# Patient Record
Sex: Male | Born: 1960 | Race: White | Hispanic: No | Smoking: Never smoker
Health system: Southern US, Community
[De-identification: ages and names within clinical notes are randomized; demographics above are authoritative.]

## PROBLEM LIST (undated history)

## (undated) DIAGNOSIS — J189 Pneumonia, unspecified organism: Secondary | ICD-10-CM

## (undated) DIAGNOSIS — K7290 Hepatic failure, unspecified without coma: Secondary | ICD-10-CM

## (undated) DIAGNOSIS — L0390 Cellulitis, unspecified: Secondary | ICD-10-CM

## (undated) DIAGNOSIS — K7460 Unspecified cirrhosis of liver: Secondary | ICD-10-CM

## (undated) DIAGNOSIS — K3189 Other diseases of stomach and duodenum: Secondary | ICD-10-CM

## (undated) DIAGNOSIS — D696 Thrombocytopenia, unspecified: Secondary | ICD-10-CM

## (undated) DIAGNOSIS — R569 Unspecified convulsions: Secondary | ICD-10-CM

## (undated) DIAGNOSIS — I872 Venous insufficiency (chronic) (peripheral): Secondary | ICD-10-CM

## (undated) DIAGNOSIS — N179 Acute kidney failure, unspecified: Secondary | ICD-10-CM

## (undated) DIAGNOSIS — F418 Other specified anxiety disorders: Secondary | ICD-10-CM

## (undated) DIAGNOSIS — S065X9A Traumatic subdural hemorrhage with loss of consciousness of unspecified duration, initial encounter: Secondary | ICD-10-CM

## (undated) DIAGNOSIS — N200 Calculus of kidney: Secondary | ICD-10-CM

## (undated) DIAGNOSIS — D689 Coagulation defect, unspecified: Secondary | ICD-10-CM

## (undated) DIAGNOSIS — Z9289 Personal history of other medical treatment: Secondary | ICD-10-CM

## (undated) DIAGNOSIS — K766 Portal hypertension: Secondary | ICD-10-CM

## (undated) DIAGNOSIS — B1920 Unspecified viral hepatitis C without hepatic coma: Secondary | ICD-10-CM

## (undated) DIAGNOSIS — R188 Other ascites: Secondary | ICD-10-CM

## (undated) DIAGNOSIS — D649 Anemia, unspecified: Secondary | ICD-10-CM

## (undated) DIAGNOSIS — K922 Gastrointestinal hemorrhage, unspecified: Secondary | ICD-10-CM

## (undated) HISTORY — PX: ABDOMINAL HERNIA REPAIR: SHX539

## (undated) HISTORY — PX: TONSILLECTOMY: SUR1361

## (undated) HISTORY — PX: APPENDECTOMY: SHX54

---

## 2003-05-03 ENCOUNTER — Emergency Department (HOSPITAL_COMMUNITY): Admission: EM | Admit: 2003-05-03 | Discharge: 2003-05-04 | Payer: Self-pay | Admitting: Emergency Medicine

## 2003-11-29 ENCOUNTER — Emergency Department (HOSPITAL_COMMUNITY): Admission: EM | Admit: 2003-11-29 | Discharge: 2003-11-29 | Payer: Self-pay | Admitting: Family Medicine

## 2011-03-17 DIAGNOSIS — K3189 Other diseases of stomach and duodenum: Secondary | ICD-10-CM

## 2011-03-17 DIAGNOSIS — K922 Gastrointestinal hemorrhage, unspecified: Secondary | ICD-10-CM

## 2011-03-17 DIAGNOSIS — D689 Coagulation defect, unspecified: Secondary | ICD-10-CM

## 2011-03-17 DIAGNOSIS — D696 Thrombocytopenia, unspecified: Secondary | ICD-10-CM

## 2011-03-17 HISTORY — DX: Coagulation defect, unspecified: D68.9

## 2011-03-17 HISTORY — DX: Gastrointestinal hemorrhage, unspecified: K92.2

## 2011-03-17 HISTORY — DX: Thrombocytopenia, unspecified: D69.6

## 2011-03-17 HISTORY — DX: Other diseases of stomach and duodenum: K31.89

## 2011-04-08 LAB — TROPONIN I: Troponin-I: 0.02 ng/mL

## 2011-04-08 LAB — CBC
MCH: 33.8 pg (ref 26.0–34.0)
MCV: 99 fL (ref 80–100)
Platelet: 103 10*3/uL — ABNORMAL LOW (ref 150–440)
RBC: 3.4 10*6/uL — ABNORMAL LOW (ref 4.40–5.90)
RDW: 17.7 % — ABNORMAL HIGH (ref 11.5–14.5)

## 2011-04-08 LAB — COMPREHENSIVE METABOLIC PANEL
Alkaline Phosphatase: 93 U/L (ref 50–136)
BUN: 15 mg/dL (ref 7–18)
Bilirubin,Total: 2.3 mg/dL — ABNORMAL HIGH (ref 0.2–1.0)
Calcium, Total: 7.5 mg/dL — ABNORMAL LOW (ref 8.5–10.1)
Chloride: 108 mmol/L — ABNORMAL HIGH (ref 98–107)
Co2: 19 mmol/L — ABNORMAL LOW (ref 21–32)
Creatinine: 1.89 mg/dL — ABNORMAL HIGH (ref 0.60–1.30)
EGFR (African American): 48 — ABNORMAL LOW
EGFR (Non-African Amer.): 40 — ABNORMAL LOW
Glucose: 99 mg/dL (ref 65–99)
Osmolality: 286 (ref 275–301)
SGOT(AST): 310 U/L — ABNORMAL HIGH (ref 15–37)
SGPT (ALT): 138 U/L — ABNORMAL HIGH
Sodium: 143 mmol/L (ref 136–145)

## 2011-04-08 LAB — LIPASE, BLOOD: Lipase: 356 U/L (ref 73–393)

## 2011-04-08 LAB — PROTIME-INR
INR: 1.4
Prothrombin Time: 17.4 secs — ABNORMAL HIGH (ref 11.5–14.7)

## 2011-04-09 ENCOUNTER — Inpatient Hospital Stay: Payer: Self-pay | Admitting: Internal Medicine

## 2011-04-09 LAB — URINALYSIS, COMPLETE
Bacteria: NONE SEEN
Bilirubin,UR: NEGATIVE
Blood: NEGATIVE
Glucose,UR: NEGATIVE mg/dL (ref 0–75)
Leukocyte Esterase: NEGATIVE
Nitrite: NEGATIVE
Ph: 5 (ref 4.5–8.0)
Protein: NEGATIVE
RBC,UR: NONE SEEN /HPF (ref 0–5)
Squamous Epithelial: 2

## 2011-04-09 LAB — HEMOGLOBIN
HGB: 8.2 g/dL — ABNORMAL LOW (ref 13.0–18.0)
HGB: 7.4 g/dL — ABNORMAL LOW (ref 13.0–18.0)
HGB: 8.7 g/dL — ABNORMAL LOW (ref 13.0–18.0)

## 2011-04-09 LAB — BASIC METABOLIC PANEL
Anion Gap: 11 (ref 7–16)
Calcium, Total: 6.1 mg/dL — CL (ref 8.5–10.1)
Co2: 19 mmol/L — ABNORMAL LOW (ref 21–32)
Creatinine: 1.48 mg/dL — ABNORMAL HIGH (ref 0.60–1.30)
EGFR (African American): >60
EGFR (Non-African Amer.): 53 — ABNORMAL LOW
Glucose: 82 mg/dL (ref 65–99)

## 2011-04-09 LAB — ETHANOL: Ethanol: 38 mg/dL

## 2011-04-10 HISTORY — PX: ESOPHAGOGASTRODUODENOSCOPY: SHX1529

## 2011-04-10 LAB — COMPREHENSIVE METABOLIC PANEL
Albumin: 2.2 g/dL — ABNORMAL LOW (ref 3.4–5.0)
Alkaline Phosphatase: 70 U/L (ref 50–136)
BUN: 18 mg/dL (ref 7–18)
Bilirubin,Total: 4.6 mg/dL — ABNORMAL HIGH (ref 0.2–1.0)
Calcium, Total: 6.6 mg/dL — CL (ref 8.5–10.1)
Chloride: 113 mmol/L — ABNORMAL HIGH (ref 98–107)
Co2: 25 mmol/L (ref 21–32)
Creatinine: 0.99 mg/dL (ref 0.60–1.30)
EGFR (African American): >60
EGFR (Non-African Amer.): >60
Glucose: 116 mg/dL — ABNORMAL HIGH (ref 65–99)
Osmolality: 293 (ref 275–301)
Potassium: 4.1 mmol/L (ref 3.5–5.1)
SGPT (ALT): 103 U/L — ABNORMAL HIGH

## 2011-05-11 ENCOUNTER — Ambulatory Visit: Payer: Self-pay | Admitting: Gastroenterology

## 2011-05-31 ENCOUNTER — Ambulatory Visit: Payer: Self-pay | Admitting: Gastroenterology

## 2011-09-27 ENCOUNTER — Ambulatory Visit: Payer: Self-pay | Admitting: Gastroenterology

## 2012-01-17 DIAGNOSIS — R188 Other ascites: Secondary | ICD-10-CM

## 2012-01-17 HISTORY — DX: Other ascites: R18.8

## 2012-03-28 ENCOUNTER — Emergency Department: Payer: Self-pay | Admitting: Emergency Medicine

## 2012-03-28 LAB — RAPID INFLUENZA A&B ANTIGENS

## 2012-09-18 ENCOUNTER — Inpatient Hospital Stay: Payer: Self-pay | Admitting: Specialist

## 2012-09-18 LAB — COMPREHENSIVE METABOLIC PANEL
Albumin: 2.5 g/dL — ABNORMAL LOW (ref 3.4–5.0)
Alkaline Phosphatase: 179 U/L — ABNORMAL HIGH (ref 50–136)
BUN: 10 mg/dL (ref 7–18)
Bilirubin,Total: 3.1 mg/dL — ABNORMAL HIGH (ref 0.2–1.0)
Chloride: 109 mmol/L — ABNORMAL HIGH (ref 98–107)
EGFR (African American): >60
EGFR (Non-African Amer.): >60
Osmolality: 279 (ref 275–301)
Potassium: 4.3 mmol/L (ref 3.5–5.1)
Sodium: 140 mmol/L (ref 136–145)
Total Protein: 5.9 g/dL — ABNORMAL LOW (ref 6.4–8.2)

## 2012-09-18 LAB — CBC WITH DIFFERENTIAL/PLATELET
Basophil #: 0.1 10*3/uL (ref 0.0–0.1)
Basophil %: 1.3 %
Eosinophil %: 3.2 %
HGB: 13.3 g/dL (ref 13.0–18.0)
Lymphocyte #: 1.1 10*3/uL (ref 1.0–3.6)
MCH: 35.1 pg — ABNORMAL HIGH (ref 26.0–34.0)
MCV: 102 fL — ABNORMAL HIGH (ref 80–100)
Monocyte %: 18.4 %
Neutrophil #: 1.8 10*3/uL (ref 1.4–6.5)
Neutrophil %: 48.8 %
Platelet: 39 10*3/uL — ABNORMAL LOW (ref 150–440)
RBC: 3.8 10*6/uL — ABNORMAL LOW (ref 4.40–5.90)
RDW: 18.1 % — ABNORMAL HIGH (ref 11.5–14.5)
WBC: 3.8 10*3/uL (ref 3.8–10.6)

## 2012-09-18 LAB — URINALYSIS, COMPLETE
Bacteria: NONE SEEN
Hyaline Cast: 2
Ketone: NEGATIVE
Leukocyte Esterase: NEGATIVE
Ph: 6 (ref 4.5–8.0)
Protein: NEGATIVE
Specific Gravity: 1.02 (ref 1.003–1.030)
WBC UR: <1 /HPF (ref 0–5)

## 2012-09-19 LAB — RAPID HIV-1/2 QL/CONFIRM: HIV-1/2,Rapid Ql: NEGATIVE

## 2012-09-19 LAB — TSH: Thyroid Stimulating Horm: 2.35 u[IU]/mL

## 2012-09-20 LAB — COMPREHENSIVE METABOLIC PANEL
Albumin: 2.3 g/dL — ABNORMAL LOW (ref 3.4–5.0)
Alkaline Phosphatase: 142 U/L — ABNORMAL HIGH (ref 50–136)
Anion Gap: 5 — ABNORMAL LOW (ref 7–16)
BUN: 10 mg/dL (ref 7–18)
Co2: 25 mmol/L (ref 21–32)
Creatinine: 0.85 mg/dL (ref 0.60–1.30)
EGFR (African American): >60
EGFR (Non-African Amer.): >60
Glucose: 82 mg/dL (ref 65–99)
Osmolality: 274 (ref 275–301)
SGOT(AST): 56 U/L — ABNORMAL HIGH (ref 15–37)
Sodium: 138 mmol/L (ref 136–145)
Total Protein: 5.2 g/dL — ABNORMAL LOW (ref 6.4–8.2)

## 2012-09-23 LAB — CULTURE, BLOOD (SINGLE)

## 2012-09-24 ENCOUNTER — Emergency Department: Payer: Self-pay | Admitting: Emergency Medicine

## 2012-09-24 LAB — URINALYSIS, COMPLETE
Blood: NEGATIVE
Glucose,UR: NEGATIVE mg/dL (ref 0–75)
Hyaline Cast: 19
Ketone: NEGATIVE
Leukocyte Esterase: NEGATIVE
Ph: 5 (ref 4.5–8.0)
RBC,UR: 1 /HPF (ref 0–5)
Specific Gravity: 1.026 (ref 1.003–1.030)
Squamous Epithelial: NONE SEEN
WBC UR: <1 /HPF (ref 0–5)

## 2012-09-24 LAB — AMMONIA: Ammonia, Plasma: <25 mcmol/L (ref 11–32)

## 2012-09-24 LAB — DRUG SCREEN, URINE
Barbiturates, Ur Screen: NEGATIVE (ref ?–200)
Benzodiazepine, Ur Scrn: NEGATIVE (ref ?–200)
Cannabinoid 50 Ng, Ur ~~LOC~~: POSITIVE (ref ?–50)
MDMA (Ecstasy)Ur Screen: NEGATIVE (ref ?–500)
Methadone, Ur Screen: NEGATIVE (ref ?–300)
Opiate, Ur Screen: NEGATIVE (ref ?–300)
Phencyclidine (PCP) Ur S: NEGATIVE (ref ?–25)
Tricyclic, Ur Screen: NEGATIVE (ref ?–1000)

## 2012-09-24 LAB — COMPREHENSIVE METABOLIC PANEL
Albumin: 3.2 g/dL — ABNORMAL LOW (ref 3.4–5.0)
Alkaline Phosphatase: 177 U/L — ABNORMAL HIGH (ref 50–136)
Anion Gap: 7 (ref 7–16)
Creatinine: 0.8 mg/dL (ref 0.60–1.30)
EGFR (African American): >60
EGFR (Non-African Amer.): >60
Glucose: 70 mg/dL (ref 65–99)
SGOT(AST): 124 U/L — ABNORMAL HIGH (ref 15–37)
Sodium: 140 mmol/L (ref 136–145)
Total Protein: 6.5 g/dL (ref 6.4–8.2)

## 2012-09-24 LAB — CBC
HCT: 38.3 % — ABNORMAL LOW (ref 40.0–52.0)
HGB: 13 g/dL (ref 13.0–18.0)
MCH: 34.6 pg — ABNORMAL HIGH (ref 26.0–34.0)
MCHC: 33.8 g/dL (ref 32.0–36.0)
MCV: 102 fL — ABNORMAL HIGH (ref 80–100)
Platelet: 40 10*3/uL — ABNORMAL LOW (ref 150–440)
RDW: 18.1 % — ABNORMAL HIGH (ref 11.5–14.5)
WBC: 4.5 10*3/uL (ref 3.8–10.6)

## 2012-09-24 LAB — TSH: Thyroid Stimulating Horm: 4.37 u[IU]/mL

## 2012-09-24 LAB — ETHANOL: Ethanol: <3 mg/dL

## 2012-09-25 LAB — DRUG SCREEN, URINE
Barbiturates, Ur Screen: NEGATIVE (ref ?–200)
Cannabinoid 50 Ng, Ur ~~LOC~~: POSITIVE (ref ?–50)
MDMA (Ecstasy)Ur Screen: NEGATIVE (ref ?–500)
Methadone, Ur Screen: NEGATIVE (ref ?–300)
Phencyclidine (PCP) Ur S: NEGATIVE (ref ?–25)

## 2012-09-25 LAB — COMPREHENSIVE METABOLIC PANEL
Albumin: 3.1 g/dL — ABNORMAL LOW (ref 3.4–5.0)
Alkaline Phosphatase: 163 U/L — ABNORMAL HIGH (ref 50–136)
Anion Gap: 8 (ref 7–16)
BUN: 15 mg/dL (ref 7–18)
Chloride: 108 mmol/L — ABNORMAL HIGH (ref 98–107)
Co2: 24 mmol/L (ref 21–32)
Creatinine: 1.24 mg/dL (ref 0.60–1.30)
EGFR (African American): >60
EGFR (Non-African Amer.): >60
Glucose: 57 mg/dL — ABNORMAL LOW (ref 65–99)
Potassium: 4.1 mmol/L (ref 3.5–5.1)
SGPT (ALT): 52 U/L (ref 12–78)
Sodium: 140 mmol/L (ref 136–145)

## 2012-09-25 LAB — CBC
HGB: 12.2 g/dL — ABNORMAL LOW (ref 13.0–18.0)
MCH: 35.1 pg — ABNORMAL HIGH (ref 26.0–34.0)
MCHC: 34.7 g/dL (ref 32.0–36.0)
MCV: 101 fL — ABNORMAL HIGH (ref 80–100)
Platelet: 40 10*3/uL — ABNORMAL LOW (ref 150–440)
RDW: 17.8 % — ABNORMAL HIGH (ref 11.5–14.5)
WBC: 4.5 10*3/uL (ref 3.8–10.6)

## 2012-09-25 LAB — URINALYSIS, COMPLETE
Bacteria: NONE SEEN
Hyaline Cast: 51
Leukocyte Esterase: NEGATIVE
Ph: 5 (ref 4.5–8.0)
RBC,UR: 2 /HPF (ref 0–5)
Specific Gravity: 1.025 (ref 1.003–1.030)
Squamous Epithelial: NONE SEEN

## 2012-09-25 LAB — BILIRUBIN, DIRECT: Bilirubin, Direct: 2.3 mg/dL — ABNORMAL HIGH (ref 0.00–0.20)

## 2012-09-25 LAB — TSH: Thyroid Stimulating Horm: 2.93 u[IU]/mL

## 2012-09-25 LAB — AMMONIA: Ammonia, Plasma: <25 mcmol/L (ref 11–32)

## 2012-09-26 ENCOUNTER — Inpatient Hospital Stay: Payer: Self-pay | Admitting: Internal Medicine

## 2012-09-28 LAB — BASIC METABOLIC PANEL
Anion Gap: 5 — ABNORMAL LOW (ref 7–16)
BUN: 10 mg/dL (ref 7–18)
Calcium, Total: 8.2 mg/dL — ABNORMAL LOW (ref 8.5–10.1)
Co2: 26 mmol/L (ref 21–32)
EGFR (African American): >60
EGFR (Non-African Amer.): >60
Glucose: 74 mg/dL (ref 65–99)
Osmolality: 283 (ref 275–301)
Sodium: 143 mmol/L (ref 136–145)

## 2012-09-28 LAB — CBC WITH DIFFERENTIAL/PLATELET
Basophil #: 0 10*3/uL (ref 0.0–0.1)
Eosinophil %: 4.5 %
HCT: 33.7 % — ABNORMAL LOW (ref 40.0–52.0)
Lymphocyte %: 37.8 %
MCHC: 34 g/dL (ref 32.0–36.0)
Monocyte #: 0.6 x10 3/mm (ref 0.2–1.0)
Monocyte %: 18.8 %
RBC: 3.27 10*6/uL — ABNORMAL LOW (ref 4.40–5.90)

## 2012-09-29 LAB — CBC WITH DIFFERENTIAL/PLATELET
Basophil %: 1.1 %
Eosinophil #: 0.1 10*3/uL (ref 0.0–0.7)
Eosinophil %: 4.5 %
HCT: 36.5 % — ABNORMAL LOW (ref 40.0–52.0)
HGB: 12.3 g/dL — ABNORMAL LOW (ref 13.0–18.0)
Lymphocyte #: 1.3 10*3/uL (ref 1.0–3.6)
MCH: 34.6 pg — ABNORMAL HIGH (ref 26.0–34.0)
MCHC: 33.7 g/dL (ref 32.0–36.0)
MCV: 103 fL — ABNORMAL HIGH (ref 80–100)
Monocyte #: 0.5 x10 3/mm (ref 0.2–1.0)
Monocyte %: 17.1 %
Neutrophil #: 1.1 10*3/uL — ABNORMAL LOW (ref 1.4–6.5)
Neutrophil %: 35.6 %
Platelet: 32 10*3/uL — ABNORMAL LOW (ref 150–440)
RBC: 3.55 10*6/uL — ABNORMAL LOW (ref 4.40–5.90)
RDW: 17.3 % — ABNORMAL HIGH (ref 11.5–14.5)
WBC: 3.2 10*3/uL — ABNORMAL LOW (ref 3.8–10.6)

## 2012-09-29 LAB — BASIC METABOLIC PANEL
Anion Gap: 7 (ref 7–16)
BUN: 11 mg/dL (ref 7–18)
Calcium, Total: 8.4 mg/dL — ABNORMAL LOW (ref 8.5–10.1)
Chloride: 110 mmol/L — ABNORMAL HIGH (ref 98–107)
Creatinine: 0.8 mg/dL (ref 0.60–1.30)
EGFR (African American): >60
EGFR (Non-African Amer.): >60
Osmolality: 282 (ref 275–301)
Potassium: 4.1 mmol/L (ref 3.5–5.1)
Sodium: 143 mmol/L (ref 136–145)

## 2012-10-01 LAB — AMMONIA: Ammonia, Plasma: 28 mcmol/L (ref 11–32)

## 2012-10-08 ENCOUNTER — Emergency Department: Payer: Self-pay | Admitting: Emergency Medicine

## 2012-10-08 LAB — CBC
HCT: 34.6 % — ABNORMAL LOW (ref 40.0–52.0)
MCH: 34.8 pg — ABNORMAL HIGH (ref 26.0–34.0)
MCHC: 34.7 g/dL (ref 32.0–36.0)
Platelet: 45 10*3/uL — ABNORMAL LOW (ref 150–440)
RBC: 3.45 10*6/uL — ABNORMAL LOW (ref 4.40–5.90)
RDW: 18 % — ABNORMAL HIGH (ref 11.5–14.5)
WBC: 3.9 10*3/uL (ref 3.8–10.6)

## 2012-10-08 LAB — DRUG SCREEN, URINE
Amphetamines, Ur Screen: NEGATIVE (ref ?–1000)
Benzodiazepine, Ur Scrn: NEGATIVE (ref ?–200)
Cannabinoid 50 Ng, Ur ~~LOC~~: POSITIVE (ref ?–50)
Cocaine Metabolite,Ur ~~LOC~~: NEGATIVE (ref ?–300)
Methadone, Ur Screen: NEGATIVE (ref ?–300)
Phencyclidine (PCP) Ur S: NEGATIVE (ref ?–25)
Tricyclic, Ur Screen: NEGATIVE (ref ?–1000)

## 2012-10-08 LAB — URINALYSIS, COMPLETE
Blood: NEGATIVE
Glucose,UR: NEGATIVE mg/dL (ref 0–75)
Hyaline Cast: 6
Leukocyte Esterase: NEGATIVE
Nitrite: NEGATIVE
Ph: 5 (ref 4.5–8.0)
Protein: 30
Specific Gravity: 1.028 (ref 1.003–1.030)
Squamous Epithelial: <1
WBC UR: 2 /HPF (ref 0–5)

## 2012-10-08 LAB — COMPREHENSIVE METABOLIC PANEL
Albumin: 2.6 g/dL — ABNORMAL LOW (ref 3.4–5.0)
Alkaline Phosphatase: 164 U/L — ABNORMAL HIGH (ref 50–136)
Anion Gap: 7 (ref 7–16)
BUN: 13 mg/dL (ref 7–18)
Bilirubin,Total: 3.6 mg/dL — ABNORMAL HIGH (ref 0.2–1.0)
Calcium, Total: 8.5 mg/dL (ref 8.5–10.1)
Chloride: 109 mmol/L — ABNORMAL HIGH (ref 98–107)
EGFR (African American): >60
EGFR (Non-African Amer.): >60
Glucose: 127 mg/dL — ABNORMAL HIGH (ref 65–99)
Osmolality: 281 (ref 275–301)
Potassium: 3.7 mmol/L (ref 3.5–5.1)
SGOT(AST): 88 U/L — ABNORMAL HIGH (ref 15–37)

## 2012-10-08 LAB — ETHANOL
Ethanol %: <0.003 % (ref 0.000–0.080)
Ethanol: <3 mg/dL

## 2012-10-08 LAB — TSH: Thyroid Stimulating Horm: 2.32 u[IU]/mL

## 2012-10-08 LAB — ACETAMINOPHEN LEVEL: Acetaminophen: 3 ug/mL — ABNORMAL LOW

## 2012-10-08 LAB — SALICYLATE LEVEL: Salicylates, Serum: <1.7 mg/dL

## 2013-01-16 DIAGNOSIS — R569 Unspecified convulsions: Secondary | ICD-10-CM

## 2013-01-16 HISTORY — DX: Unspecified convulsions: R56.9

## 2013-02-21 DIAGNOSIS — B1920 Unspecified viral hepatitis C without hepatic coma: Secondary | ICD-10-CM | POA: Insufficient documentation

## 2013-02-21 DIAGNOSIS — K7460 Unspecified cirrhosis of liver: Secondary | ICD-10-CM | POA: Insufficient documentation

## 2013-07-20 ENCOUNTER — Emergency Department (HOSPITAL_COMMUNITY)
Admission: EM | Admit: 2013-07-20 | Discharge: 2013-07-21 | Disposition: A | Discharge status: Home / Self Care | Payer: Medicaid Other | Attending: Emergency Medicine | Admitting: Emergency Medicine

## 2013-07-20 ENCOUNTER — Encounter (HOSPITAL_COMMUNITY): Payer: Self-pay | Admitting: Emergency Medicine

## 2013-07-20 DIAGNOSIS — E870 Hyperosmolality and hypernatremia: Secondary | ICD-10-CM | POA: Diagnosis not present

## 2013-07-20 DIAGNOSIS — F1010 Alcohol abuse, uncomplicated: Secondary | ICD-10-CM | POA: Insufficient documentation

## 2013-07-20 DIAGNOSIS — F10920 Alcohol use, unspecified with intoxication, uncomplicated: Secondary | ICD-10-CM

## 2013-07-20 HISTORY — DX: Unspecified cirrhosis of liver: K74.60

## 2013-07-20 LAB — I-STAT CHEM 8, ED
BUN: 4 mg/dL — AB (ref 6–23)
BUN: 4 mg/dL — ABNORMAL LOW (ref 6–23)
CALCIUM ION: 1.04 mmol/L — AB (ref 1.12–1.23)
CHLORIDE: 104 meq/L (ref 96–112)
CREATININE: 1.1 mg/dL (ref 0.50–1.35)
Calcium, Ion: 1.06 mmol/L — ABNORMAL LOW (ref 1.12–1.23)
Chloride: 106 mEq/L (ref 96–112)
Creatinine, Ser: 1.2 mg/dL (ref 0.50–1.35)
GLUCOSE: 154 mg/dL — AB (ref 70–99)
Glucose, Bld: 88 mg/dL (ref 70–99)
HCT: 41 % (ref 39.0–52.0)
HCT: 40 % (ref 39.0–52.0)
HEMOGLOBIN: 13.9 g/dL (ref 13.0–17.0)
Hemoglobin: 13.6 g/dL (ref 13.0–17.0)
POTASSIUM: 3.8 meq/L (ref 3.7–5.3)
Potassium: 4.5 mEq/L (ref 3.7–5.3)
SODIUM: 150 meq/L — AB (ref 137–147)
Sodium: 150 mEq/L — ABNORMAL HIGH (ref 137–147)
TCO2: 25 mmol/L (ref 0–100)
TCO2: 29 mmol/L (ref 0–100)

## 2013-07-20 LAB — ETHANOL
ALCOHOL ETHYL (B): 371 mg/dL — AB (ref 0–11)
ALCOHOL ETHYL (B): 326 mg/dL — AB (ref 0–11)

## 2013-07-20 MED ORDER — SODIUM CHLORIDE 0.9 % IV BOLUS (SEPSIS)
1000.0000 mL | Freq: Once | INTRAVENOUS | Status: AC
  Administered 2013-07-20: 1000 mL via INTRAVENOUS

## 2013-07-20 MED ORDER — DEXTROSE-NACL 5-0.45 % IV SOLN
INTRAVENOUS | Status: DC
  Administered 2013-07-20: 23:00:00 via INTRAVENOUS
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
## 2013-07-20 NOTE — ED Notes (Signed)
Pt states he drank several beers at noon today

## 2013-07-20 NOTE — ED Notes (Signed)
Pt resting with eye closed, RR even and unlabored.

## 2013-07-20 NOTE — ED Notes (Signed)
Pt ambulatory with 1 person assist to BR, pt sat up and ate sandwich and cheese, consumed soft drink with no difficulties
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
## 2013-07-21 NOTE — ED Notes (Signed)
Pt was unable to find anyone to come and get him until morning. Pt has eaten twice and had several beverages in last few hours. Pt can stay in [facility] until he is able to find ride home. Charge nurse aware.

## 2013-07-21 NOTE — Discharge Instructions (Signed)
Return to the ED with any concerns including vomiting and not able to keep down liquids, difficulty breathing, decreased level of alertness/lethargy, or any other alarming symptoms

## 2013-10-16 DIAGNOSIS — S065X9A Traumatic subdural hemorrhage with loss of consciousness of unspecified duration, initial encounter: Secondary | ICD-10-CM

## 2013-10-16 DIAGNOSIS — S065XAA Traumatic subdural hemorrhage with loss of consciousness status unknown, initial encounter: Secondary | ICD-10-CM

## 2013-10-16 DIAGNOSIS — K7290 Hepatic failure, unspecified without coma: Secondary | ICD-10-CM

## 2013-10-16 DIAGNOSIS — K7682 Hepatic encephalopathy: Secondary | ICD-10-CM

## 2013-10-16 HISTORY — DX: Hepatic encephalopathy: K76.82

## 2013-10-16 HISTORY — DX: Traumatic subdural hemorrhage with loss of consciousness of unspecified duration, initial encounter: S06.5X9A

## 2013-10-16 HISTORY — DX: Hepatic failure, unspecified without coma: K72.90

## 2013-10-16 HISTORY — DX: Traumatic subdural hemorrhage with loss of consciousness status unknown, initial encounter: S06.5XAA

## 2013-10-21 ENCOUNTER — Inpatient Hospital Stay (HOSPITAL_COMMUNITY)
Admission: EM | Admit: 2013-10-21 | Discharge: 2013-10-25 | DRG: 025 | Disposition: A | Discharge status: Home / Self Care | Payer: Medicaid Other | Source: Other Acute Inpatient Hospital | Attending: Internal Medicine | Admitting: Internal Medicine

## 2013-10-21 ENCOUNTER — Encounter (HOSPITAL_COMMUNITY)
Admission: EM | Disposition: A | Discharge status: Home / Self Care | Payer: Self-pay | Source: Other Acute Inpatient Hospital | Attending: Internal Medicine

## 2013-10-21 ENCOUNTER — Encounter (HOSPITAL_COMMUNITY): Payer: Self-pay | Admitting: General Practice

## 2013-10-21 ENCOUNTER — Inpatient Hospital Stay (HOSPITAL_COMMUNITY): Payer: Medicaid Other

## 2013-10-21 ENCOUNTER — Inpatient Hospital Stay (HOSPITAL_COMMUNITY): Payer: Medicaid Other | Admitting: Anesthesiology

## 2013-10-21 ENCOUNTER — Encounter (HOSPITAL_COMMUNITY): Payer: Medicaid Other | Admitting: Anesthesiology

## 2013-10-21 ENCOUNTER — Emergency Department: Payer: Self-pay | Admitting: Emergency Medicine

## 2013-10-21 DIAGNOSIS — E870 Hyperosmolality and hypernatremia: Secondary | ICD-10-CM | POA: Diagnosis present

## 2013-10-21 DIAGNOSIS — R001 Bradycardia, unspecified: Secondary | ICD-10-CM | POA: Diagnosis present

## 2013-10-21 DIAGNOSIS — K7460 Unspecified cirrhosis of liver: Secondary | ICD-10-CM

## 2013-10-21 DIAGNOSIS — F1020 Alcohol dependence, uncomplicated: Secondary | ICD-10-CM | POA: Diagnosis present

## 2013-10-21 DIAGNOSIS — E876 Hypokalemia: Secondary | ICD-10-CM | POA: Diagnosis not present

## 2013-10-21 DIAGNOSIS — K7030 Alcoholic cirrhosis of liver without ascites: Secondary | ICD-10-CM | POA: Diagnosis present

## 2013-10-21 DIAGNOSIS — S065XAA Traumatic subdural hemorrhage with loss of consciousness status unknown, initial encounter: Secondary | ICD-10-CM | POA: Diagnosis present

## 2013-10-21 DIAGNOSIS — R791 Abnormal coagulation profile: Secondary | ICD-10-CM | POA: Diagnosis present

## 2013-10-21 DIAGNOSIS — Y909 Presence of alcohol in blood, level not specified: Secondary | ICD-10-CM | POA: Diagnosis present

## 2013-10-21 DIAGNOSIS — I6200 Nontraumatic subdural hemorrhage, unspecified: Secondary | ICD-10-CM

## 2013-10-21 DIAGNOSIS — B1920 Unspecified viral hepatitis C without hepatic coma: Secondary | ICD-10-CM | POA: Diagnosis present

## 2013-10-21 DIAGNOSIS — D696 Thrombocytopenia, unspecified: Secondary | ICD-10-CM | POA: Diagnosis present

## 2013-10-21 DIAGNOSIS — S065X9A Traumatic subdural hemorrhage with loss of consciousness of unspecified duration, initial encounter: Secondary | ICD-10-CM | POA: Diagnosis present

## 2013-10-21 DIAGNOSIS — G92 Toxic encephalopathy: Secondary | ICD-10-CM | POA: Diagnosis present

## 2013-10-21 DIAGNOSIS — G8194 Hemiplegia, unspecified affecting left nondominant side: Secondary | ICD-10-CM | POA: Diagnosis present

## 2013-10-21 DIAGNOSIS — F1410 Cocaine abuse, uncomplicated: Secondary | ICD-10-CM | POA: Diagnosis present

## 2013-10-21 DIAGNOSIS — R739 Hyperglycemia, unspecified: Secondary | ICD-10-CM | POA: Diagnosis present

## 2013-10-21 HISTORY — PX: CRANIOTOMY: SHX93

## 2013-10-21 LAB — COMPREHENSIVE METABOLIC PANEL
ALT: 28 U/L (ref 0–53)
ANION GAP: 11 (ref 5–15)
AST: 62 U/L — ABNORMAL HIGH (ref 0–37)
Albumin: 2.7 g/dL — ABNORMAL LOW (ref 3.4–5.0)
Albumin: 2.6 g/dL — ABNORMAL LOW (ref 3.5–5.2)
Alkaline Phosphatase: 126 U/L — ABNORMAL HIGH
Alkaline Phosphatase: 110 U/L (ref 39–117)
Anion Gap: 4 — ABNORMAL LOW (ref 7–16)
BUN: 16 mg/dL (ref 7–18)
BUN: 14 mg/dL (ref 6–23)
Bilirubin,Total: 5.2 mg/dL — ABNORMAL HIGH (ref 0.2–1.0)
CALCIUM: 8.4 mg/dL — AB (ref 8.5–10.1)
CALCIUM: 8.4 mg/dL (ref 8.4–10.5)
CO2: 26 mEq/L (ref 19–32)
CREATININE: 0.73 mg/dL (ref 0.50–1.35)
Chloride: 106 mmol/L (ref 98–107)
Chloride: 104 mEq/L (ref 96–112)
Co2: 27 mmol/L (ref 21–32)
Creatinine: 0.91 mg/dL (ref 0.60–1.30)
EGFR (Non-African Amer.): >60
GFR calc non Af Amer: >90 mL/min (ref >90)
GLUCOSE: 75 mg/dL (ref 70–99)
Glucose: 74 mg/dL (ref 65–99)
OSMOLALITY: 274 (ref 275–301)
POTASSIUM: 3.6 mmol/L (ref 3.5–5.1)
Potassium: 4.3 mEq/L (ref 3.7–5.3)
SGOT(AST): 83 U/L — ABNORMAL HIGH (ref 15–37)
SGPT (ALT): 41 U/L
Sodium: 137 mmol/L (ref 136–145)
Sodium: 141 mEq/L (ref 137–147)
TOTAL PROTEIN: 6 g/dL (ref 6.0–8.3)
Total Bilirubin: 4.5 mg/dL — ABNORMAL HIGH (ref 0.3–1.2)
Total Protein: 6.1 g/dL — ABNORMAL LOW (ref 6.4–8.2)

## 2013-10-21 LAB — CBC WITH DIFFERENTIAL/PLATELET
Basophils Absolute: 0 10*3/uL (ref 0.0–0.1)
Basophils Relative: 0 % (ref 0–1)
EOS PCT: 6 % — AB (ref 0–5)
Eosinophils Absolute: 0.3 10*3/uL (ref 0.0–0.7)
HEMATOCRIT: 37.2 % — AB (ref 39.0–52.0)
Hemoglobin: 12.8 g/dL — ABNORMAL LOW (ref 13.0–17.0)
LYMPHS PCT: 36 % (ref 12–46)
Lymphs Abs: 2.1 10*3/uL (ref 0.7–4.0)
MCH: 34.3 pg — ABNORMAL HIGH (ref 26.0–34.0)
MCHC: 34.4 g/dL (ref 30.0–36.0)
MCV: 99.7 fL (ref 78.0–100.0)
MONO ABS: 1.3 10*3/uL — AB (ref 0.1–1.0)
Monocytes Relative: 23 % — ABNORMAL HIGH (ref 3–12)
Neutro Abs: 2.1 10*3/uL (ref 1.7–7.7)
Neutrophils Relative %: 35 % — ABNORMAL LOW (ref 43–77)
Platelets: 57 10*3/uL — ABNORMAL LOW (ref 150–400)
RBC: 3.73 MIL/uL — ABNORMAL LOW (ref 4.22–5.81)
RDW: 14.7 % (ref 11.5–15.5)
WBC: 5.9 10*3/uL (ref 4.0–10.5)

## 2013-10-21 LAB — CBC
HCT: 42.2 % (ref 40.0–52.0)
HEMATOCRIT: 30.7 % — AB (ref 39.0–52.0)
HEMOGLOBIN: 10.6 g/dL — AB (ref 13.0–17.0)
HGB: 13.6 g/dL (ref 13.0–18.0)
MCH: 34.1 pg — ABNORMAL HIGH (ref 26.0–34.0)
MCH: 34.2 pg — ABNORMAL HIGH (ref 26.0–34.0)
MCHC: 32.3 g/dL (ref 32.0–36.0)
MCHC: 34.5 g/dL (ref 30.0–36.0)
MCV: 106 fL — ABNORMAL HIGH (ref 80–100)
MCV: 99 fL (ref 78.0–100.0)
Platelet: 68 10*3/uL — ABNORMAL LOW (ref 150–440)
Platelets: 75 10*3/uL — ABNORMAL LOW (ref 150–400)
RBC: 4 10*6/uL — AB (ref 4.40–5.90)
RBC: 3.1 MIL/uL — ABNORMAL LOW (ref 4.22–5.81)
RDW: 15 % — AB (ref 11.5–14.5)
RDW: 14.7 % (ref 11.5–15.5)
WBC: 6.8 10*3/uL (ref 3.8–10.6)
WBC: 5.3 10*3/uL (ref 4.0–10.5)

## 2013-10-21 LAB — PROTIME-INR
INR: 1.6
INR: 1.63 — ABNORMAL HIGH (ref 0.00–1.49)
Prothrombin Time: 18.3 secs — ABNORMAL HIGH (ref 11.5–14.7)
Prothrombin Time: 19.3 seconds — ABNORMAL HIGH (ref 11.6–15.2)

## 2013-10-21 LAB — URINALYSIS, ROUTINE W REFLEX MICROSCOPIC
Glucose, UA: NEGATIVE mg/dL
Hgb urine dipstick: NEGATIVE
Ketones, ur: 15 mg/dL — AB
Leukocytes, UA: NEGATIVE
NITRITE: NEGATIVE
PH: 6 (ref 5.0–8.0)
PROTEIN: NEGATIVE mg/dL
Specific Gravity, Urine: 1.025 (ref 1.005–1.030)
UROBILINOGEN UA: 4 mg/dL — AB (ref 0.0–1.0)

## 2013-10-21 LAB — TROPONIN I: Troponin-I: <0.02 ng/mL

## 2013-10-21 LAB — LACTIC ACID, PLASMA: Lactic Acid, Venous: 2 mmol/L (ref 0.5–2.2)

## 2013-10-21 LAB — MRSA PCR SCREENING: MRSA by PCR: NEGATIVE

## 2013-10-21 LAB — CK TOTAL AND CKMB (NOT AT ARMC)
CK, TOTAL: 223 U/L
CK-MB: 3.1 ng/mL (ref 0.5–3.6)

## 2013-10-21 LAB — APTT: aPTT: 36 seconds (ref 24–37)

## 2013-10-21 LAB — ETHANOL: Ethanol: <3 mg/dL

## 2013-10-21 LAB — ABO/RH: ABO/RH(D): A NEG

## 2013-10-21 LAB — AMYLASE: AMYLASE: 31 U/L (ref 0–105)

## 2013-10-21 LAB — LIPASE, BLOOD: LIPASE: 53 U/L (ref 11–59)

## 2013-10-21 SURGERY — CRANIOTOMY HEMATOMA EVACUATION SUBDURAL
Anesthesia: General | Site: Head | Laterality: Right

## 2013-10-21 MED ORDER — LABETALOL HCL 5 MG/ML IV SOLN: 10.0000 mg | INTRAVENOUS | Status: DC | PRN

## 2013-10-21 MED ORDER — FENTANYL CITRATE 0.05 MG/ML IJ SOLN
INTRAMUSCULAR | Status: DC | PRN
  Administered 2013-10-21: 150 ug via INTRAVENOUS

## 2013-10-21 MED ORDER — PHENYLEPHRINE HCL 10 MG/ML IJ SOLN
INTRAMUSCULAR | Status: DC | PRN
  Administered 2013-10-21 (×4): 80 ug via INTRAVENOUS

## 2013-10-21 MED ORDER — ONDANSETRON HCL 4 MG PO TABS: 4.0000 mg | ORAL_TABLET | ORAL | Status: DC | PRN

## 2013-10-21 MED ORDER — LIDOCAINE-EPINEPHRINE 1 %-1:100000 IJ SOLN
INTRAMUSCULAR | Status: DC | PRN
  Administered 2013-10-21: 3.5 mL via INTRADERMAL

## 2013-10-21 MED ORDER — GLYCOPYRROLATE 0.2 MG/ML IJ SOLN
INTRAMUSCULAR | Status: DC | PRN
  Administered 2013-10-21: 0.4 mg via INTRAVENOUS

## 2013-10-21 MED ORDER — HEMOSTATIC AGENTS (NO CHARGE) OPTIME
TOPICAL | Status: DC | PRN
  Administered 2013-10-21: 1 via TOPICAL

## 2013-10-21 MED ORDER — PROPOFOL 10 MG/ML IV BOLUS
INTRAVENOUS | Status: DC | PRN
  Administered 2013-10-21: 50 mg via INTRAVENOUS
  Administered 2013-10-21: 100 mg via INTRAVENOUS

## 2013-10-21 MED ORDER — SODIUM CHLORIDE 0.9 % IV SOLN: Freq: Once | INTRAVENOUS | Status: DC

## 2013-10-21 MED ORDER — POLYETHYLENE GLYCOL 3350 17 G PO PACK
17.0000 g | PACK | Freq: Every day | ORAL | Status: DC | PRN
  Filled 2013-10-21: qty 1

## 2013-10-21 MED ORDER — RIFAXIMIN 550 MG PO TABS
550.0000 mg | ORAL_TABLET | Freq: Two times a day (BID) | ORAL | Status: DC
  Administered 2013-10-22 – 2013-10-25 (×7): 550 mg via ORAL
  Filled 2013-10-21 (×9): qty 1

## 2013-10-21 MED ORDER — LIDOCAINE HCL (CARDIAC) 20 MG/ML IV SOLN
INTRAVENOUS | Status: DC | PRN
  Administered 2013-10-21: 10 mg via INTRAVENOUS

## 2013-10-21 MED ORDER — ONDANSETRON HCL 4 MG/2ML IJ SOLN: 4.0000 mg | INTRAMUSCULAR | Status: DC | PRN

## 2013-10-21 MED ORDER — THIAMINE HCL 100 MG/ML IJ SOLN
Freq: Once | INTRAVENOUS | Status: AC
  Administered 2013-10-21: 18:00:00 via INTRAVENOUS
  Filled 2013-10-21: qty 1000

## 2013-10-21 MED ORDER — BACITRACIN ZINC 500 UNIT/GM EX OINT
TOPICAL_OINTMENT | CUTANEOUS | Status: DC | PRN
  Administered 2013-10-21: 1 via TOPICAL

## 2013-10-21 MED ORDER — FENTANYL CITRATE 0.05 MG/ML IJ SOLN: 25.0000 ug | INTRAMUSCULAR | Status: DC | PRN

## 2013-10-21 MED ORDER — MORPHINE SULFATE 2 MG/ML IJ SOLN: 1.0000 mg | INTRAMUSCULAR | Status: DC | PRN

## 2013-10-21 MED ORDER — PANTOPRAZOLE SODIUM 40 MG IV SOLR
40.0000 mg | Freq: Every day | INTRAVENOUS | Status: DC
  Administered 2013-10-22: 40 mg via INTRAVENOUS
  Filled 2013-10-21 (×2): qty 40

## 2013-10-21 MED ORDER — EPHEDRINE SULFATE 50 MG/ML IJ SOLN
INTRAMUSCULAR | Status: DC | PRN
  Administered 2013-10-21: 10 mg via INTRAVENOUS

## 2013-10-21 MED ORDER — BISACODYL 10 MG RE SUPP: 10.0000 mg | Freq: Every day | RECTAL | Status: DC | PRN

## 2013-10-21 MED ORDER — ALBUMIN HUMAN 5 % IV SOLN
INTRAVENOUS | Status: DC | PRN
  Administered 2013-10-21: 22:00:00 via INTRAVENOUS

## 2013-10-21 MED ORDER — DOCUSATE SODIUM 100 MG PO CAPS
100.0000 mg | ORAL_CAPSULE | Freq: Two times a day (BID) | ORAL | Status: DC
  Administered 2013-10-22 – 2013-10-25 (×6): 100 mg via ORAL
  Filled 2013-10-21 (×9): qty 1

## 2013-10-21 MED ORDER — MIDAZOLAM HCL 2 MG/2ML IJ SOLN: 0.5000 mg | Freq: Once | INTRAMUSCULAR | Status: DC | PRN

## 2013-10-21 MED ORDER — PROMETHAZINE HCL 25 MG PO TABS
12.5000 mg | ORAL_TABLET | ORAL | Status: DC | PRN
Start: 2013-10-21 — End: 2013-10-25

## 2013-10-21 MED ORDER — ONDANSETRON HCL 4 MG/2ML IJ SOLN
INTRAMUSCULAR | Status: DC | PRN
  Administered 2013-10-21: 4 mg via INTRAVENOUS

## 2013-10-21 MED ORDER — ROCURONIUM BROMIDE 100 MG/10ML IV SOLN
INTRAVENOUS | Status: DC | PRN
  Administered 2013-10-21: 35 mg via INTRAVENOUS

## 2013-10-21 MED ORDER — SODIUM CHLORIDE 0.9 % IV SOLN: INTRAVENOUS | Status: DC

## 2013-10-21 MED ORDER — SERTRALINE HCL 20 MG/ML PO CONC
25.0000 mg | Freq: Every day | ORAL | Status: DC
  Filled 2013-10-21 (×2): qty 1.25

## 2013-10-21 MED ORDER — PROMETHAZINE HCL 25 MG/ML IJ SOLN: 6.2500 mg | INTRAMUSCULAR | Status: DC | PRN

## 2013-10-21 MED ORDER — SENNA 8.6 MG PO TABS
1.0000 | ORAL_TABLET | Freq: Two times a day (BID) | ORAL | Status: DC
  Administered 2013-10-22 – 2013-10-25 (×6): 8.6 mg via ORAL
  Filled 2013-10-21 (×9): qty 1

## 2013-10-21 MED ORDER — CEFAZOLIN SODIUM-DEXTROSE 2-3 GM-% IV SOLR
INTRAVENOUS | Status: DC | PRN
  Administered 2013-10-21: 2 g via INTRAVENOUS

## 2013-10-21 MED ORDER — INFLUENZA VAC SPLIT QUAD 0.5 ML IM SUSY
0.5000 mL | PREFILLED_SYRINGE | INTRAMUSCULAR | Status: AC
  Administered 2013-10-22: 0.5 mL via INTRAMUSCULAR
  Filled 2013-10-21: qty 0.5

## 2013-10-21 MED ORDER — POTASSIUM CHLORIDE IN NACL 20-0.9 MEQ/L-% IV SOLN
INTRAVENOUS | Status: DC
  Administered 2013-10-22: via INTRAVENOUS
  Filled 2013-10-21 (×2): qty 1000

## 2013-10-21 MED ORDER — VITAMIN K1 10 MG/ML IJ SOLN
5.0000 mg | Freq: Once | INTRAVENOUS | Status: AC
  Administered 2013-10-21: 5 mg via INTRAVENOUS
  Filled 2013-10-21: qty 0.5

## 2013-10-21 MED ORDER — CEFAZOLIN SODIUM-DEXTROSE 2-3 GM-% IV SOLR
INTRAVENOUS | Status: AC
  Filled 2013-10-21: qty 50

## 2013-10-21 MED ORDER — PHENYLEPHRINE HCL 10 MG/ML IJ SOLN
10.0000 mg | INTRAVENOUS | Status: DC | PRN
  Administered 2013-10-21: 50 ug/min via INTRAVENOUS

## 2013-10-21 MED ORDER — THROMBIN 5000 UNITS EX SOLR
CUTANEOUS | Status: DC | PRN
  Administered 2013-10-21 (×2): 5000 [IU] via TOPICAL

## 2013-10-21 MED ORDER — LEVETIRACETAM IN NACL 500 MG/100ML IV SOLN
500.0000 mg | Freq: Two times a day (BID) | INTRAVENOUS | Status: DC
  Administered 2013-10-21 – 2013-10-25 (×8): 500 mg via INTRAVENOUS
  Filled 2013-10-21 (×10): qty 100

## 2013-10-21 MED ORDER — 0.9 % SODIUM CHLORIDE (POUR BTL) OPTIME
TOPICAL | Status: DC | PRN
  Administered 2013-10-21 (×3): 1000 mL

## 2013-10-21 MED ORDER — LIDOCAINE HCL (CARDIAC) 20 MG/ML IV SOLN
INTRAVENOUS | Status: AC
  Filled 2013-10-21: qty 5

## 2013-10-21 MED ORDER — FLEET ENEMA 7-19 GM/118ML RE ENEM
1.0000 | ENEMA | Freq: Once | RECTAL | Status: AC | PRN
  Filled 2013-10-21: qty 1

## 2013-10-21 MED ORDER — SODIUM CHLORIDE 0.9 % IV SOLN
250.0000 mL | INTRAVENOUS | Status: DC | PRN
  Administered 2013-10-21 (×2): via INTRAVENOUS

## 2013-10-21 MED ORDER — LEVETIRACETAM IN NACL 500 MG/100ML IV SOLN: 500.0000 mg | Freq: Two times a day (BID) | INTRAVENOUS | Status: DC

## 2013-10-21 MED ORDER — HYDROCODONE-ACETAMINOPHEN 5-325 MG PO TABS
1.0000 | ORAL_TABLET | ORAL | Status: DC | PRN
  Administered 2013-10-21 – 2013-10-24 (×9): 1 via ORAL
  Filled 2013-10-21 (×10): qty 1

## 2013-10-21 MED ORDER — MEPERIDINE HCL 25 MG/ML IJ SOLN: 6.2500 mg | INTRAMUSCULAR | Status: DC | PRN

## 2013-10-21 MED ORDER — FENTANYL CITRATE 0.05 MG/ML IJ SOLN
INTRAMUSCULAR | Status: AC
  Filled 2013-10-21: qty 5

## 2013-10-21 MED ORDER — NEOSTIGMINE METHYLSULFATE 10 MG/10ML IV SOLN
INTRAVENOUS | Status: DC | PRN
  Administered 2013-10-21: 3 mg via INTRAVENOUS

## 2013-10-21 MED ORDER — PANTOPRAZOLE SODIUM 40 MG IV SOLR: 40.0000 mg | Freq: Every day | INTRAVENOUS | Status: DC

## 2013-10-21 MED ORDER — CEFAZOLIN SODIUM 1-5 GM-% IV SOLN
1.0000 g | Freq: Three times a day (TID) | INTRAVENOUS | Status: AC
  Administered 2013-10-22 (×2): 1 g via INTRAVENOUS
  Filled 2013-10-21 (×2): qty 50

## 2013-10-21 MED ORDER — MICROFIBRILLAR COLL HEMOSTAT EX PADS
MEDICATED_PAD | CUTANEOUS | Status: DC | PRN
  Administered 2013-10-21: 1 via TOPICAL

## 2013-10-21 MED ORDER — THROMBIN 5000 UNITS EX SOLR
OROMUCOSAL | Status: DC | PRN
  Administered 2013-10-21: 22:00:00 via TOPICAL

## 2013-10-21 MED ORDER — BUPIVACAINE HCL (PF) 0.5 % IJ SOLN
INTRAMUSCULAR | Status: DC | PRN
  Administered 2013-10-21: 3.5 mL

## 2013-10-21 MED ORDER — LACTATED RINGERS IV SOLN: INTRAVENOUS | Status: DC | PRN

## 2013-10-21 SURGICAL SUPPLY — 85 items
APL SKNCLS STERI-STRIP NONHPOA (GAUZE/BANDAGES/DRESSINGS)
BANDAGE GAUZE 4  KLING STR (GAUZE/BANDAGES/DRESSINGS) IMPLANT
BENZOIN TINCTURE PRP APPL 2/3 (GAUZE/BANDAGES/DRESSINGS) IMPLANT
BIT DRILL WIRE PASS 1.3MM (BIT) IMPLANT
BNDG GAUZE ELAST 4 BULKY (GAUZE/BANDAGES/DRESSINGS) IMPLANT
BRUSH SCRUB EZ 1% IODOPHOR (MISCELLANEOUS) ×3 IMPLANT
BRUSH SCRUB EZ PLAIN DRY (MISCELLANEOUS) ×3 IMPLANT
BUR ACORN 6.0 PRECISION (BURR) ×2 IMPLANT
BUR ACORN 6.0MM PRECISION (BURR) ×1
BUR ROUTER D-58 CRANI (BURR) IMPLANT
CANISTER SUCT 3000ML (MISCELLANEOUS) ×3 IMPLANT
CATH FOLEY 2WAY SLVR  5CC 16FR (CATHETERS) ×2
CATH FOLEY 2WAY SLVR 5CC 16FR (CATHETERS) IMPLANT
CATH ROBINSON RED A/P 12FR (CATHETERS) IMPLANT
CLIP TI MEDIUM 6 (CLIP) IMPLANT
CONT SPEC 4OZ CLIKSEAL STRL BL (MISCELLANEOUS) ×3 IMPLANT
CORDS BIPOLAR (ELECTRODE) ×3 IMPLANT
DECANTER SPIKE VIAL GLASS SM (MISCELLANEOUS) ×3 IMPLANT
DRAIN PENROSE 1/2X12 LTX STRL (WOUND CARE) IMPLANT
DRAIN SNY WOU 7FLT (WOUND CARE) IMPLANT
DRAPE NEUROLOGICAL W/INCISE (DRAPES) ×3 IMPLANT
DRAPE SURG IRRIG POUCH 19X23 (DRAPES) IMPLANT
DRAPE WARM FLUID 44X44 (DRAPE) ×3 IMPLANT
DRILL WIRE PASS 1.3MM (BIT)
DRSG OPSITE 4X5.5 SM (GAUZE/BANDAGES/DRESSINGS) ×4 IMPLANT
DRSG PAD ABDOMINAL 8X10 ST (GAUZE/BANDAGES/DRESSINGS) IMPLANT
DRSG TELFA 3X8 NADH (GAUZE/BANDAGES/DRESSINGS) ×3 IMPLANT
DURAPREP 6ML APPLICATOR 50/CS (WOUND CARE) ×3 IMPLANT
ELECT CAUTERY BLADE 6.4 (BLADE) ×3 IMPLANT
ELECT REM PT RETURN 9FT ADLT (ELECTROSURGICAL) ×3
ELECTRODE REM PT RTRN 9FT ADLT (ELECTROSURGICAL) ×1 IMPLANT
EVACUATOR SILICONE 100CC (DRAIN) ×2 IMPLANT
GAUZE SPONGE 4X4 12PLY STRL (GAUZE/BANDAGES/DRESSINGS) IMPLANT
GAUZE SPONGE 4X4 16PLY XRAY LF (GAUZE/BANDAGES/DRESSINGS) IMPLANT
GLOVE BIO SURGEON STRL SZ 6.5 (GLOVE) ×4 IMPLANT
GLOVE BIO SURGEON STRL SZ8 (GLOVE) ×3 IMPLANT
GLOVE BIO SURGEONS STRL SZ 6.5 (GLOVE) ×4
GLOVE BIOGEL PI IND STRL 7.0 (GLOVE) IMPLANT
GLOVE BIOGEL PI IND STRL 8 (GLOVE) ×1 IMPLANT
GLOVE BIOGEL PI IND STRL 8.5 (GLOVE) ×1 IMPLANT
GLOVE BIOGEL PI INDICATOR 7.0 (GLOVE) ×2
GLOVE BIOGEL PI INDICATOR 8 (GLOVE) ×2
GLOVE BIOGEL PI INDICATOR 8.5 (GLOVE) ×2
GLOVE ECLIPSE 7.5 STRL STRAW (GLOVE) ×3 IMPLANT
GLOVE EXAM NITRILE LRG STRL (GLOVE) IMPLANT
GLOVE EXAM NITRILE MD LF STRL (GLOVE) IMPLANT
GLOVE EXAM NITRILE XL STR (GLOVE) IMPLANT
GLOVE EXAM NITRILE XS STR PU (GLOVE) IMPLANT
GOWN STRL REUS W/ TWL LRG LVL3 (GOWN DISPOSABLE) IMPLANT
GOWN STRL REUS W/ TWL XL LVL3 (GOWN DISPOSABLE) IMPLANT
GOWN STRL REUS W/TWL 2XL LVL3 (GOWN DISPOSABLE) IMPLANT
GOWN STRL REUS W/TWL LRG LVL3 (GOWN DISPOSABLE) ×9
GOWN STRL REUS W/TWL XL LVL3 (GOWN DISPOSABLE)
HEMOSTAT SURGICEL 2X14 (HEMOSTASIS) ×3 IMPLANT
KIT BASIN OR (CUSTOM PROCEDURE TRAY) ×3 IMPLANT
KIT ROOM TURNOVER OR (KITS) ×3 IMPLANT
NDL HYPO 25X1 1.5 SAFETY (NEEDLE) ×1 IMPLANT
NEEDLE HYPO 25X1 1.5 SAFETY (NEEDLE) ×3 IMPLANT
NS IRRIG 1000ML POUR BTL (IV SOLUTION) ×3 IMPLANT
PACK CRANIOTOMY (CUSTOM PROCEDURE TRAY) ×3 IMPLANT
PAD ARMBOARD 7.5X6 YLW CONV (MISCELLANEOUS) ×5 IMPLANT
PAD DRESSING TELFA 3X8 NADH (GAUZE/BANDAGES/DRESSINGS) ×1 IMPLANT
PATTIES SURGICAL .5 X.5 (GAUZE/BANDAGES/DRESSINGS) IMPLANT
PATTIES SURGICAL .5 X3 (DISPOSABLE) IMPLANT
PATTIES SURGICAL 1X1 (DISPOSABLE) IMPLANT
PIN MAYFIELD SKULL DISP (PIN) IMPLANT
PLATE 1.5  2HOLE LNG NEURO (Plate) ×6 IMPLANT
PLATE 1.5 2HOLE LNG NEURO (Plate) IMPLANT
SCREW SELF DRILL HT 1.5/4MM (Screw) ×12 IMPLANT
SPECIMEN JAR SMALL (MISCELLANEOUS) IMPLANT
SPONGE NEURO XRAY DETECT 1X3 (DISPOSABLE) IMPLANT
SPONGE SURGIFOAM ABS GEL 12-7 (HEMOSTASIS) IMPLANT
STAPLER SKIN PROX WIDE 3.9 (STAPLE) ×3 IMPLANT
SUT ETHILON 3 0 PS 1 (SUTURE) IMPLANT
SUT NURALON 4 0 TR CR/8 (SUTURE) ×4 IMPLANT
SUT VIC AB 2-0 CP2 18 (SUTURE) ×6 IMPLANT
SUT VIC AB 3-0 SH 8-18 (SUTURE) ×2 IMPLANT
SYR 20ML ECCENTRIC (SYRINGE) ×3 IMPLANT
SYR CONTROL 10ML LL (SYRINGE) ×3 IMPLANT
TOWEL OR 17X24 6PK STRL BLUE (TOWEL DISPOSABLE) ×3 IMPLANT
TOWEL OR 17X26 10 PK STRL BLUE (TOWEL DISPOSABLE) ×3 IMPLANT
TRAP SPECIMEN MUCOUS 40CC (MISCELLANEOUS) IMPLANT
TRAY FOLEY CATH 14FRSI W/METER (CATHETERS) IMPLANT
UNDERPAD 30X30 INCONTINENT (UNDERPADS AND DIAPERS) IMPLANT
WATER STERILE IRR 1000ML POUR (IV SOLUTION) ×3 IMPLANT
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
## 2013-10-21 NOTE — Plan of Care (Signed)
Problem: Consults Goal: Diagnosis - Craniotomy Subdural hematoma on the right side

## 2013-10-21 NOTE — Transfer of Care (Signed)
Immediate Anesthesia Transfer of Care Note  Patient: Gary CrossRandall S Sheehan  Procedure(s) Performed: Procedure(s): CRANIOTOMY HEMATOMA EVACUATION SUBDURAL (Right)  Patient Location: ICU  Anesthesia Type:General  Level of Consciousness: awake, alert  and patient cooperative  Airway & Oxygen Therapy: Patient Spontanous Breathing and Patient connected to nasal cannula oxygen  Post-op Assessment: Report given to PACU RN and Post -op Vital signs reviewed and stable  Post vital signs: Reviewed and stable  Complications: No apparent anesthesia complications
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
## 2013-10-21 NOTE — Progress Notes (Signed)
eLink Physician-Brief Progress Note Patient Name: Gary [name] DOB: 09/27/1960 MRN: [id number]   Date of Service  10/21/2013  HPI/Events of Note  PCCM rounding note, SDH, liver failure, actively drinking.  eICU Interventions  Spoke with Dr. Venetia MaxonStern, would like to take to the OR, will transfuse 2 units FFP, 1 unit platelet, vitamin K given and communicated with Dr. Marchelle Gearingamaswamy to see.        YACOUB,WESAM 10/21/2013, 5:53 PM

## 2013-10-21 NOTE — Anesthesia Preprocedure Evaluation (Addendum)
Anesthesia Evaluation  Patient identified by MRN, date of birth, ID band Patient awake    Reviewed: Allergy & Precautions, H&P , NPO status , Patient's Chart, lab work & pertinent test results  History of Anesthesia Complications Negative for: history of anesthetic complications  Airway Mallampati: I TM Distance: >3 FB Neck ROM: Full    Dental  (+) Edentulous Upper, Edentulous Lower   Pulmonary former smoker,  breath sounds clear to auscultation        Cardiovascular negative cardio ROS  Rhythm:Regular Rate:Normal     Neuro/Psych SDH: for evacuation    GI/Hepatic GERD-  Controlled,(+) Cirrhosis -    substance abuse (reported >200oz/day)  alcohol use and cocaine use, Hepatitis -H/o GI bleed   Endo/Other  negative endocrine ROS  Renal/GU negative Renal ROS     Musculoskeletal   Abdominal   Peds  Hematology  (+) Blood dyscrasia (plt 57K, INR 1.63: pt receiving plts and 2u FFP), ,   Anesthesia Other Findings   Reproductive/Obstetrics                        Anesthesia Physical Anesthesia Plan  ASA: IV  Anesthesia Plan: General   Post-op Pain Management:    Induction: Intravenous  Airway Management Planned: Oral ETT  Additional Equipment: Arterial line  Intra-op Plan:   Post-operative Plan: Possible Post-op intubation/ventilation  Informed Consent: I have reviewed the patients History and Physical, chart, labs and discussed the procedure including the risks, benefits and alternatives for the proposed anesthesia with the patient or authorized representative who has indicated his/her understanding and acceptance.     Plan Discussed with: CRNA and Surgeon  Anesthesia Plan Comments: (Plan routine monitors, A line, GETA with transfusion and possible post op ventilation)        Anesthesia Quick Evaluation

## 2013-10-21 NOTE — Progress Notes (Signed)
OR team arrived to room to get pt. Vitals up to date on blood product transfusion. No reactions noted. No change in pt's condition. CRNA will continue blood transfusion.
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
## 2013-10-22 ENCOUNTER — Inpatient Hospital Stay (HOSPITAL_COMMUNITY): Payer: Medicaid Other

## 2013-10-22 DIAGNOSIS — K7460 Unspecified cirrhosis of liver: Secondary | ICD-10-CM

## 2013-10-22 LAB — PROTIME-INR
INR: 1.59 — AB (ref 0.00–1.49)
Prothrombin Time: 19 seconds — ABNORMAL HIGH (ref 11.6–15.2)

## 2013-10-22 LAB — PREPARE PLATELET PHERESIS: Unit division: 0

## 2013-10-22 LAB — BLOOD GAS, ARTERIAL
Acid-base deficit: 0.7 mmol/L (ref 0.0–2.0)
Bicarbonate: 23.9 mEq/L (ref 20.0–24.0)
DRAWN BY: 331761
FIO2: 0.21 %
O2 Saturation: 92.5 %
Patient temperature: 98.6
TCO2: 25.2 mmol/L (ref 0–100)
pCO2 arterial: 42.2 mmHg (ref 35.0–45.0)
pH, Arterial: 7.371 (ref 7.350–7.450)
pO2, Arterial: 67.3 mmHg — ABNORMAL LOW (ref 80.0–100.0)

## 2013-10-22 LAB — PREPARE FRESH FROZEN PLASMA
Unit division: 0
Unit division: 0
Unit division: 0

## 2013-10-22 LAB — CBC
HEMATOCRIT: 29.7 % — AB (ref 39.0–52.0)
HEMATOCRIT: 31.2 % — AB (ref 39.0–52.0)
Hemoglobin: 10.4 g/dL — ABNORMAL LOW (ref 13.0–17.0)
Hemoglobin: 10.6 g/dL — ABNORMAL LOW (ref 13.0–17.0)
MCH: 34.2 pg — ABNORMAL HIGH (ref 26.0–34.0)
MCH: 33.5 pg (ref 26.0–34.0)
MCHC: 35 g/dL (ref 30.0–36.0)
MCHC: 34 g/dL (ref 30.0–36.0)
MCV: 97.7 fL (ref 78.0–100.0)
MCV: 98.7 fL (ref 78.0–100.0)
Platelets: 61 10*3/uL — ABNORMAL LOW (ref 150–400)
Platelets: 70 10*3/uL — ABNORMAL LOW (ref 150–400)
RBC: 3.04 MIL/uL — ABNORMAL LOW (ref 4.22–5.81)
RBC: 3.16 MIL/uL — ABNORMAL LOW (ref 4.22–5.81)
RDW: 14.7 % (ref 11.5–15.5)
RDW: 14.7 % (ref 11.5–15.5)
WBC: 3.3 10*3/uL — AB (ref 4.0–10.5)
WBC: 2.7 10*3/uL — ABNORMAL LOW (ref 4.0–10.5)

## 2013-10-22 LAB — BASIC METABOLIC PANEL
Anion gap: 11 (ref 5–15)
BUN: 12 mg/dL (ref 6–23)
CO2: 23 meq/L (ref 19–32)
Calcium: 7.7 mg/dL — ABNORMAL LOW (ref 8.4–10.5)
Chloride: 106 mEq/L (ref 96–112)
Creatinine, Ser: 0.64 mg/dL (ref 0.50–1.35)
GFR calc non Af Amer: >90 mL/min (ref >90)
Glucose, Bld: 82 mg/dL (ref 70–99)
POTASSIUM: 3.6 meq/L — AB (ref 3.7–5.3)
Sodium: 140 mEq/L (ref 137–147)

## 2013-10-22 LAB — PHOSPHORUS: PHOSPHORUS: 2.7 mg/dL (ref 2.3–4.6)

## 2013-10-22 LAB — BILIRUBIN, TOTAL: Total Bilirubin: 2.7 mg/dL — ABNORMAL HIGH (ref 0.3–1.2)

## 2013-10-22 LAB — APTT: aPTT: 38 seconds — ABNORMAL HIGH (ref 24–37)

## 2013-10-22 LAB — MAGNESIUM: Magnesium: 1.9 mg/dL (ref 1.5–2.5)

## 2013-10-22 LAB — TROPONIN I
Troponin I: <0.3 ng/mL (ref <0.30)
Troponin I: <0.3 ng/mL (ref <0.30)

## 2013-10-22 MED ORDER — SODIUM CHLORIDE 0.9 % IV SOLN
Freq: Once | INTRAVENOUS | Status: AC
  Administered 2013-10-22: 10:00:00 via INTRAVENOUS

## 2013-10-22 NOTE — Progress Notes (Signed)
PULMONARY / CRITICAL CARE MEDICINE   Name: Gary CrossRandall S Bayle MRN: [id number] DOB: 08/18/1960    ADMISSION DATE:  10/21/2013 CONSULTATION DATE:  10/21/2013  REFERRING MD :  OSH  CHIEF COMPLAINT:  SDH  INITIAL PRESENTATION: 53 yo with cirrhosis presented to [place] 10/6 after being found down at home. He was found to have large SDH and [facility]. PCCM to admit for ICU/ vent management.   STUDIES:  10/7 CT (post op) > considerable SDH remains, drain in good position.   SIGNIFICANT EVENTS: 10/6 > found down > large SDH > [facility] > out to ICU on vent.    SUBJECTIVE:   VITAL SIGNS: Temp:  [97.6 F (36.4 C)-99 F (37.2 C)] 97.9 F (36.6 C) (10/07 0930) Pulse Rate:  [47-86] 54 (10/07 1000) Resp:  [9-21] 13 (10/07 1000) BP: (70-128)/(37-81) 95/55 mmHg (10/07 1000) SpO2:  [89 %-100 %] 100 % (10/07 1000) Arterial Line BP: (93-138)/(46-82) 95/63 mmHg (10/07 1000) Weight:  [87.1 kg (192 lb 0.3 oz)-92.9 kg (204 lb 12.9 oz)] 92.9 kg (204 lb 12.9 oz) (10/07 0559) HEMODYNAMICS:   VENTILATOR SETTINGS:   INTAKE / OUTPUT:  Intake/Output Summary (Last 24 hours) at 10/22/13 1009 Last data filed at 10/22/13 1000  Gross per 24 hour  Intake 3689.75 ml  Output    415 ml  Net 3274.75 ml    Physical Examination:  General: awake alert cooperative  Neuro: L Hemiparesis much improved. Alert, oriented, no focal deficit HEENT: Half head shaved, JP in place R cranium. Jaundiced sclera.  Neck: no bruits, no JVD Cardiovascular: RRR, no m/r/g/ Lungs: CTAB, normal WOB  Abdomen: Non-tender non-distended, BS normoactive.   Musculoskeletal: No acute deformity or ROM limitation Skin: mult tattoos on forearms noted, mildly jaundiced  LABS:  CBC  Recent Labs Lab 10/21/13 1818 10/21/13 2136 10/22/13 0510  WBC 5.9 5.3 3.3*  HGB 12.8* 10.6* 10.4*  HCT 37.2* 30.7* 29.7*  PLT 57* 75* 61*   Coag's  Recent Labs Lab  10/21/13 1818 10/22/13 0510  APTT 36 38*  INR 1.63* 1.59*   BMET  Recent Labs Lab 10/21/13 1818 10/22/13 0510  NA 141 140  K 4.3 3.6*  CL 104 106  CO2 26 23  BUN 14 12  CREATININE 0.73 0.64  GLUCOSE 75 82   Electrolytes  Recent Labs Lab 10/21/13 1818 10/22/13 0510  CALCIUM 8.4 7.7*  MG  --  1.9  PHOS  --  2.7   Sepsis Markers  Recent Labs Lab 10/21/13 1818  LATICACIDVEN 2.0   ABG  Recent Labs Lab 10/22/13 0500  PHART 7.371  PCO2ART 42.2  PO2ART 67.3*   Liver Enzymes  Recent Labs Lab 10/21/13 1818  AST 62*  ALT 28  ALKPHOS 110  BILITOT 4.5*  ALBUMIN 2.6*   Cardiac Enzymes  Recent Labs Lab 10/21/13 1818 10/22/13 0010 10/22/13 0510  TROPONINI <0.30 <0.30 <0.30   Glucose No results found for this basename: GLUCAP,  in the last 168 hours  Imaging Dg Chest Port 1 View  10/21/2013   CLINICAL DATA:  Short of breath.  Congestion.  EXAM: PORTABLE CHEST - 1 VIEW  COMPARISON:  None.  FINDINGS: Normal heart size. Clear lungs. No pneumothorax. Healing left lateral rib fracture.  IMPRESSION: No active disease.   Electronically Signed   By: Maryclare BeanArt  Hoss M.D.   On: 10/21/2013 16:57     ASSESSMENT / PLAN:  PULMONARY  OETT 10/6 > 10/6  A:  No acute issues (extubated in PACU)  P:  Monitor SpO2 goal >92%  CARDIOVASCULAR  A:  Bradycardia   P:  Monitor for arrhythmia on tele  trops  He denies use coumadin, it is unclear if was taking  MAP goal 70-75   RENAL  A:  Hypokalemia Hypernatremia (resolved)  P:  Trend BMETs  Add K to IVF  GASTROINTESTINAL  A: Hepatitis  Cirrhosis  Nutrition  Hx of GI bleeding (possible varices?)   P:  Check bilirubin Will calculate MELD  Continue Rifaximin May need lactulose HH diet.  HEMATOLOGIC  A:  Leukopenia Thrombocytopenia   P:  Platelets, FFP transfusing per NSG Recheck CBC SCDs for VTE ppx Follow coags  INFECTIOUS  A:  No acute infectious process   P:  Trend WBC and fever  curve  ENDOCRINE  A:  Hyperglycemia  P:  Monitor glucose on chems SSI if consistently greater than 180  NEUROLOGIC  A:  Subdural hematoma  Midline shift  Toxic encephalopathy  ETOH use, high risk WD   P:  Management per neurosurgery Serial Neuro exams  Keppra Thimaine, folic  Family updated: 10/7 long discussion with Uncle and patient (who found pt yesterday) about current situation and plan moving forward.    Interdisciplinary Family Meeting v Palliative Care Meeting:   Joneen Roach, ACNP [facility] Pulmonology/Critical Care Pager [phone] or [phone]    PCCM ATTENDING: I have interviewed and examined the patient and reviewed the database. I have formulated the assessment and plan as reflected in the note above with amendments made by me.   Billy Fischer, MD;  PCCM service; Mobile [phone]

## 2013-10-22 NOTE — Progress Notes (Signed)
UR completed.  Tylene Quashie, RN BSN [facility] CCM Trauma/Neuro ICU Case Manager [phone]  

## 2013-10-22 NOTE — Progress Notes (Signed)
Subjective: Patient reports "I feel great."  Objective: Vital signs in last 24 hours: Temp:  [97.6 F (36.4 C)-99 F (37.2 C)] 97.6 F (36.4 C) (10/07 0344) Pulse Rate:  [47-86] 50 (10/07 0730) Resp:  [9-21] 12 (10/07 0730) BP: (70-128)/(37-81) 91/54 mmHg (10/07 0730) SpO2:  [89 %-100 %] 99 % (10/07 0730) Arterial Line BP: (93-138)/(46-82) 93/82 mmHg (10/07 0730) Weight:  [87.1 kg (192 lb 0.3 oz)-92.9 kg (204 lb 12.9 oz)] 92.9 kg (204 lb 12.9 oz) (10/07 0559)  Intake/Output from previous day: 10/06 0701 - 10/07 0700 In: 3314.8 [I.V.:2233.8; Blood:531; IV Piggyback:550] Out: 415 [Urine:350; Drains:65] Intake/Output this shift:    Physical Exam: Marked improvement in left hemiparesis and patient also states head feels better, as does vision.  Dressing CDI.  Drain bloody drainage.  Lab Results:  Recent Labs  10/21/13 2136 10/22/13 0510  WBC 5.3 3.3*  HGB 10.6* 10.4*  HCT 30.7* 29.7*  PLT 75* 61*   BMET  Recent Labs  10/21/13 1818 10/22/13 0510  NA 141 140  K 4.3 3.6*  CL 104 106  CO2 26 23  GLUCOSE 75 82  BUN 14 12  CREATININE 0.73 0.64  CALCIUM 8.4 7.7*    Studies/Results: Ct Head Wo Contrast  10/22/2013   CLINICAL DATA:  Postop subdural evacuation.  EXAM: CT HEAD WITHOUT CONTRAST  TECHNIQUE: Contiguous axial images were obtained from the base of the skull through the vertex without contrast.  COMPARISON:  [facility]  FINDINGS: The patient has undergone a small RIGHT frontal craniotomy for subdural evacuation. Subdural drain good position in the frontal compartment. There is considerable residual hyperdense and low attenuation fluid in the subdural space over the frontal and parietal lobes. Midline shift RIGHT to LEFT is improved, currently measuring 5 mm. Moderate pneumocephalus. No parenchymal hematoma or hydrocephalus.  IMPRESSION: Considerable residual subdural hematoma remains. Subdural drain good position.   Electronically Signed    By: Davonna BellingJohn  Curnes M.D.   On: 10/22/2013 07:40   Dg Chest Port 1 View  10/21/2013   CLINICAL DATA:  Short of breath.  Congestion.  EXAM: PORTABLE CHEST - 1 VIEW  COMPARISON:  None.  FINDINGS: Normal heart size. Clear lungs. No pneumothorax. Healing left lateral rib fracture.  IMPRESSION: No active disease.   Electronically Signed   By: Maryclare BeanArt  Hoss M.D.   On: 10/21/2013 16:57    Assessment/Plan: Head CT improved.  Plts still low and INR still high.  Transfuse platelets and FFP today.  Continue drain today and observe in ICU.  Per family and patient wishes, he is Full Code.    LOS: 1 day    Gary HeckleSTERN,Jaquilla Woodroof D, MD 10/22/2013, 7:59 AM

## 2013-10-23 ENCOUNTER — Encounter (HOSPITAL_COMMUNITY): Payer: Self-pay | Admitting: Neurosurgery

## 2013-10-23 LAB — PREPARE FRESH FROZEN PLASMA
UNIT DIVISION: 0
Unit division: 0

## 2013-10-23 LAB — BASIC METABOLIC PANEL
Anion gap: 6 (ref 5–15)
BUN: 9 mg/dL (ref 6–23)
CALCIUM: 8 mg/dL — AB (ref 8.4–10.5)
CO2: 25 mEq/L (ref 19–32)
Chloride: 107 mEq/L (ref 96–112)
Creatinine, Ser: 0.65 mg/dL (ref 0.50–1.35)
GFR calc Af Amer: >90 mL/min (ref >90)
Glucose, Bld: 99 mg/dL (ref 70–99)
Potassium: 4.1 mEq/L (ref 3.7–5.3)
Sodium: 138 mEq/L (ref 137–147)

## 2013-10-23 LAB — CBC
HCT: 31.1 % — ABNORMAL LOW (ref 39.0–52.0)
Hemoglobin: 10.6 g/dL — ABNORMAL LOW (ref 13.0–17.0)
MCH: 33.5 pg (ref 26.0–34.0)
MCHC: 34.1 g/dL (ref 30.0–36.0)
MCV: 98.4 fL (ref 78.0–100.0)
PLATELETS: 64 10*3/uL — AB (ref 150–400)
RBC: 3.16 MIL/uL — ABNORMAL LOW (ref 4.22–5.81)
RDW: 14.5 % (ref 11.5–15.5)
WBC: 3 10*3/uL — ABNORMAL LOW (ref 4.0–10.5)

## 2013-10-23 LAB — PREPARE PLATELET PHERESIS: UNIT DIVISION: 0

## 2013-10-23 LAB — BLOOD PRODUCT ORDER (VERBAL) VERIFICATION

## 2013-10-23 MED ORDER — SODIUM CHLORIDE 0.9 % IV SOLN
Freq: Once | INTRAVENOUS | Status: AC
  Administered 2013-10-23: 10:00:00 via INTRAVENOUS

## 2013-10-23 MED ORDER — SERTRALINE HCL 25 MG PO TABS
25.0000 mg | ORAL_TABLET | Freq: Every day | ORAL | Status: DC
  Filled 2013-10-23 (×2): qty 1

## 2013-10-23 NOTE — Progress Notes (Signed)
Patient progressing nicely.  Will transfuse platelets and remove drain today.

## 2013-10-23 NOTE — Anesthesia Postprocedure Evaluation (Signed)
  Anesthesia Post-op Note  Patient: Gary CrossRandall S Caudle  Procedure(s) Performed: Procedure(s): CRANIOTOMY HEMATOMA EVACUATION SUBDURAL (Right)  Patient Location: ICU  Anesthesia Type:General  Level of Consciousness: awake, alert , oriented, patient cooperative and responds to stimulation  Airway and Oxygen Therapy: Patient Spontanous Breathing  Post-op Pain: none  Post-op Assessment: Post-op Vital signs reviewed, Patient's Cardiovascular Status Stable, Respiratory Function Stable, Patent Airway, No signs of Nausea or vomiting and Pain level controlled  Post-op Vital Signs: Reviewed and stable  Last Vitals:  Filed Vitals:   10/23/13 1609  BP:   Pulse:   Temp: 37.2 C  Resp:     Complications: No apparent anesthesia complications

## 2013-10-23 NOTE — Progress Notes (Signed)
Subjective: Patient reports "I feel great! I think I might be able to write again"  Objective: Vital signs in last 24 hours: Temp:  [97.7 F (36.5 C)-99.2 F (37.3 C)] 98.3 F (36.8 C) (10/08 0754) Pulse Rate:  [51-75] 63 (10/08 0600) Resp:  [9-24] 15 (10/08 0600) BP: (86-116)/(51-74) 113/74 mmHg (10/08 0600) SpO2:  [93 %-100 %] 96 % (10/08 0600) Arterial Line BP: (71-145)/(56-81) 145/81 mmHg (10/07 1500) Weight:  [97.9 kg (215 lb 13.3 oz)] 97.9 kg (215 lb 13.3 oz) (10/08 0500)  Intake/Output from previous day: 10/07 0701 - 10/08 0700 In: 4006 [P.O.:1680; I.V.:1500; Blood:576; IV Piggyback:250] Out: 585 [Urine:550; Drains:35] Intake/Output this shift: Total I/O In: -  Out: 100 [Urine:100]  Alert, conversant, without c/o pain. Reports improved vision & improved strength left side, requesting pencil to practice his signature (left handed). JP without drainage overnight. 35ml yesterday. Hgb & Hct without change, platelets down to 64.  Lab Results:  Recent Labs  10/22/13 1400 10/23/13 0245  WBC 2.7* 3.0*  HGB 10.6* 10.6*  HCT 31.2* 31.1*  PLT 70* 64*   BMET  Recent Labs  10/22/13 0510 10/23/13 0245  NA 140 138  K 3.6* 4.1  CL 106 107  CO2 23 25  GLUCOSE 82 99  BUN 12 9  CREATININE 0.64 0.65  CALCIUM 7.7* 8.0*    Studies/Results: Ct Head Wo Contrast  10/22/2013   CLINICAL DATA:  Postop subdural evacuation.  EXAM: CT HEAD WITHOUT CONTRAST  TECHNIQUE: Contiguous axial images were obtained from the base of the skull through the vertex without contrast.  COMPARISON:  [facility]  FINDINGS: The patient has undergone a small RIGHT frontal craniotomy for subdural evacuation. Subdural drain good position in the frontal compartment. There is considerable residual hyperdense and low attenuation fluid in the subdural space over the frontal and parietal lobes. Midline shift RIGHT to LEFT is improved, currently measuring 5 mm. Moderate pneumocephalus.  No parenchymal hematoma or hydrocephalus.  IMPRESSION: Considerable residual subdural hematoma remains. Subdural drain good position.   Electronically Signed   By: Davonna BellingJohn  Curnes M.D.   On: 10/22/2013 07:40   Dg Chest Port 1 View  10/22/2013   CLINICAL DATA:  Extubation.  EXAM: PORTABLE CHEST - 1 VIEW  COMPARISON:  10/21/2013.  FINDINGS: Mediastinum and hilar structures normal. Left lower lobe atelectasis and/or infiltrate noted. No pleural effusion or pneumothorax. Cardiomegaly with normal pulmonary vascularity. No acute bony abnormality.  IMPRESSION: 1. Left lower lobe subsegmental atelectasis and or infiltrate. 2. Cardiomegaly.  Normal pulmonary vascularity.   Electronically Signed   By: Maisie Fushomas  Register   On: 10/22/2013 08:06   Dg Chest Port 1 View  10/21/2013   CLINICAL DATA:  Short of breath.  Congestion.  EXAM: PORTABLE CHEST - 1 VIEW  COMPARISON:  None.  FINDINGS: Normal heart size. Clear lungs. No pneumothorax. Healing left lateral rib fracture.  IMPRESSION: No active disease.   Electronically Signed   By: Maryclare BeanArt  Hoss M.D.   On: 10/21/2013 16:57    Assessment/Plan: Improving   LOS: 2 days  Per DrStern, transfuse 1 unit platelets. Order entered. DrStern will visit between surgeries this am, to possibly pull drain.   Georgiann Cockeroteat, Zayden Hahne 10/23/2013, 8:31 AM

## 2013-10-23 NOTE — Progress Notes (Signed)
PULMONARY / CRITICAL CARE MEDICINE   Name: Gary [name] MRN: [id number] DOB: 06/21/1960    ADMISSION DATE:  10/21/2013 CONSULTATION DATE:  10/21/2013  REFERRING MD :  OSH  CHIEF COMPLAINT:  SDH  INITIAL PRESENTATION: 53 yo with cirrhosis presented to [place] 10/6 after being found down at home. He was found to have large SDH and [facility] PCCM to admit for ICU/ vent management.   STUDIES:  10/7 CT (post op) > considerable SDH remains, drain in good position. 10/08 Plts transfused for count of  64k. Drain removed  SIGNIFICANT EVENTS: 10/6 > found down > large SDH [facility] > out to ICU on vent.    SUBJECTIVE:  No new complaints. No distress  VITAL SIGNS: Temp:  [97.9 F (36.6 C)-100.1 F (37.8 C)] 100.1 F (37.8 C) (10/08 2000) Pulse Rate:  [56-77] 74 (10/08 2000) Resp:  [9-22] 21 (10/08 2000) BP: (94-141)/(56-89) 132/74 mmHg (10/08 2000) SpO2:  [94 %-100 %] 97 % (10/08 2000) Weight:  [97.9 kg (215 lb 13.3 oz)] 97.9 kg (215 lb 13.3 oz) (10/08 0500) HEMODYNAMICS:   VENTILATOR SETTINGS:   INTAKE / OUTPUT:  Intake/Output Summary (Last 24 hours) at 10/23/13 2131 Last data filed at 10/23/13 2000  Gross per 24 hour  Intake   2080 ml  Output   1675 ml  Net    405 ml    Physical Examination:  General: awake alert cooperative  Neuro: Alert, oriented, no focal deficit HEENT: post op changes and drain Cardiovascular: RRR, no M Lungs: CTAB, normal WOB  Abdomen: Non-tender non-distended, BS normoactive.   Musculoskeletal: No acute deformity or ROM limitation Skin: mult tattoos on forearms noted, mildly jaundiced  LABS: I have reviewed all of today's lab results. Relevant abnormalities are discussed in the A/P section  ASSESSMENT / PLAN: SDH, s/p evacuation Alcoholic Cirrhosis  Thrombocytopenia  Alcoholic coagulopathy ETOH use, no evidence of WD   Cont current Rx. Watch in ICU until  drain removed by NS Check LFTs and coags in AM 10/09   Billy Fischeravid Zanyia Silbaugh, MD;  PCCM service; Mobile [phone]

## 2013-10-24 LAB — HEPATIC FUNCTION PANEL
ALBUMIN: 2.3 g/dL — AB (ref 3.5–5.2)
ALT: 20 U/L (ref 0–53)
AST: 44 U/L — AB (ref 0–37)
Alkaline Phosphatase: 119 U/L — ABNORMAL HIGH (ref 39–117)
Bilirubin, Direct: 0.9 mg/dL — ABNORMAL HIGH (ref 0.0–0.3)
Indirect Bilirubin: 1 mg/dL — ABNORMAL HIGH (ref 0.3–0.9)
Total Bilirubin: 1.9 mg/dL — ABNORMAL HIGH (ref 0.3–1.2)
Total Protein: 5.2 g/dL — ABNORMAL LOW (ref 6.0–8.3)

## 2013-10-24 LAB — TYPE AND SCREEN
ABO/RH(D): A NEG
ANTIBODY SCREEN: NEGATIVE
UNIT DIVISION: 0
Unit division: 0

## 2013-10-24 LAB — BASIC METABOLIC PANEL
ANION GAP: 7 (ref 5–15)
BUN: 6 mg/dL (ref 6–23)
CALCIUM: 8 mg/dL — AB (ref 8.4–10.5)
CO2: 25 meq/L (ref 19–32)
Chloride: 101 mEq/L (ref 96–112)
Creatinine, Ser: 0.6 mg/dL (ref 0.50–1.35)
GFR calc Af Amer: >90 mL/min (ref >90)
Glucose, Bld: 121 mg/dL — ABNORMAL HIGH (ref 70–99)
Potassium: 4.3 mEq/L (ref 3.7–5.3)
SODIUM: 133 meq/L — AB (ref 137–147)

## 2013-10-24 LAB — CBC
HCT: 30.7 % — ABNORMAL LOW (ref 39.0–52.0)
Hemoglobin: 10.6 g/dL — ABNORMAL LOW (ref 13.0–17.0)
MCH: 33.5 pg (ref 26.0–34.0)
MCHC: 34.5 g/dL (ref 30.0–36.0)
MCV: 97.2 fL (ref 78.0–100.0)
RBC: 3.16 MIL/uL — AB (ref 4.22–5.81)
RDW: 14.1 % (ref 11.5–15.5)
WBC: 4.4 10*3/uL (ref 4.0–10.5)

## 2013-10-24 LAB — PREPARE PLATELET PHERESIS: Unit division: 0

## 2013-10-24 LAB — APTT: aPTT: 36 seconds (ref 24–37)

## 2013-10-24 LAB — PROTIME-INR
INR: 1.6 — AB (ref 0.00–1.49)
PROTHROMBIN TIME: 19.1 s — AB (ref 11.6–15.2)

## 2013-10-24 MED ORDER — SERTRALINE HCL 100 MG PO TABS
100.0000 mg | ORAL_TABLET | Freq: Every day | ORAL | Status: DC
  Administered 2013-10-24: 100 mg via ORAL
  Filled 2013-10-24 (×3): qty 1

## 2013-10-24 NOTE — Progress Notes (Signed)
Subjective: Patient reports "I feel good...not having any problems"  Objective: Vital signs in last 24 hours: Temp:  [97.8 F (36.6 C)-100.1 F (37.8 C)] 97.8 F (36.6 C) (10/09 0800) Pulse Rate:  [57-89] 73 (10/09 0600) Resp:  [14-22] 20 (10/09 0500) BP: (109-141)/(64-89) 113/70 mmHg (10/09 0600) SpO2:  [94 %-100 %] 96 % (10/09 0600) Weight:  [98.4 kg (216 lb 14.9 oz)] 98.4 kg (216 lb 14.9 oz) (10/09 0500)  Intake/Output from previous day: 10/08 0701 - 10/09 0700 In: 1960 [P.O.:1460; Blood:300; IV Piggyback:200] Out: 3825 [Urine:3825] Intake/Output this shift: Total I/O In: -  Out: 575 [Urine:575]  Awakens to  voice, reporting no headache, dizziness or other problems. Drain drsg dry. Scalp incision with staples, no erythema, swelling, or drainage. PEARL. MAEW.  Hgb 10.6, Hct 30.7, PLT "consistent with previous"; all essentially unchanged.  Lab Results:  Recent Labs  10/23/13 0245 10/24/13 0212  WBC 3.0* 4.4  HGB 10.6* 10.6*  HCT 31.1* 30.7*  PLT 64* CONSISTENT WITH PREVIOUS RESULT   BMET  Recent Labs  10/23/13 0245 10/24/13 0212  NA 138 133*  K 4.1 4.3  CL 107 101  CO2 25 25  GLUCOSE 99 121*  BUN 9 6  CREATININE 0.65 0.60  CALCIUM 8.0* 8.0*    Studies/Results: No results found.  Assessment/Plan:    LOS: 3 days  Mobilize with PT/OT.   Georgiann Cockeroteat, Schneur Crowson 10/24/2013, 9:08 AM

## 2013-10-24 NOTE — Plan of Care (Signed)
Problem: Consults Goal: Diagnosis - Craniotomy Outcome: Completed/Met Date Met:  10/24/13 Subdural hematoma

## 2013-10-24 NOTE — Progress Notes (Signed)
Occupational Therapy Evaluation Patient Details Name: Gary CrossRandall S Brickle MRN: [id number] DOB: 06/07/1960 Today's Date: 10/24/2013    History of Present Illness 53 y.o M who has a pmh of substance abuse and recent ED visits for acute intoxication who presents with R subdural bleed after fall.Underwent craniotomy and SDH evacuation 10/06.   Clinical Impression   PTA, pt lived in "shack" on son's property (used bathroom in son's house) and was independent with ADL/mobility until @ 2 weeks ago due to L sided weakness. Pt making excellent progress. Pt with apparent mild L inattention. LUE strength is improving - functional use of LUE. Feel pt can D/C home if 24/7 S is available. Pt states this can be arranged. Pt will benefit from skilled OT services to facilitate D/C home due to below deficits. Will further assess cognition and vision.     Follow Up Recommendations  Home health OT;Supervision/Assistance - 24 hour    Equipment Recommendations  None recommended by OT    Recommendations for Other Services  ETOH counseling     Precautions / Restrictions Precautions Precautions: Fall Precaution Comments: mild L inattention      Mobility Bed Mobility Overal bed mobility: Modified Independent                Transfers Overall transfer level: Needs assistance Equipment used: 1 person hand held assist Transfers: Sit to/from Stand;Stand Pivot Transfers Sit to Stand: Supervision Stand pivot transfers: Min guard       General transfer comment: no balance deficits noted    Balance Overall balance assessment: No apparent balance deficits (not formally assessed)                                          ADL Overall ADL's : Needs assistance/impaired     Grooming: Set up;Supervision/safety;Standing   Upper Body Bathing: Set up;Supervision/ safety;Standing   Lower Body Bathing: Min guard;Sit to/from stand   Upper Body Dressing : Set up;Sitting   Lower Body  Dressing: Min guard;Sit to/from stand   Toilet Transfer: Min guard   Toileting- ArchitectClothing Manipulation and Hygiene: Supervision/safety;Sit to/from stand       Functional mobility during ADLs: Min guard General ADL Comments: Noted mild L inattention during ADL task     Vision                 Additional Comments: Pt states vision has improved   Perception Perception Comments: will further assess. L mild inattention   Praxis Praxis Praxis tested?: Within functional limits    Pertinent Vitals/Pain Pain Assessment: No/denies pain     Hand Dominance Left   Extremity/Trunk Assessment Upper Extremity Assessment Upper Extremity Assessment: LUE deficits/detail LUE Deficits / Details: generalized weakness. mild L inattention LUE Sensation: decreased light touch (states it is better but feels "tingly") LUE Coordination:  (functional)   Lower Extremity Assessment Lower Extremity Assessment: Defer to PT evaluation   Cervical / Trunk Assessment Cervical / Trunk Assessment: Normal   Communication Communication Communication: No difficulties   Cognition Arousal/Alertness: Awake/alert Behavior During Therapy: WFL for tasks assessed/performed Overall Cognitive Status: No family/caregiver present to determine baseline cognitive functioning (will further assess)                     General Comments       Exercises       Shoulder Instructions  Home Living Family/patient expects to be discharged to:: Private residence Living Arrangements: Alone Available Help at Discharge: Family;Available 24 hours/day Type of Home: House Home Access: Stairs to enter Entergy CorporationEntrance Stairs-Number of Steps: 1   Home Layout: One level     Bathroom Shower/Tub: Tub/shower unit Shower/tub characteristics: Engineer, building servicesCurtain Bathroom Toilet: Standard Bathroom Accessibility: Yes How Accessible: Accessible via walker Home Equipment: Walker - 2 wheels;Walker - 4 wheels;Cane - single  point;Shower seat - built in;Hand held shower head          Prior Functioning/Environment Level of Independence: Independent        Comments: hOWEVER, 2 WEEKS PROPR TO ADMISSION WAS USING A WALKER DUE TO INCREASED DIFFICULTY WITH WALKING    OT Diagnosis: Generalized weakness;Cognitive deficits   OT Problem List: Decreased strength;Impaired vision/perception;Decreased safety awareness;Impaired sensation;Decreased cognition   OT Treatment/Interventions: Self-care/ADL training;Therapeutic exercise;Neuromuscular education;DME and/or AE instruction;Therapeutic activities;Visual/perceptual remediation/compensation;Cognitive remediation/compensation;Patient/family education    OT Goals(Current goals can be found in the care plan section) Acute Rehab OT Goals Patient Stated Goal: to go home OT Goal Formulation: With patient Time For Goal Achievement: 11/07/13 Potential to Achieve Goals: Good  OT Frequency: Min 3X/week   Barriers to D/C: Other (comment) (unsure of available support)          Co-evaluation              End of Session Equipment Utilized During Treatment: Gait belt Nurse Communication: Mobility status  Activity Tolerance: Patient tolerated treatment well Patient left: in chair;with call bell/phone within reach;with chair alarm set   Time: 1005-1036 OT Time Calculation (min): 31 min Charges:  OT General Charges $OT Visit: 1 Procedure OT Evaluation $Initial OT Evaluation Tier I: 1 Procedure OT Treatments $Self Care/Home Management : 23-37 mins G-Codes:    Prerna Harold,HILLARY 10/24/2013, 11:09 AM   Luisa DagoHilary Miliano Cotten, OTR/L  [phone] 10/24/2013

## 2013-10-24 NOTE — Progress Notes (Signed)
PULMONARY / CRITICAL CARE MEDICINE   Name: Gary [name] MRN: [id number] DOB: 01/19/1960    ADMISSION DATE:  10/21/2013 CONSULTATION DATE:  10/21/2013  REFERRING MD :  OSH  CHIEF COMPLAINT:  SDH  PRESENTATION/COURSE: 53 yo with cirrhosis presented to [place] 10/6 after being found down at home. He was found to have large SDH and [facility]. PCCM to admit for ICU/ vent management. Extubated in ICU same day as surgery. No post op or ICU complications. Platelets transfused 10/08 and drain removed. Transfer to med-surg ordered 10/09  STUDIES:  10/7 CT (post op) > considerable SDH remains, drain in good position.  SUBJECTIVE:  No new complaints. No distress  VITAL SIGNS: Temp:  [97.8 F (36.6 C)-100.1 F (37.8 C)] 98.7 F (37.1 C) (10/09 1132) Pulse Rate:  [57-89] 73 (10/09 1000) Resp:  [14-27] 27 (10/09 1000) BP: (109-141)/(63-94) 118/94 mmHg (10/09 1000) SpO2:  [94 %-100 %] 98 % (10/09 1000) Weight:  [98.4 kg (216 lb 14.9 oz)] 98.4 kg (216 lb 14.9 oz) (10/09 0500) HEMODYNAMICS:   VENTILATOR SETTINGS:   INTAKE / OUTPUT:  Intake/Output Summary (Last 24 hours) at 10/24/13 1147 Last data filed at 10/24/13 1100  Gross per 24 hour  Intake   1740 ml  Output   4975 ml  Net  -3235 ml    Physical Examination:  General: awake alert cooperative  Neuro: Alert, oriented, no focal deficit HEENT: post op changes and drain Cardiovascular: RRR, no M Lungs: CTAB, normal WOB  Abdomen: Non-tender non-distended, BS normoactive.   Musculoskeletal: No acute deformity or ROM limitation Skin: mult tattoos on forearms noted, mildly jaundiced  LABS: I have reviewed all of today's lab results. Relevant abnormalities are discussed in the A/P section  ASSESSMENT / PLAN: SDH, s/p evacuation Alcoholic Cirrhosis - LFT abnormalities have resolved Thrombocytopenia  Alcoholic coagulopathy - prolonged prothrombin time ETOH use, no evidence of WD   Cont  current Rx.  Transfer to med-surg TR[facility] to assume care in AM 10/10 and PCCM to sign off - discussed with Dr Joseph ArtWoods NS to continue to follow  Gary Fischeravid Gilles Trimpe, MD;  PCCM service; Mobile [phone]

## 2013-10-24 NOTE — Evaluation (Addendum)
Physical Therapy Evaluation Patient Details Name: Gary [name] MRN: [id number] DOB: 12/25/1960 Today's Date: 10/24/2013   History of Present Illness  53 y.o M who has a pmh of substance abuse and recent ED visits for acute intoxication who presents with R subdural bleed after fall.Underwent craniotomy and SDH evacuation 10/06.  Clinical Impression  Patient demonstrates deficits in functional mobility as indicated below. Will benefit from continued skilled PT to address deficits and maximize function. Will see as indicated and progress as tolerated.  Spoke with patient regarding mobility and initial supervision for return home. Anticipate patient will continue to progress well.     Follow Up Recommendations Home health PT;Supervision/Assistance - 24 hour (initially)    Equipment Recommendations  None recommended by PT    Recommendations for Other Services       Precautions / Restrictions Precautions Precautions: Fall Precaution Comments: mild L inattention      Mobility  Bed Mobility Overal bed mobility: Modified Independent                Transfers Overall transfer level: Needs assistance Equipment used: 1 person hand held assist Transfers: Sit to/from Stand Sit to Stand: Supervision Stand pivot transfers: Min guard       General transfer comment: no balance deficits noted  Ambulation/Gait Ambulation/Gait assistance: Min guard Ambulation Distance (Feet): 180 Feet Assistive device: None Gait Pattern/deviations: Step-through pattern;Decreased stride length;Staggering right;Narrow base of support Gait velocity: decreased Gait velocity interpretation: Below normal speed for age/gender General Gait Details: some instability with right lateral drift, able to self correct   Stairs            Wheelchair Mobility    Modified Rankin (Stroke Patients Only) Modified Rankin (Stroke Patients Only) Pre-Morbid Rankin Score: No symptoms Modified Rankin: Moderate  disability     Balance Overall balance assessment: No apparent balance deficits (not formally assessed)                               Standardized Balance Assessment Standardized Balance Assessment : Berg Balance Test Berg Balance Test Sit to Stand: Needs minimal aid to stand or to stabilize Standing Unsupported: Able to stand 30 seconds unsupported Transfers: Able to transfer safely, definite need of hands Standing Unsupported, One Foot in Front: Needs help to step but can hold 15 seconds Standing on One Leg: Able to lift leg independently and hold 5-10 seconds         Pertinent Vitals/Pain      Home Living Family/patient expects to be discharged to:: Private residence Living Arrangements: Alone Available Help at Discharge: Family;Available 24 hours/day Type of Home: House Home Access: Stairs to enter   Entergy CorporationEntrance Stairs-Number of Steps: 1 Home Layout: One level Home Equipment: Walker - 2 wheels;Walker - 4 wheels;Cane - single point;Shower seat - built in;Hand held shower head      Prior Function Level of Independence: Independent         Comments: hOWEVER, 2 WEEKS PROPR TO ADMISSION WAS USING A WALKER DUE TO INCREASED DIFFICULTY WITH WALKING     Hand Dominance   Dominant Hand: Left    Extremity/Trunk Assessment   Upper Extremity Assessment: Defer to OT evaluation           Lower Extremity Assessment: Overall WFL for tasks assessed      Cervical / Trunk Assessment: Normal  Communication   Communication: No difficulties  Cognition Arousal/Alertness: Awake/alert Behavior During [facility]  for tasks assessed/performed Overall Cognitive Status: No family/caregiver present to determine baseline cognitive functioning (will further assess)                      General Comments      Exercises        Assessment/Plan    PT Assessment Patient needs continued PT services  PT Diagnosis Difficulty walking;Abnormality of  gait;Generalized weakness   PT Problem List Decreased strength;Decreased activity tolerance;Decreased balance;Decreased mobility;Decreased coordination  PT Treatment Interventions DME instruction;Gait training;Stair training;Functional mobility training;Therapeutic activities;Therapeutic exercise;Balance training;Patient/family education   PT Goals (Current goals can be found in the Care Plan section) Acute Rehab PT Goals Patient Stated Goal: to go home PT Goal Formulation: With patient Time For Goal Achievement: 11/07/13 Potential to Achieve Goals: Good    Frequency Min 4X/week   Barriers to discharge        Co-evaluation               End of Session Equipment Utilized During Treatment: Gait belt Activity Tolerance: Patient tolerated treatment well Patient left: in bed;with call bell/phone within reach Nurse Communication: Mobility status         Time: [id number] PT Time Calculation (min): 26 min   Charges:   PT Evaluation $Initial PT Evaluation Tier I: 1 Procedure PT Treatments $Gait Training: 8-22 mins $Self Care/Home Management: 8-22   PT G CodesFabio Asa:          Garland Smouse J 10/24/2013, 5:40 PM Charlotte Crumbevon Jehieli Brassell, PT DPT  [phone]

## 2013-10-25 DIAGNOSIS — K7030 Alcoholic cirrhosis of liver without ascites: Secondary | ICD-10-CM

## 2013-10-25 MED ORDER — FUROSEMIDE 40 MG PO TABS
40.0000 mg | ORAL_TABLET | Freq: Every day | ORAL | Status: DC
  Administered 2013-10-25: 40 mg via ORAL
  Filled 2013-10-25: qty 1

## 2013-10-25 MED ORDER — LEVETIRACETAM 500 MG PO TABS
500.0000 mg | ORAL_TABLET | Freq: Two times a day (BID) | ORAL | Status: DC
  Filled 2013-10-25 (×2): qty 1

## 2013-10-25 MED ORDER — PROPRANOLOL HCL 20 MG PO TABS
20.0000 mg | ORAL_TABLET | Freq: Two times a day (BID) | ORAL | Status: DC
  Administered 2013-10-25: 20 mg via ORAL
  Filled 2013-10-25 (×2): qty 1

## 2013-10-25 MED ORDER — SPIRONOLACTONE 25 MG PO TABS
25.0000 mg | ORAL_TABLET | Freq: Two times a day (BID) | ORAL | Status: DC
  Administered 2013-10-25: 25 mg via ORAL
  Filled 2013-10-25 (×3): qty 1

## 2013-10-25 MED ORDER — PANTOPRAZOLE SODIUM 40 MG PO TBEC
40.0000 mg | DELAYED_RELEASE_TABLET | Freq: Every day | ORAL | Status: DC
  Administered 2013-10-25: 40 mg via ORAL
  Filled 2013-10-25: qty 1

## 2013-10-25 NOTE — Progress Notes (Signed)
Patient ID: Gary [name], male   DOB: 02/26/1960, [age] y.o.   MRN: [id number] Subjective:  The patient is alert and pleasant. He is in no apparent distress. He has no complaints.  Objective: Vital signs in last 24 hours: Temp:  [97.8 F (36.6 C)-99.2 F (37.3 C)] 98.6 F (37 C) (10/10 0036) Pulse Rate:  [62-73] 62 (10/09 1215) Resp:  [15-27] 15 (10/09 1215) BP: (109-137)/(63-105) 135/105 mmHg (10/09 2000) SpO2:  [95 %-100 %] 100 % (10/09 2000)  Intake/Output from previous day: 10/09 0701 - 10/10 0700 In: 340 [P.O.:240; IV Piggyback:100] Out: 5075 [Urine:5075] Intake/Output this shift: Total I/O In: 240 [P.O.:240] Out: 2325 [Urine:2325]  Physical exam the patient is alert and oriented x3. His wound is healing well. He is moving all 4 extremities well  Lab Results:  Recent Labs  10/23/13 0245 10/24/13 0212  WBC 3.0* 4.4  HGB 10.6* 10.6*  HCT 31.1* 30.7*  PLT 64* CONSISTENT WITH PREVIOUS RESULT   BMET  Recent Labs  10/23/13 0245 10/24/13 0212  NA 138 133*  K 4.1 4.3  CL 107 101  CO2 25 25  GLUCOSE 99 121*  BUN 9 6  CREATININE 0.65 0.60  CALCIUM 8.0* 8.0*    Studies/Results: No results found.  Assessment/Plan: Postop day #4: The patient is doing well. I will transfer him to the floor. He may go home tomorrow.  LOS: 4 days     Mccayla Shimada D 10/25/2013, 6:11 AM
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
## 2013-10-26 NOTE — Care Management Note (Addendum)
  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            Status of service:   Medicare Important Message given?   (If response is "NO", the following Medicare IM given date fields will be blank) Date Medicare IM given:   Medicare IM given by:   Date Additional Medicare IM given:   Additional Medicare IM given by:    Discharge Disposition: home   Per UR Regulation:    If discussed at Long Length of Stay Meetings, dates discussed:    Comments:  10/26/13 CM called [facility] rep, Stephanie to [facility]  Judeth CornfieldStephanie states she received referral yesterday from ED CM. OP PT/OT referral faxed [phone]. Patient given information to contact regard appointment.   No other CM needs were communicated.  Gary [name], BSN, CM [phone].

## 2013-10-27 ENCOUNTER — Emergency Department (HOSPITAL_COMMUNITY): Payer: Medicaid Other

## 2013-10-27 ENCOUNTER — Observation Stay (HOSPITAL_COMMUNITY)
Admission: EM | Admit: 2013-10-27 | Discharge: 2013-10-28 | Disposition: A | Discharge status: Home / Self Care | Payer: Medicaid Other | Attending: Internal Medicine | Admitting: Internal Medicine

## 2013-10-27 ENCOUNTER — Encounter (HOSPITAL_COMMUNITY): Payer: Self-pay | Admitting: Emergency Medicine

## 2013-10-27 DIAGNOSIS — K7460 Unspecified cirrhosis of liver: Secondary | ICD-10-CM | POA: Diagnosis present

## 2013-10-27 DIAGNOSIS — D649 Anemia, unspecified: Secondary | ICD-10-CM | POA: Diagnosis present

## 2013-10-27 DIAGNOSIS — S065XAA Traumatic subdural hemorrhage with loss of consciousness status unknown, initial encounter: Secondary | ICD-10-CM | POA: Diagnosis present

## 2013-10-27 DIAGNOSIS — R569 Unspecified convulsions: Secondary | ICD-10-CM | POA: Diagnosis present

## 2013-10-27 DIAGNOSIS — Z79899 Other long term (current) drug therapy: Secondary | ICD-10-CM | POA: Diagnosis not present

## 2013-10-27 DIAGNOSIS — S065X9A Traumatic subdural hemorrhage with loss of consciousness of unspecified duration, initial encounter: Secondary | ICD-10-CM | POA: Diagnosis present

## 2013-10-27 DIAGNOSIS — R509 Fever, unspecified: Secondary | ICD-10-CM

## 2013-10-27 DIAGNOSIS — I6200 Nontraumatic subdural hemorrhage, unspecified: Secondary | ICD-10-CM | POA: Diagnosis not present

## 2013-10-27 DIAGNOSIS — K759 Inflammatory liver disease, unspecified: Secondary | ICD-10-CM | POA: Diagnosis not present

## 2013-10-27 LAB — CBG MONITORING, ED: GLUCOSE-CAPILLARY: 84 mg/dL (ref 70–99)

## 2013-10-27 LAB — COMPREHENSIVE METABOLIC PANEL
ALBUMIN: 3.1 g/dL — AB (ref 3.5–5.2)
ALT: 20 U/L (ref 0–53)
ANION GAP: 12 (ref 5–15)
AST: 45 U/L — ABNORMAL HIGH (ref 0–37)
Alkaline Phosphatase: 116 U/L (ref 39–117)
BUN: 14 mg/dL (ref 6–23)
CO2: 26 mEq/L (ref 19–32)
Calcium: 9.1 mg/dL (ref 8.4–10.5)
Chloride: 100 mEq/L (ref 96–112)
Creatinine, Ser: 0.73 mg/dL (ref 0.50–1.35)
GFR calc non Af Amer: >90 mL/min (ref >90)
GLUCOSE: 86 mg/dL (ref 70–99)
POTASSIUM: 4.4 meq/L (ref 3.7–5.3)
SODIUM: 138 meq/L (ref 137–147)
TOTAL PROTEIN: 6.7 g/dL (ref 6.0–8.3)
Total Bilirubin: 2.3 mg/dL — ABNORMAL HIGH (ref 0.3–1.2)

## 2013-10-27 LAB — PROTIME-INR
INR: 1.39 (ref 0.00–1.49)
Prothrombin Time: 17.1 seconds — ABNORMAL HIGH (ref 11.6–15.2)

## 2013-10-27 LAB — CBC
HEMATOCRIT: 35.3 % — AB (ref 39.0–52.0)
HEMOGLOBIN: 12.3 g/dL — AB (ref 13.0–17.0)
MCH: 33.8 pg (ref 26.0–34.0)
MCHC: 34.8 g/dL (ref 30.0–36.0)
MCV: 97 fL (ref 78.0–100.0)
Platelets: 69 10*3/uL — ABNORMAL LOW (ref 150–400)
RBC: 3.64 MIL/uL — AB (ref 4.22–5.81)
RDW: 14.5 % (ref 11.5–15.5)
WBC: 4.8 10*3/uL (ref 4.0–10.5)

## 2013-10-27 MED ORDER — LEVETIRACETAM IN NACL 500 MG/100ML IV SOLN
500.0000 mg | Freq: Two times a day (BID) | INTRAVENOUS | Status: DC
  Administered 2013-10-28: 500 mg via INTRAVENOUS
  Filled 2013-10-27: qty 100

## 2013-10-27 MED ORDER — RIFAXIMIN 550 MG PO TABS
550.0000 mg | ORAL_TABLET | Freq: Two times a day (BID) | ORAL | Status: DC
  Administered 2013-10-28 (×2): 550 mg via ORAL
  Filled 2013-10-27 (×3): qty 1

## 2013-10-27 MED ORDER — ONDANSETRON HCL 4 MG/2ML IJ SOLN: 4.0000 mg | Freq: Four times a day (QID) | INTRAMUSCULAR | Status: DC | PRN

## 2013-10-27 MED ORDER — PROPRANOLOL HCL 20 MG PO TABS
20.0000 mg | ORAL_TABLET | Freq: Two times a day (BID) | ORAL | Status: DC
  Administered 2013-10-28 (×2): 20 mg via ORAL
  Filled 2013-10-27 (×3): qty 1

## 2013-10-27 MED ORDER — PANTOPRAZOLE SODIUM 40 MG PO TBEC
40.0000 mg | DELAYED_RELEASE_TABLET | Freq: Every day | ORAL | Status: DC
  Administered 2013-10-28: 40 mg via ORAL
  Filled 2013-10-27: qty 1

## 2013-10-27 MED ORDER — ACETAMINOPHEN 650 MG RE SUPP: 650.0000 mg | Freq: Four times a day (QID) | RECTAL | Status: DC | PRN

## 2013-10-27 MED ORDER — LORAZEPAM 2 MG/ML IJ SOLN
INTRAMUSCULAR | Status: AC
  Filled 2013-10-27: qty 1

## 2013-10-27 MED ORDER — SPIRONOLACTONE 25 MG PO TABS
25.0000 mg | ORAL_TABLET | Freq: Two times a day (BID) | ORAL | Status: DC
  Administered 2013-10-28: 25 mg via ORAL
  Filled 2013-10-27 (×3): qty 1

## 2013-10-27 MED ORDER — LACTULOSE 10 GM/15ML PO SOLN
20.0000 g | Freq: Every day | ORAL | Status: DC
  Administered 2013-10-28: 20 g via ORAL
  Filled 2013-10-27: qty 30

## 2013-10-27 MED ORDER — ACETAMINOPHEN 325 MG PO TABS: 650.0000 mg | ORAL_TABLET | Freq: Four times a day (QID) | ORAL | Status: DC | PRN

## 2013-10-27 MED ORDER — SODIUM CHLORIDE 0.9 % IJ SOLN
3.0000 mL | Freq: Two times a day (BID) | INTRAMUSCULAR | Status: DC
  Administered 2013-10-28 (×2): 3 mL via INTRAVENOUS

## 2013-10-27 MED ORDER — FUROSEMIDE 80 MG PO TABS
80.0000 mg | ORAL_TABLET | Freq: Every day | ORAL | Status: DC
  Administered 2013-10-28: 80 mg via ORAL
  Filled 2013-10-27: qty 1

## 2013-10-27 MED ORDER — LORAZEPAM 2 MG/ML IJ SOLN
2.0000 mg | Freq: Once | INTRAMUSCULAR | Status: AC
  Administered 2013-10-27: 2 mg via INTRAVENOUS

## 2013-10-27 MED ORDER — LEVETIRACETAM IN NACL 1000 MG/100ML IV SOLN
1000.0000 mg | Freq: Once | INTRAVENOUS | Status: AC
  Administered 2013-10-27: 1000 mg via INTRAVENOUS
  Filled 2013-10-27: qty 100

## 2013-10-27 MED ORDER — SERTRALINE HCL 100 MG PO TABS
100.0000 mg | ORAL_TABLET | Freq: Every day | ORAL | Status: DC
  Administered 2013-10-28: 100 mg via ORAL
  Filled 2013-10-27 (×2): qty 1

## 2013-10-27 MED ORDER — ONDANSETRON HCL 4 MG PO TABS: 4.0000 mg | ORAL_TABLET | Freq: Four times a day (QID) | ORAL | Status: DC | PRN

## 2013-10-27 MED ORDER — DICYCLOMINE HCL 10 MG PO CAPS
10.0000 mg | ORAL_CAPSULE | Freq: Three times a day (TID) | ORAL | Status: DC | PRN
  Filled 2013-10-27: qty 1

## 2013-10-27 NOTE — ED Notes (Signed)
Admitting MD at bedside.

## 2013-10-27 NOTE — H&P (Addendum)
[facility] History and Physical  Gary CrossRandall S Biddinger AOZ:[id number] DOB: 06/04/1960 DOA: 10/27/2013  Referring physician: ER physician. PCP: Rodman KeyHarrison, Dawn S, FNP   Chief Complaint: Seizures.  HPI: Gary [name] is a 53 y.o. male with history of cirrhosis of liver, hepatitis C, chronic anemia who was recently admitted for subdural hematoma and had craniotomy last week was brought to the ER after patient's family witnessed seizure. As per the ER physician patient had a seizure and fell on the ground. In the ER CT head did not show anything acute but did show patient's known subdural hematoma which did not show any worsening. ER physician did discuss with on-call neurosurgeon and plans was to discharge home on Keppra when patient had another episode of tonic-clonic seizures which lasted for around 2 minutes and patient was postictal for 20 minutes. Patient was loaded with Keppra and admitted for further observation. On my exam patient has become more alert and oriented but still appears confused. Patient is able to move all extremities without any difficulty. Patient denies any chest pain or shortness of breath nausea vomiting or diarrhea. Patient states he has not had any alcohol for more than 2 months.   Review of Systems: As presented in the history of presenting illness, rest negative.  Past Medical History  Diagnosis Date  . Hepatitis   . Cirrhosis    Past Surgical History  Procedure Laterality Date  . Craniotomy Right 10/21/2013    Procedure: CRANIOTOMY HEMATOMA EVACUATION SUBDURAL;  Surgeon: Maeola HarmanJoseph Stern, MD;  Location: [facility];  Service: Neurosurgery;  Laterality: Right;   Social History:  reports that he has never smoked. He does not have any smokeless tobacco history on file. He reports that he drinks alcohol. He reports that he uses illicit drugs (Cocaine). Where does patient live home. Can patient participate in ADLs? Yes.  No Known Allergies  Family History:   Family History  Problem Relation Age of Onset  . Cirrhosis Neg Hx   . Seizures Neg Hx       Prior to Admission medications   Medication Sig Start Date End Date Taking? Authorizing Provider  acetaminophen (TYLENOL) 500 MG tablet Take 1,000 mg by mouth every 6 (six) hours as needed for moderate pain.   Yes Historical Provider, MD  dexlansoprazole (DEXILANT) 60 MG capsule Take 60 mg by mouth daily.   Yes Historical Provider, MD  dicyclomine (BENTYL) 10 MG capsule Take 10 mg by mouth 3 (three) times daily as needed for spasms.    Yes Historical Provider, MD  furosemide (LASIX) 40 MG tablet Take 80 mg by mouth daily. 02/20/13  Yes Historical Provider, MD  lactulose (CHRONULAC) 10 GM/15ML solution Take 20 g by mouth daily.    Yes Historical Provider, MD  propranolol (INDERAL) 20 MG tablet Take 20 mg by mouth 2 (two) times daily.   Yes Historical Provider, MD  rifaximin (XIFAXAN) 550 MG TABS tablet Take 550 mg by mouth 2 (two) times daily.   Yes Historical Provider, MD  sertraline (ZOLOFT) 100 MG tablet Take 100 mg by mouth at bedtime.    Yes Historical Provider, MD  spironolactone (ALDACTONE) 25 MG tablet Take 25 mg by mouth 2 (two) times daily.   Yes Historical Provider, MD    Physical Exam: Filed Vitals:   10/27/13 2215 10/27/13 2230 10/27/13 2308 10/27/13 2333  BP: 106/59 124/88 132/73 113/82  Pulse: 66 71 63 66  Temp:   98.4 F (36.9 C) 99.1 F (37.3 C)  TempSrc:   Oral   Resp: 17 21 19    Height:      Weight:      SpO2: 95% 97% 100% 100%     General:  Moderately built and nourished.  Eyes: Anicteric no pallor.  ENT: No discharge from the ears eyes nose mouth.  Neck: No mass felt. No neck rigidity.  Cardiovascular: S1-S2 heard.  Respiratory: No rhonchi or crepitations.  Abdomen: Soft nontender bowel sounds present. No guarding or rigidity.  Skin: Suture seen on scalp.  Musculoskeletal: Mild edema.  Psychiatric: Confused but otherwise normal.  Neurologic: Patient is  alert oriented to his name and place and time but appears mildly confused. Moves all extremities.  Labs on Admission:  Basic Metabolic Panel:  Recent Labs Lab 10/21/13 1818 10/22/13 0510 10/23/13 0245 10/24/13 0212 10/27/13 1748  NA 141 140 138 133* 138  K 4.3 3.6* 4.1 4.3 4.4  CL 104 106 107 101 100  CO2 26 23 25 25 26   GLUCOSE 75 82 99 121* 86  BUN 14 12 9 6 14   CREATININE 0.73 0.64 0.65 0.60 0.73  CALCIUM 8.4 7.7* 8.0* 8.0* 9.1  MG  --  1.9  --   --   --   PHOS  --  2.7  --   --   --    Liver Function Tests:  Recent Labs Lab 10/21/13 1818 10/22/13 1400 10/24/13 0212 10/27/13 1748  AST 62*  --  44* 45*  ALT 28  --  20 20  ALKPHOS 110  --  119* 116  BILITOT 4.5* 2.7* 1.9* 2.3*  PROT 6.0  --  5.2* 6.7  ALBUMIN 2.6*  --  2.3* 3.1*    Recent Labs Lab 10/21/13 1818  LIPASE 53  AMYLASE 31   No results found for this basename: AMMONIA,  in the last 168 hours CBC:  Recent Labs Lab 10/21/13 1818  10/22/13 0510 10/22/13 1400 10/23/13 0245 10/24/13 0212 10/27/13 1717  WBC 5.9  < > 3.3* 2.7* 3.0* 4.4 4.8  NEUTROABS 2.1  --   --   --   --   --   --   HGB 12.8*  < > 10.4* 10.6* 10.6* 10.6* 12.3*  HCT 37.2*  < > 29.7* 31.2* 31.1* 30.7* 35.3*  MCV 99.7  < > 97.7 98.7 98.4 97.2 97.0  PLT 57*  < > 61* 70* 64* CONSISTENT WITH PREVIOUS RESULT 69*  < > = values in this interval not displayed. Cardiac Enzymes:  Recent Labs Lab 10/21/13 1818 10/22/13 0010 10/22/13 0510  TROPONINI <0.30 <0.30 <0.30    BNP (last 3 results) No results found for this basename: PROBNP,  in the last 8760 hours CBG:  Recent Labs Lab 10/27/13 1745  GLUCAP 84    Radiological Exams on Admission: Ct Head Wo Contrast   (if New Onset Seizure And/or Head Trauma)  10/27/2013   CLINICAL DATA:  Status post seizure with a fall and a blow to the back of the head. History of subdural hematoma on the right; status post evacuation 10/21/2013.  EXAM: CT HEAD WITHOUT CONTRAST  TECHNIQUE:  Contiguous axial images were obtained from the base of the skull through the vertex without intravenous contrast.  COMPARISON:  Head CT scan 10/22/2013 and 10/21/2013.  FINDINGS: Subdural drainage catheter on the right has been removed since the most recent head CT scan. Craniotomy defect on the right is again seen. There is a small amount of right pneumocephalus. A mixed attenuation extra-axial  fluid collection over the right convexities measures 2.2 cm in diameter compared to 2.5 cm on the most recent head CT. Right to left midline shift of 0.5 cm is unchanged. There is no hydrocephalus. No subarachnoid hemorrhage or evidence of acute infarction is identified. No fracture is seen.  IMPRESSION: Subacute subdural hematoma on the right with a 0.5 cm right to left midline shift, unchanged. Negative for new hemorrhage.   Electronically Signed   By: Drusilla Kanner M.D.   On: 10/27/2013 18:41    EKG: Independently reviewed. Normal sinus rhythm.  Assessment/Plan Active Problems:   Subdural hematoma   Cirrhosis   Seizure   Chronic anemia   1. Seizures with recent admission for subdural hematoma and has had craniotomy - at this time he did discuss with on-call urologist Dr. Hosie Poisson and was advised to continue Keppra for now and followup with neurology as outpatient. Patient will be admitted for observation. 2. Recent subdural hematoma status post craniotomy - CT head does not show any worsening. 3. Cirrhosis of the liver - continue diuretics. 4. Chronic anemia - follow CBC. 5. History of hepatitis C - further workup as outpatient.  Chest x-ray and UA are pending.  Code Status: Full code.  Family Communication: None.  Disposition Plan: Admit for observation.    Draken Farrior N. [facility] Pager [phone].  If 7PM-7AM, please contact night-coverage [email or web address] Password [email or web address] 10/27/2013, 11:46 PM

## 2013-10-27 NOTE — ED Notes (Signed)
Pt had witnessed seizure, lasting roughly 60 seconds. 2 mg ativan given, gentry MD and resident at bedside. Airway intact, VSS, pt post ictal.

## 2013-10-27 NOTE — ED Notes (Signed)
Spoke with pt's son after the pt gave this RN to do so, updated family member with pt's status.

## 2013-10-27 NOTE — ED Notes (Signed)
Pt. Was sitting at home talking to his sister - in -law.  She noticed his hand began to shake and pt. Fell backward and hit a wood floor and  Had a gran-mal seizure, reported by his sister-in law.  Pt. Is alert and oriented X4, no incontinence or oral injury.   Pt. Is 1 week post-op from surgery of a sub-dural hematoma this past Tuesday.  Pt. Was discharged home on Saturday.  Pt. Has staples in his head.   Dried blood noted to one of the staples after hitting his head today.  Pt. Does not remember the seizure.  Pt. Has a mild headache behind his rt. Eye.  CBG 120

## 2013-10-27 NOTE — ED Notes (Signed)
[facility] placed on a NRB when it was noted the pt was seizing.

## 2013-10-27 NOTE — ED Provider Notes (Signed)
CSN: [id number]     Arrival date & time 10/27/13  1641 History   First MD Initiated Contact with Patient 10/27/13 1700     Chief Complaint  Patient presents with  . Seizures     (Consider location/radiation/quality/duration/timing/severity/associated sxs/prior Treatment) Patient is a 53 y.o. male presenting with seizures.  Seizures Seizure activity on arrival: no   Seizure type:  Grand mal Preceding symptoms comment:  R hand shaking Initial focality:  Upper extremity Episode characteristics: abnormal movements, disorientation, focal shaking and unresponsiveness   Postictal symptoms: confusion and somnolence   Return to baseline: yes   Severity:  Moderate Duration: brief. Timing:  Once Number of seizures this episode:  1 Progression:  Resolved Context comment:  Recent subdural   Past Medical History  Diagnosis Date  . Hepatitis   . Cirrhosis    Past Surgical History  Procedure Laterality Date  . Craniotomy Right 10/21/2013    Procedure: CRANIOTOMY HEMATOMA EVACUATION SUBDURAL;  Surgeon: Maeola HarmanJoseph Stern, MD;  Location: [facility];  Service: Neurosurgery;  Laterality: Right;   No family history on file. History  Substance Use Topics  . Smoking status: Never Smoker   . Smokeless tobacco: Not on file  . Alcohol Use: Yes    Review of Systems  Neurological: Positive for seizures.  All other systems reviewed and are negative.     Allergies  Review of patient's allergies indicates no known allergies.  Home Medications   Prior to Admission medications   Medication Sig Start Date End Date Taking? Authorizing Provider  acetaminophen (TYLENOL) 500 MG tablet Take 1,000 mg by mouth every 6 (six) hours as needed for moderate pain.   Yes Historical Provider, MD  dexlansoprazole (DEXILANT) 60 MG capsule Take 60 mg by mouth daily.   Yes Historical Provider, MD  dicyclomine (BENTYL) 10 MG capsule Take 10 mg by mouth 3 (three) times daily as needed for spasms.    Yes Historical  Provider, MD  furosemide (LASIX) 40 MG tablet Take 80 mg by mouth daily. 02/20/13  Yes Historical Provider, MD  lactulose (CHRONULAC) 10 GM/15ML solution Take 20 g by mouth daily.    Yes Historical Provider, MD  propranolol (INDERAL) 20 MG tablet Take 20 mg by mouth 2 (two) times daily.   Yes Historical Provider, MD  rifaximin (XIFAXAN) 550 MG TABS tablet Take 550 mg by mouth 2 (two) times daily.   Yes Historical Provider, MD  sertraline (ZOLOFT) 100 MG tablet Take 100 mg by mouth at bedtime.    Yes Historical Provider, MD  spironolactone (ALDACTONE) 25 MG tablet Take 25 mg by mouth 2 (two) times daily.   Yes Historical Provider, MD   BP 132/73  Pulse 63  Temp(Src) 98.4 F (36.9 C) (Oral)  Resp 19  Ht 5\' 10"  (1.778 m)  Wt 216 lb (97.977 kg)  BMI 30.99 kg/m2  SpO2 100% Physical Exam  Vitals reviewed. Constitutional: He is oriented to person, place, and time. He appears well-developed and well-nourished.  HENT:  Head: Normocephalic and atraumatic.  Eyes: Conjunctivae and EOM are normal.  Neck: Normal range of motion. Neck supple.  Cardiovascular: Normal rate, regular rhythm and normal heart sounds.   Pulmonary/Chest: Effort normal and breath sounds normal. No respiratory distress.  Abdominal: He exhibits no distension. There is no tenderness. There is no rebound and no guarding.  Musculoskeletal: Normal range of motion.  Neurological: He is alert and oriented to person, place, and time. He has normal strength and normal reflexes. No cranial  nerve deficit or sensory deficit. Gait normal. GCS eye subscore is 4. GCS verbal subscore is 5. GCS motor subscore is 6.  Skin: Skin is warm and dry.    ED Course  Procedures (including critical care time) Labs Review Labs Reviewed  CBC - Abnormal; Notable for the following:    RBC 3.64 (*)    Hemoglobin 12.3 (*)    HCT 35.3 (*)    Platelets 69 (*)    All other components within normal limits  COMPREHENSIVE METABOLIC PANEL - Abnormal; Notable  for the following:    Albumin 3.1 (*)    AST 45 (*)    Total Bilirubin 2.3 (*)    All other components within normal limits  PROTIME-INR - Abnormal; Notable for the following:    Prothrombin Time 17.1 (*)    All other components within normal limits  CBG MONITORING, ED    Imaging Review Ct Head Wo Contrast   (if New Onset Seizure And/or Head Trauma)  10/27/2013   CLINICAL DATA:  Status post seizure with a fall and a blow to the back of the head. History of subdural hematoma on the right; status post evacuation 10/21/2013.  EXAM: CT HEAD WITHOUT CONTRAST  TECHNIQUE: Contiguous axial images were obtained from the base of the skull through the vertex without intravenous contrast.  COMPARISON:  Head CT scan 10/22/2013 and 10/21/2013.  FINDINGS: Subdural drainage catheter on the right has been removed since the most recent head CT scan. Craniotomy defect on the right is again seen. There is a small amount of right pneumocephalus. A mixed attenuation extra-axial fluid collection over the right convexities measures 2.2 cm in diameter compared to 2.5 cm on the most recent head CT. Right to left midline shift of 0.5 cm is unchanged. There is no hydrocephalus. No subarachnoid hemorrhage or evidence of acute infarction is identified. No fracture is seen.  IMPRESSION: Subacute subdural hematoma on the right with a 0.5 cm right to left midline shift, unchanged. Negative for new hemorrhage.   Electronically Signed   By: Drusilla Kannerhomas  Dalessio M.D.   On: 10/27/2013 18:41     EKG Interpretation None      MDM   Final diagnoses:  Seizure    53 y.o. male with pertinent PMH of recent R SDH which required drain presents with new onset seizure.  Seizure grand mal, preceded by R sided hand movement then fall and seizure for brief period of time with post ictal period.  This occurred approximately 12 PM, the patient did not present until approximately 4 PM. No medications administered. On arrival  the patient has  vital signs and physical exam as above, no focal neurodeficits, complains only of a mild headache.  Labwork and head CT obtained as above. Patient had a approximately 2 minute grand mal seizure while in the ED, requiring 2 mg IV Ativan.  He then had resolution of his seizure, had a postictal period which shortly resolved.  Spoke with neurosurgery who recommended Keppra.  Consulted medicine for admission to ensure the patient didn't have a progressive syndrome or further development of subdural hematoma.  1. Seizure         Mirian MoMatthew Joan Herschberger, MD 10/27/13 [phone]

## 2013-10-28 ENCOUNTER — Observation Stay (HOSPITAL_COMMUNITY): Payer: Medicaid Other

## 2013-10-28 LAB — COMPREHENSIVE METABOLIC PANEL
ALT: 17 U/L (ref 0–53)
ANION GAP: 10 (ref 5–15)
AST: 38 U/L — ABNORMAL HIGH (ref 0–37)
Albumin: 2.5 g/dL — ABNORMAL LOW (ref 3.5–5.2)
Alkaline Phosphatase: 97 U/L (ref 39–117)
BILIRUBIN TOTAL: 2 mg/dL — AB (ref 0.3–1.2)
BUN: 12 mg/dL (ref 6–23)
CALCIUM: 8.6 mg/dL (ref 8.4–10.5)
CHLORIDE: 106 meq/L (ref 96–112)
CO2: 25 mEq/L (ref 19–32)
Creatinine, Ser: 0.67 mg/dL (ref 0.50–1.35)
GFR calc Af Amer: >90 mL/min (ref >90)
Glucose, Bld: 113 mg/dL — ABNORMAL HIGH (ref 70–99)
Potassium: 4.1 mEq/L (ref 3.7–5.3)
Sodium: 141 mEq/L (ref 137–147)
Total Protein: 5.6 g/dL — ABNORMAL LOW (ref 6.0–8.3)

## 2013-10-28 LAB — CBC WITH DIFFERENTIAL/PLATELET
BASOS ABS: 0 10*3/uL (ref 0.0–0.1)
Basophils Relative: 1 % (ref 0–1)
Eosinophils Absolute: 0.2 10*3/uL (ref 0.0–0.7)
Eosinophils Relative: 5 % (ref 0–5)
HCT: 32.5 % — ABNORMAL LOW (ref 39.0–52.0)
Hemoglobin: 11.1 g/dL — ABNORMAL LOW (ref 13.0–17.0)
LYMPHS ABS: 1.3 10*3/uL (ref 0.7–4.0)
LYMPHS PCT: 29 % (ref 12–46)
MCH: 33.2 pg (ref 26.0–34.0)
MCHC: 34.2 g/dL (ref 30.0–36.0)
MCV: 97.3 fL (ref 78.0–100.0)
Monocytes Absolute: 0.9 10*3/uL (ref 0.1–1.0)
Monocytes Relative: 21 % — ABNORMAL HIGH (ref 3–12)
Neutro Abs: 2 10*3/uL (ref 1.7–7.7)
Neutrophils Relative %: 44 % (ref 43–77)
PLATELETS: 63 10*3/uL — AB (ref 150–400)
RBC: 3.34 MIL/uL — AB (ref 4.22–5.81)
RDW: 14.5 % (ref 11.5–15.5)
WBC: 4.5 10*3/uL (ref 4.0–10.5)

## 2013-10-28 LAB — RAPID URINE DRUG SCREEN, HOSP PERFORMED
Amphetamines: NOT DETECTED
BENZODIAZEPINES: NOT DETECTED
Barbiturates: NOT DETECTED
COCAINE: NOT DETECTED
Opiates: NOT DETECTED
Tetrahydrocannabinol: NOT DETECTED

## 2013-10-28 LAB — URINALYSIS, ROUTINE W REFLEX MICROSCOPIC
Bilirubin Urine: NEGATIVE
Glucose, UA: NEGATIVE mg/dL
Hgb urine dipstick: NEGATIVE
Ketones, ur: NEGATIVE mg/dL
LEUKOCYTES UA: NEGATIVE
Nitrite: NEGATIVE
PH: 7.5 (ref 5.0–8.0)
Protein, ur: NEGATIVE mg/dL
SPECIFIC GRAVITY, URINE: 1.012 (ref 1.005–1.030)
Urobilinogen, UA: 1 mg/dL (ref 0.0–1.0)

## 2013-10-28 MED ORDER — LEVETIRACETAM 500 MG PO TABS: 500.0000 mg | ORAL_TABLET | Freq: Two times a day (BID) | ORAL | Status: DC

## 2013-10-28 MED ORDER — LEVETIRACETAM 500 MG PO TABS
500.0000 mg | ORAL_TABLET | Freq: Two times a day (BID) | ORAL | Status: DC
  Filled 2013-10-28: qty 1

## 2013-10-28 NOTE — Progress Notes (Signed)
UR completed 

## 2013-10-28 NOTE — Progress Notes (Signed)
Pt arrived to unit at 2330 in a stretcher, alert and was oriented to room,cleaned up and made comfortable in bed, v/s stable, call light at bed side,will continue to monitor. Obasogie-Asidi, Chontel Warning Efe

## 2013-10-28 NOTE — Discharge Summary (Signed)
PATIENT DETAILS Name: Gary [name] Age: 53 y.o. Sex: male Date of Birth: 12/16/1960 MRN: [id number]. Admitting Physician: Eduard ClosArshad N Kakrakandy, MD QMV:[id number]:Harrison, Burnadette Popawn S, FNP  Admit Date: 10/27/2013 Discharge date: 10/28/2013  Recommendations for Outpatient Follow-up:  1. Note-new medication Keppra 2. Note-outpatient neurology appointment made for patient-please see below.  PRIMARY DISCHARGE DIAGNOSIS:  Active Problems:   Subdural hematoma   Cirrhosis   Seizure   Chronic anemia      PAST MEDICAL HISTORY: Past Medical History  Diagnosis Date  . Hepatitis   . Cirrhosis     DISCHARGE MEDICATIONS:   Medication List         acetaminophen 500 MG tablet  Commonly known as:  TYLENOL  Take 1,000 mg by mouth every 6 (six) hours as needed for moderate pain.     DEXILANT 60 MG capsule  Generic drug:  dexlansoprazole  Take 60 mg by mouth daily.     dicyclomine 10 MG capsule  Commonly known as:  BENTYL  Take 10 mg by mouth 3 (three) times daily as needed for spasms.     furosemide 40 MG tablet  Commonly known as:  LASIX  Take 80 mg by mouth daily.     lactulose 10 GM/15ML solution  Commonly known as:  CHRONULAC  Take 20 g by mouth daily.     levETIRAcetam 500 MG tablet  Commonly known as:  KEPPRA  Take 1 tablet (500 mg total) by mouth 2 (two) times daily.     propranolol 20 MG tablet  Commonly known as:  INDERAL  Take 20 mg by mouth 2 (two) times daily.     sertraline 100 MG tablet  Commonly known as:  ZOLOFT  Take 100 mg by mouth at bedtime.     spironolactone 25 MG tablet  Commonly known as:  ALDACTONE  Take 25 mg by mouth 2 (two) times daily.     XIFAXAN 550 MG Tabs tablet  Generic drug:  rifaximin  Take 550 mg by mouth 2 (two) times daily.        ALLERGIES:  No Known Allergies  BRIEF HPI:  See H&P, Labs, Consult and Test reports for all details in brief, patient is a 53 y.o. male with history of cirrhosis of liver, hepatitis C, chronic anemia  who was recently admitted for subdural hematoma and had craniotomy last week was brought to the ER after patient's family witnessed seizure.  CONSULTATIONS:   None  PERTINENT RADIOLOGIC STUDIES: Ct Head Wo Contrast   (if New Onset Seizure And/or Head Trauma)  10/27/2013   CLINICAL DATA:  Status post seizure with a fall and a blow to the back of the head. History of subdural hematoma on the right; status post evacuation 10/21/2013.  EXAM: CT HEAD WITHOUT CONTRAST  TECHNIQUE: Contiguous axial images were obtained from the base of the skull through the vertex without intravenous contrast.  COMPARISON:  Head CT scan 10/22/2013 and 10/21/2013.  FINDINGS: Subdural drainage catheter on the right has been removed since the most recent head CT scan. Craniotomy defect on the right is again seen. There is a small amount of right pneumocephalus. A mixed attenuation extra-axial fluid collection over the right convexities measures 2.2 cm in diameter compared to 2.5 cm on the most recent head CT. Right to left midline shift of 0.5 cm is unchanged. There is no hydrocephalus. No subarachnoid hemorrhage or evidence of acute infarction is identified. No fracture is seen.  IMPRESSION: Subacute subdural hematoma on the  right with a 0.5 cm right to left midline shift, unchanged. Negative for new hemorrhage.   Electronically Signed   By: Drusilla Kanner M.D.   On: 10/27/2013 18:41   Ct Head Wo Contrast  10/22/2013   CLINICAL DATA:  Postop subdural evacuation.  EXAM: CT HEAD WITHOUT CONTRAST  TECHNIQUE: Contiguous axial images were obtained from the base of the skull through the vertex without contrast.  COMPARISON:  Previous head CT 10/21/2013 from [facility].  FINDINGS: The patient has undergone a small RIGHT frontal craniotomy for subdural evacuation. Subdural drain good position in the frontal compartment. There is considerable residual hyperdense and low attenuation fluid in the subdural space over the frontal and parietal  lobes. Midline shift RIGHT to LEFT is improved, currently measuring 5 mm. Moderate pneumocephalus. No parenchymal hematoma or hydrocephalus.  IMPRESSION: Considerable residual subdural hematoma remains. Subdural drain good position.   Electronically Signed   By: Davonna Belling M.D.   On: 10/22/2013 07:40   Dg Chest Port 1 View  10/28/2013   CLINICAL DATA:  Attached.  Cirrhosis.  EXAM: PORTABLE CHEST - 1 VIEW  COMPARISON:  10/22/2013  FINDINGS: Improved left basilar atelectasis with minimal residual volume loss. Left upper lung zone and right lung are clear. Healing left lateral lower rib fracture. No pneumothorax. Normal heart size.  IMPRESSION: Improved left basilar atelectasis.  Otherwise clear lungs.   Electronically Signed   By: Maryclare Bean M.D.   On: 10/28/2013 07:55   Dg Chest Port 1 View  10/22/2013   CLINICAL DATA:  Extubation.  EXAM: PORTABLE CHEST - 1 VIEW  COMPARISON:  10/21/2013.  FINDINGS: Mediastinum and hilar structures normal. Left lower lobe atelectasis and/or infiltrate noted. No pleural effusion or pneumothorax. Cardiomegaly with normal pulmonary vascularity. No acute bony abnormality.  IMPRESSION: 1. Left lower lobe subsegmental atelectasis and or infiltrate. 2. Cardiomegaly.  Normal pulmonary vascularity.   Electronically Signed   By: Maisie Fus  Register   On: 10/22/2013 08:06   Dg Chest Port 1 View  10/21/2013   CLINICAL DATA:  Short of breath.  Congestion.  EXAM: PORTABLE CHEST - 1 VIEW  COMPARISON:  None.  FINDINGS: Normal heart size. Clear lungs. No pneumothorax. Healing left lateral rib fracture.  IMPRESSION: No active disease.   Electronically Signed   By: Maryclare Bean M.D.   On: 10/21/2013 16:57     PERTINENT LAB RESULTS: CBC:  Recent Labs  10/27/13 1717 10/28/13 0420  WBC 4.8 4.5  HGB 12.3* 11.1*  HCT 35.3* 32.5*  PLT 69* 63*   CMET CMP     Component Value Date/Time   NA 141 10/28/2013 0420   K 4.1 10/28/2013 0420   CL 106 10/28/2013 0420   CO2 25 10/28/2013 0420     GLUCOSE 113* 10/28/2013 0420   BUN 12 10/28/2013 0420   CREATININE 0.67 10/28/2013 0420   CALCIUM 8.6 10/28/2013 0420   PROT 5.6* 10/28/2013 0420   ALBUMIN 2.5* 10/28/2013 0420   AST 38* 10/28/2013 0420   ALT 17 10/28/2013 0420   ALKPHOS 97 10/28/2013 0420   BILITOT 2.0* 10/28/2013 0420   GFRNONAA >90 10/28/2013 0420   GFRAA >90 10/28/2013 0420    GFR Estimated Creatinine Clearance: 120.3 ml/min (by C-G formula based on Cr of 0.67). No results found for this basename: LIPASE, AMYLASE,  in the last 72 hours No results found for this basename: CKTOTAL, CKMB, CKMBINDEX, TROPONINI,  in the last 72 hours No components found with this basename: POCBNP,  No  results found for this basename: DDIMER,  in the last 72 hours No results found for this basename: HGBA1C,  in the last 72 hours No results found for this basename: CHOL, HDL, LDLCALC, TRIG, CHOLHDL, LDLDIRECT,  in the last 72 hours No results found for this basename: TSH, T4TOTAL, FREET3, T3FREE, THYROIDAB,  in the last 72 hours No results found for this basename: VITAMINB12, FOLATE, FERRITIN, TIBC, IRON, RETICCTPCT,  in the last 72 hours Coags:  Recent Labs  10/27/13 1804  INR 1.39   Microbiology: Recent Results (from the past 240 hour(s))  MRSA PCR SCREENING     Status: None   Collection Time    10/21/13  4:08 PM      Result Value Ref Range Status   MRSA by PCR NEGATIVE  NEGATIVE Final   Comment:            The GeneXpert MRSA Assay (FDA     approved for NASAL specimens     only), is one component of a     comprehensive MRSA colonization     surveillance program. It is not     intended to diagnose MRSA     infection nor to guide or     monitor treatment for     MRSA infections.     BRIEF HOSPITAL COURSE:   Active Problems: New onset seizures - Patient had a recent subdural hematoma evacuation which required a craniotomy. Likely new onset seizures related to this. ED physician, spoke with neurosurgery who  recommended initiation of antiepileptics and subsequent discharge from the emergency room. However patient had another seizure in the emergency room and was admitted. Admitting physician, spoke with the on-call neurologist, who recommended to continue with Keppra and have patient followup with Neurology as an outpatient. Since admission, patient doing well no further seizures, no complaints. He is completely awake, alert ,he has ambulated well in the hallway, suspect is stable to be discharged home. Will provide prescription for Keppra, patient has been advised not to drive. Outpatient appointment with [facility] neurology has been made.  Recent subdural hematoma status post craniotomy - CT head does not show any worsening. Stable for outpatient followup with neurosurgery  Cirrhosis of liver - Compensated-continue with usual medications.  History of hepatitis C - Defer further workup in the outpatient setting  TODAY-DAY OF DISCHARGE:  Subjective:   Gary [name] today has no headache,no chest abdominal pain,no new weakness tingling or numbness, feels much better wants to go home today.   Objective:   Blood pressure 123/59, pulse 60, temperature 99.4 F (37.4 C), temperature source Oral, resp. rate 18, height 5\' 10"  (1.778 m), weight 91.899 kg (202 lb 9.6 oz), SpO2 99.00%.  Intake/Output Summary (Last 24 hours) at 10/28/13 1132 Last data filed at 10/28/13 1046  Gross per 24 hour  Intake      0 ml  Output    800 ml  Net   -800 ml   Filed Weights   10/27/13 1659 10/28/13 0435  Weight: 97.977 kg (216 lb) 91.899 kg (202 lb 9.6 oz)    Exam Awake Alert, Oriented *3, No new F.N deficits, Normal affect [place].AT,PERRAL Supple Neck,No JVD, No cervical lymphadenopathy appriciated.  Symmetrical Chest wall movement, Good air movement bilaterally, CTAB RRR,No Gallops,Rubs or new Murmurs, No Parasternal Heave +ve B.Sounds, Abd Soft, Non tender, No organomegaly appriciated, No rebound -guarding or  rigidity. No Cyanosis, Clubbing or edema, No new Rash or bruise  DISCHARGE CONDITION: Stable  DISPOSITION: Home  DISCHARGE INSTRUCTIONS:    Activity:  As tolerated  Diet recommendation: Heart Healthy diet       Discharge Instructions   Call MD for:    Complete by:  As directed   Recurrent seizures     Diet - low sodium heart healthy    Complete by:  As directed      Discharge instructions    Complete by:  As directed   No driving, high-speed sports, participating at activities at heights     Increase activity slowly    Complete by:  As directed            Follow-up Information   Follow up with Rodman Key, FNP. Schedule an appointment as soon as possible for a visit in 1 week.   Specialty:  Nurse Practitioner   Contact information:   [phone] [facility] [address] [facility] [place] [address] [phone]       Follow up with Van Clines, MD On 12/04/2013. (appt at 9:30 am (Neurology follow up post hospital discharge))    Specialty:  Neurology   Contact information:   [address] [place] [address] [phone]      Total Time spent on discharge equals 45 minutes.  SignedJeoffrey Massed 10/28/2013 11:32 AM

## 2013-10-28 NOTE — Discharge Instructions (Signed)
NO DRIVING, NO PARTICIPATION IN HIGH SPEED SPORTS OR ACTIVITIES AT HEIGHTS

## 2013-10-28 NOTE — Progress Notes (Signed)
Pt. Discharged to home. IV intact upon removal. All pt questions and concerns addressed and answered. Pt awake, alert, and oriented upon discharge.

## 2013-10-31 ENCOUNTER — Inpatient Hospital Stay (HOSPITAL_COMMUNITY)
Admission: EM | Admit: 2013-10-31 | Discharge: 2013-11-03 | DRG: 100 | Disposition: A | Discharge status: Home / Self Care | Payer: Medicaid Other | Attending: Internal Medicine | Admitting: Internal Medicine

## 2013-10-31 ENCOUNTER — Encounter (HOSPITAL_COMMUNITY): Payer: Self-pay | Admitting: Emergency Medicine

## 2013-10-31 ENCOUNTER — Inpatient Hospital Stay (HOSPITAL_COMMUNITY): Payer: Medicaid Other

## 2013-10-31 ENCOUNTER — Emergency Department (HOSPITAL_COMMUNITY): Payer: Medicaid Other

## 2013-10-31 DIAGNOSIS — R561 Post traumatic seizures: Secondary | ICD-10-CM

## 2013-10-31 DIAGNOSIS — S065X9A Traumatic subdural hemorrhage with loss of consciousness of unspecified duration, initial encounter: Secondary | ICD-10-CM

## 2013-10-31 DIAGNOSIS — I6200 Nontraumatic subdural hemorrhage, unspecified: Secondary | ICD-10-CM | POA: Diagnosis present

## 2013-10-31 DIAGNOSIS — K7460 Unspecified cirrhosis of liver: Secondary | ICD-10-CM | POA: Diagnosis present

## 2013-10-31 DIAGNOSIS — D649 Anemia, unspecified: Secondary | ICD-10-CM

## 2013-10-31 DIAGNOSIS — R0600 Dyspnea, unspecified: Secondary | ICD-10-CM

## 2013-10-31 DIAGNOSIS — S065XAA Traumatic subdural hemorrhage with loss of consciousness status unknown, initial encounter: Secondary | ICD-10-CM | POA: Diagnosis present

## 2013-10-31 DIAGNOSIS — R569 Unspecified convulsions: Principal | ICD-10-CM

## 2013-10-31 DIAGNOSIS — Z79899 Other long term (current) drug therapy: Secondary | ICD-10-CM

## 2013-10-31 DIAGNOSIS — F1010 Alcohol abuse, uncomplicated: Secondary | ICD-10-CM | POA: Diagnosis present

## 2013-10-31 DIAGNOSIS — D61818 Other pancytopenia: Secondary | ICD-10-CM | POA: Diagnosis present

## 2013-10-31 HISTORY — DX: Unspecified convulsions: R56.9

## 2013-10-31 HISTORY — DX: Traumatic subdural hemorrhage with loss of consciousness of unspecified duration, initial encounter: S06.5X9A

## 2013-10-31 LAB — I-STAT CHEM 8, ED
BUN: 7 mg/dL (ref 6–23)
CHLORIDE: 107 meq/L (ref 96–112)
CREATININE: 0.6 mg/dL (ref 0.50–1.35)
Calcium, Ion: 1.16 mmol/L (ref 1.12–1.23)
GLUCOSE: 144 mg/dL — AB (ref 70–99)
HCT: 39 % (ref 39.0–52.0)
Hemoglobin: 13.3 g/dL (ref 13.0–17.0)
Potassium: 4 mEq/L (ref 3.7–5.3)
Sodium: 141 mEq/L (ref 137–147)
TCO2: 23 mmol/L (ref 0–100)

## 2013-10-31 LAB — DIFFERENTIAL
BASOS ABS: 0 10*3/uL (ref 0.0–0.1)
Basophils Relative: 1 % (ref 0–1)
EOS PCT: 5 % (ref 0–5)
Eosinophils Absolute: 0.2 10*3/uL (ref 0.0–0.7)
LYMPHS PCT: 35 % (ref 12–46)
Lymphs Abs: 1.2 10*3/uL (ref 0.7–4.0)
MONO ABS: 0.6 10*3/uL (ref 0.1–1.0)
MONOS PCT: 16 % — AB (ref 3–12)
NEUTROS ABS: 1.6 10*3/uL — AB (ref 1.7–7.7)
Neutrophils Relative %: 44 % (ref 43–77)

## 2013-10-31 LAB — URINALYSIS, ROUTINE W REFLEX MICROSCOPIC
BILIRUBIN URINE: NEGATIVE
GLUCOSE, UA: NEGATIVE mg/dL
HGB URINE DIPSTICK: NEGATIVE
Ketones, ur: NEGATIVE mg/dL
Leukocytes, UA: NEGATIVE
Nitrite: NEGATIVE
PH: 7.5 (ref 5.0–8.0)
Protein, ur: NEGATIVE mg/dL
SPECIFIC GRAVITY, URINE: 1.005 (ref 1.005–1.030)
UROBILINOGEN UA: 0.2 mg/dL (ref 0.0–1.0)

## 2013-10-31 LAB — RAPID URINE DRUG SCREEN, HOSP PERFORMED
Amphetamines: NOT DETECTED
Barbiturates: NOT DETECTED
Benzodiazepines: NOT DETECTED
COCAINE: NOT DETECTED
OPIATES: NOT DETECTED
Tetrahydrocannabinol: NOT DETECTED

## 2013-10-31 LAB — CBC
HEMATOCRIT: 35 % — AB (ref 39.0–52.0)
HEMOGLOBIN: 11.9 g/dL — AB (ref 13.0–17.0)
MCH: 33.4 pg (ref 26.0–34.0)
MCHC: 34 g/dL (ref 30.0–36.0)
MCV: 98.3 fL (ref 78.0–100.0)
Platelets: 66 10*3/uL — ABNORMAL LOW (ref 150–400)
RBC: 3.56 MIL/uL — ABNORMAL LOW (ref 4.22–5.81)
RDW: 14.6 % (ref 11.5–15.5)
WBC: 3.6 10*3/uL — AB (ref 4.0–10.5)

## 2013-10-31 LAB — COMPREHENSIVE METABOLIC PANEL
ALT: 24 U/L (ref 0–53)
ANION GAP: 12 (ref 5–15)
AST: 52 U/L — AB (ref 0–37)
Albumin: 2.7 g/dL — ABNORMAL LOW (ref 3.5–5.2)
Alkaline Phosphatase: 199 U/L — ABNORMAL HIGH (ref 39–117)
BILIRUBIN TOTAL: 1.6 mg/dL — AB (ref 0.3–1.2)
BUN: 9 mg/dL (ref 6–23)
CALCIUM: 8.5 mg/dL (ref 8.4–10.5)
CHLORIDE: 105 meq/L (ref 96–112)
CO2: 22 meq/L (ref 19–32)
CREATININE: 0.62 mg/dL (ref 0.50–1.35)
Glucose, Bld: 145 mg/dL — ABNORMAL HIGH (ref 70–99)
Potassium: 4.2 mEq/L (ref 3.7–5.3)
Sodium: 139 mEq/L (ref 137–147)
Total Protein: 6.3 g/dL (ref 6.0–8.3)

## 2013-10-31 LAB — PROTIME-INR
INR: 1.46 (ref 0.00–1.49)
Prothrombin Time: 17.9 seconds — ABNORMAL HIGH (ref 11.6–15.2)

## 2013-10-31 LAB — I-STAT TROPONIN, ED: TROPONIN I, POC: 0.01 ng/mL (ref 0.00–0.08)

## 2013-10-31 LAB — CBG MONITORING, ED: GLUCOSE-CAPILLARY: 128 mg/dL — AB (ref 70–99)

## 2013-10-31 LAB — ETHANOL

## 2013-10-31 LAB — APTT: aPTT: 35 seconds (ref 24–37)

## 2013-10-31 LAB — AMMONIA: AMMONIA: 83 umol/L — AB (ref 11–60)

## 2013-10-31 MED ORDER — VITAMIN B-1 100 MG PO TABS
100.0000 mg | ORAL_TABLET | Freq: Every day | ORAL | Status: DC
  Administered 2013-11-02 – 2013-11-03 (×2): 100 mg via ORAL
  Filled 2013-10-31 (×3): qty 1

## 2013-10-31 MED ORDER — ONDANSETRON HCL 4 MG/2ML IJ SOLN: 4.0000 mg | Freq: Four times a day (QID) | INTRAMUSCULAR | Status: DC | PRN

## 2013-10-31 MED ORDER — LORAZEPAM 2 MG/ML IJ SOLN
INTRAMUSCULAR | Status: AC
  Filled 2013-10-31: qty 1

## 2013-10-31 MED ORDER — ADULT MULTIVITAMIN W/MINERALS CH
1.0000 | ORAL_TABLET | Freq: Every day | ORAL | Status: DC
  Administered 2013-11-02 – 2013-11-03 (×2): 1 via ORAL
  Filled 2013-10-31 (×3): qty 1

## 2013-10-31 MED ORDER — LEVETIRACETAM IN NACL 1000 MG/100ML IV SOLN
1000.0000 mg | Freq: Two times a day (BID) | INTRAVENOUS | Status: DC
  Administered 2013-10-31 – 2013-11-01 (×2): 1000 mg via INTRAVENOUS
  Filled 2013-10-31 (×7): qty 100

## 2013-10-31 MED ORDER — THIAMINE HCL 100 MG/ML IJ SOLN
100.0000 mg | Freq: Every day | INTRAMUSCULAR | Status: DC
  Administered 2013-11-01: 100 mg via INTRAVENOUS
  Filled 2013-10-31: qty 1

## 2013-10-31 MED ORDER — FOLIC ACID 1 MG PO TABS
1.0000 mg | ORAL_TABLET | Freq: Every day | ORAL | Status: DC
  Administered 2013-11-02 – 2013-11-03 (×2): 1 mg via ORAL
  Filled 2013-10-31 (×3): qty 1

## 2013-10-31 MED ORDER — PANTOPRAZOLE SODIUM 40 MG PO TBEC
40.0000 mg | DELAYED_RELEASE_TABLET | Freq: Every day | ORAL | Status: DC
  Administered 2013-11-02 – 2013-11-03 (×2): 40 mg via ORAL
  Filled 2013-10-31 (×2): qty 1

## 2013-10-31 MED ORDER — LORAZEPAM 2 MG/ML IJ SOLN
1.0000 mg | Freq: Once | INTRAMUSCULAR | Status: AC
  Administered 2013-10-31: 1 mg via INTRAVENOUS

## 2013-10-31 MED ORDER — RIFAXIMIN 550 MG PO TABS
550.0000 mg | ORAL_TABLET | Freq: Two times a day (BID) | ORAL | Status: DC
  Administered 2013-11-02 – 2013-11-03 (×3): 550 mg via ORAL
  Filled 2013-10-31 (×8): qty 1

## 2013-10-31 MED ORDER — LORAZEPAM 2 MG/ML IJ SOLN
1.0000 mg | Freq: Four times a day (QID) | INTRAMUSCULAR | Status: AC | PRN
  Administered 2013-11-01: 1 mg via INTRAVENOUS
  Filled 2013-10-31: qty 1

## 2013-10-31 MED ORDER — OXYCODONE HCL 5 MG PO TABS
5.0000 mg | ORAL_TABLET | ORAL | Status: DC | PRN
  Administered 2013-11-02: 5 mg via ORAL
  Filled 2013-10-31: qty 1

## 2013-10-31 MED ORDER — LORAZEPAM 2 MG/ML IJ SOLN: 1.0000 mg | Freq: Once | INTRAMUSCULAR | Status: DC

## 2013-10-31 MED ORDER — SODIUM CHLORIDE 0.9 % IV SOLN
INTRAVENOUS | Status: DC
  Administered 2013-10-31: 21:00:00 via INTRAVENOUS

## 2013-10-31 MED ORDER — DICYCLOMINE HCL 10 MG PO CAPS
10.0000 mg | ORAL_CAPSULE | Freq: Three times a day (TID) | ORAL | Status: DC | PRN
  Filled 2013-10-31: qty 1

## 2013-10-31 MED ORDER — ONDANSETRON HCL 4 MG PO TABS: 4.0000 mg | ORAL_TABLET | Freq: Four times a day (QID) | ORAL | Status: DC | PRN

## 2013-10-31 MED ORDER — LORAZEPAM 1 MG PO TABS
1.0000 mg | ORAL_TABLET | Freq: Four times a day (QID) | ORAL | Status: AC | PRN
  Administered 2013-11-01: 1 mg via ORAL
  Filled 2013-10-31: qty 1

## 2013-10-31 MED ORDER — LACTULOSE 10 GM/15ML PO SOLN
20.0000 g | Freq: Every day | ORAL | Status: DC
  Administered 2013-11-02 – 2013-11-03 (×2): 20 g via ORAL
  Filled 2013-10-31 (×4): qty 30

## 2013-10-31 MED ORDER — PROPRANOLOL HCL 20 MG PO TABS
20.0000 mg | ORAL_TABLET | Freq: Two times a day (BID) | ORAL | Status: DC
  Administered 2013-11-02 (×2): 20 mg via ORAL
  Filled 2013-10-31 (×8): qty 1

## 2013-10-31 MED ORDER — LEVETIRACETAM IN NACL 1000 MG/100ML IV SOLN
1000.0000 mg | INTRAVENOUS | Status: AC
  Administered 2013-10-31: 1000 mg via INTRAVENOUS
  Filled 2013-10-31: qty 100

## 2013-10-31 NOTE — ED Notes (Signed)
EEG Tech at bedside. 

## 2013-10-31 NOTE — Consult Note (Signed)
Referring Physician: Silverio Lay    Chief Complaint: AMS possible seizure  HPI:                                                                                                                                         Gary [name] is an 53 y.o. male who recently was in the hospital for evacuation of subdural hematoma on 10-21-2013.  Patient had witnessed seizure while in hospital and was placed on Keppra 500 mg BID.  Today patient had another siezure which was witnessed by family.  Patient may or may not have returned to baseline after this seizure.  At 11:15 CNA noted he was acting confused and called EMS.  On arrival her had noted left facial droop (old) and was not following commands.  He was purposefully pulling up the sheets on the bed and moving all extremity. Initial CT head was negative for further ICH.  Currently patient appears post ictal and confused, has actively urinated on himself and not following commands.   Date last known well: Date: 10/31/2013 Time last known well: Unable to determine tPA Given: No: Recent surgery  Past Medical History  Diagnosis Date  . Hepatitis   . Cirrhosis   . SDH (subdural hematoma)   . Seizure     Past Surgical History  Procedure Laterality Date  . Craniotomy Right 10/21/2013    Procedure: CRANIOTOMY HEMATOMA EVACUATION SUBDURAL;  Surgeon: Maeola Harman, MD;  Location: [facility];  Service: Neurosurgery;  Laterality: Right;    Family History  Problem Relation Age of Onset  . Cirrhosis Neg Hx   . Seizures Neg Hx    Social History:  reports that he has never smoked. He does not have any smokeless tobacco history on file. He reports that he drinks alcohol. He reports that he uses illicit drugs (Cocaine).  Allergies: No Known Allergies  Medications:                                                                                                                           Current Facility-Administered Medications  Medication Dose Route Frequency  Provider Last Rate Last Dose  . levETIRAcetam (KEPPRA) IVPB 1000 mg/100 mL premix  1,000 mg Intravenous STAT Ulice Dash, PA-C   1,000 mg at 10/31/13 1306  . LORazepam (ATIVAN) 2 MG/ML injection  Current Outpatient Prescriptions  Medication Sig Dispense Refill  . acetaminophen (TYLENOL) 500 MG tablet Take 1,000 mg by mouth every 6 (six) hours as needed for moderate pain.      Marland Kitchen. dexlansoprazole (DEXILANT) 60 MG capsule Take 60 mg by mouth daily.      Marland Kitchen. dicyclomine (BENTYL) 10 MG capsule Take 10 mg by mouth 3 (three) times daily as needed for spasms.       . furosemide (LASIX) 40 MG tablet Take 80 mg by mouth daily.      Marland Kitchen. lactulose (CHRONULAC) 10 GM/15ML solution Take 20 g by mouth daily.       Marland Kitchen. levETIRAcetam (KEPPRA) 500 MG tablet Take 1 tablet (500 mg total) by mouth 2 (two) times daily.  120 tablet  0  . propranolol (INDERAL) 20 MG tablet Take 20 mg by mouth 2 (two) times daily.      . rifaximin (XIFAXAN) 550 MG TABS tablet Take 550 mg by mouth 2 (two) times daily.      . sertraline (ZOLOFT) 100 MG tablet Take 100 mg by mouth at bedtime.       Marland Kitchen. spironolactone (ALDACTONE) 25 MG tablet Take 25 mg by mouth 2 (two) times daily.         ROS:                                                                                                                                       History obtained from unobtainable from patient due to mental status   Neurologic Examination:                                                                                                      Blood pressure 150/88, temperature 100.1 F (37.8 C), temperature source Oral, resp. rate 13, SpO2 91.00%.  General: NAD Mental Status: Alert, not oriented, does not follow commands, moving all extremities purposefully and looks toward voice calling his name.  Able to follow 3 step commands without difficulty. He does not extinguish rto DSS and is able to count fingers in both fields simultaneously.  Cranial  Nerves: II: Discs difficult to visualize due to not following commands. Blinks to threat bilaterally, pupils equal, round, reactive to light and accommodation III,IV, VI: ptosis  Present on the left, extra-ocular motions intact bilaterally V,VII: smile asymmetric with left facial droop,winced to pain bilaterally VIII: hearing normal bilaterally IX,X: gag reflex present XI: bilateral shoulder shrug XII: midline tongue extension without atrophy or fasciculations  Motor: Moving all extremities bilaterally antigravity Sensory: Pinprick and light touch intact throughout, bilaterally Deep Tendon Reflexes:  Right: Upper Extremity   Left: Upper extremity   biceps (C-5 to C-6) 2/4   biceps (C-5 to C-6) 2/4 tricep (C7) 2/4    triceps (C7) 2/4 Brachioradialis (C6) 2/4  Brachioradialis (C6) 2/4  Lower Extremity Lower Extremity  quadriceps (L-2 to L-4) 2/4   quadriceps (L-2 to L-4) 3/4 Achilles (S1) 2/4   Achilles (S1) 2/4  Plantars: Right: downgoing   Left: downgoing Cerebellar: Unable to assess due to confusion  Gait: unable to assess due to confusion CV: pulses palpable throughout    Lab Results: Basic Metabolic Panel:  Recent Labs Lab 10/27/13 1748 10/28/13 0420 10/31/13 1235  NA 138 141 141  K 4.4 4.1 4.0  CL 100 106 107  CO2 26 25  --   GLUCOSE 86 113* 144*  BUN 14 12 7   CREATININE 0.73 0.67 0.60  CALCIUM 9.1 8.6  --     Liver Function Tests:  Recent Labs Lab 10/27/13 1748 10/28/13 0420  AST 45* 38*  ALT 20 17  ALKPHOS 116 97  BILITOT 2.3* 2.0*  PROT 6.7 5.6*  ALBUMIN 3.1* 2.5*   No results found for this basename: LIPASE, AMYLASE,  in the last 168 hours No results found for this basename: AMMONIA,  in the last 168 hours  CBC:  Recent Labs Lab 10/27/13 1717 10/28/13 0420 10/31/13 1224 10/31/13 1235  WBC 4.8 4.5 3.6*  --   NEUTROABS  --  2.0 1.6*  --   HGB 12.3* 11.1* 11.9* 13.3  HCT 35.3* 32.5* 35.0* 39.0  MCV 97.0 97.3 98.3  --   PLT 69* 63* 66*   --     Cardiac Enzymes: No results found for this basename: CKTOTAL, CKMB, CKMBINDEX, TROPONINI,  in the last 168 hours  Lipid Panel: No results found for this basename: CHOL, TRIG, HDL, CHOLHDL, VLDL, LDLCALC,  in the last 168 hours  CBG:  Recent Labs Lab 10/27/13 1745  GLUCAP 84    Microbiology: Results for orders placed during the hospital encounter of 10/21/13  MRSA PCR SCREENING     Status: None   Collection Time    10/21/13  4:08 PM      Result Value Ref Range Status   MRSA by PCR NEGATIVE  NEGATIVE Final   Comment:            The GeneXpert MRSA Assay (FDA     approved for NASAL specimens     only), is one component of a     comprehensive MRSA colonization     surveillance program. It is not     intended to diagnose MRSA     infection nor to guide or     monitor treatment for     MRSA infections.    Coagulation Studies:  Recent Labs  10/31/13 1224  LABPROT 17.9*  INR 1.46    Imaging: Ct Head Wo Contrast  10/31/2013   CLINICAL DATA:  Code stroke.  Subdural hemorrhage.  EXAM: CT HEAD WITHOUT CONTRAST  TECHNIQUE: Contiguous axial images were obtained from the base of the skull through the vertex without intravenous contrast.  COMPARISON:  10/27/2013  FINDINGS: Recent craniotomy for subdural hematoma drainage. Large subdural fluid collection on the right is similar in size. This is slightly lower density than on the prior study compatible with subacute blood. No acute hemorrhage has occurred since the prior study. The fluid collection  measures approximately 22 mm in thickness  7 mm midline shift to the left is similar. There is increased effacement of the sulci on the right due to mass effect. This is slightly more prominent.  Negative for acute infarct.  Negative for mass or edema.  IMPRESSION: Large subdural fluid collection on the right shows decreased attenuation compared with 4 days ago compatible with resolving blood products. No new hemorrhage since the prior  study  7 mm midline shift to the left. Effacement of the sulci on the right has progressed due to mass effect from the subdural fluid collection.  Critical Value/emergent results were called by telephone at the time of interpretation on 10/31/2013 at 12:42 pm to Dr. Amada JupiterKirkpatrick, who verbally acknowledged these results.   Electronically Signed   By: Marlan Palauharles  Clark M.D.   On: 10/31/2013 12:42       Assessment and plan discussed with with attending physician and they are in agreement.    Felicie MornDavid Smith PA-C [facility] [phone]  10/31/2013, 1:16 PM  I have seen and evaluated the patient. I have reviewed the above note and made appropriate changes.     Assessment: 53 y.o. male  With seizure and post-ictal confusion. He does have some increased mass by radiology's read of his subdural fluid, but no new hemorrhage. I Would favor increasing his keppra following load.   Stroke Risk Factors - none  1) Keppra 1gm x 1 2) Increase home dose to 1gm BID 3) disposition pending improvement at this time.   Ritta SlotMcNeill Breslin Hemann, MD [facility] [phone]  If 7pm- 7am, please page neurology on call as listed in [facility].

## 2013-10-31 NOTE — ED Provider Notes (Signed)
I saw and evaluated the patient, reviewed the resident's note and I agree with the findings and plan.   EKG Interpretation None      Gary [name] is a 53 y.o. male hx of cirrhosis, recent subdural hematoma s/p craniotomy a week ago here with code stroke, possible seizure. Wife noticed that he had tonic clonic jerks on L side with L facial droop. Came in a as code stroke. Had seizure 2 days ago and is currently on keppra. On my exam, he is confused. Has mild L facial droop. No obvious seizure activity and is movin all extremities. Dr. Amada JupiterKirkpatrick from neuro felt its Todd's paralysis vs worsening subdural. I consulted Dr. Venetia MaxonStern from neurosurgery, who reviewed the CT, and states that its expected postop changes instead of worsening subdural (as read by radiology). Will admit to stepdown for close monitoring.    Richardean Canalavid H Yao, MD 10/31/13 [phone]

## 2013-10-31 NOTE — ED Provider Notes (Signed)
CSN: [id number]     Arrival date & time 10/31/13  1220 History   First MD Initiated Contact with Patient 10/31/13 1239     Chief Complaint  Patient presents with  . Code Stroke     (Consider location/radiation/quality/duration/timing/severity/associated sxs/prior Treatment) Patient is a 53 y.o. male presenting with seizures.  Seizures Seizure activity on arrival: no   Seizure type:  Grand mal Preceding symptoms: no sensation of an aura present   Initial focality:  Diffuse Episode characteristics: generalized shaking   Postictal symptoms: confusion and somnolence   Return to baseline: no   Severity:  Severe Duration:  5 minutes Timing:  Once Number of seizures this episode:  1 Progression:  Worsening Context: previous head injury   Recent head injury: 1 week ago. PTA treatment:  None History of seizures: no     Past Medical History  Diagnosis Date  . Hepatitis   . Cirrhosis   . SDH (subdural hematoma)   . Seizure    Past Surgical History  Procedure Laterality Date  . Craniotomy Right 10/21/2013    Procedure: CRANIOTOMY HEMATOMA EVACUATION SUBDURAL;  Surgeon: Maeola HarmanJoseph Stern, MD;  Location: [facility];  Service: Neurosurgery;  Laterality: Right;   Family History  Problem Relation Age of Onset  . Cirrhosis Neg Hx   . Seizures Neg Hx    History  Substance Use Topics  . Smoking status: Never Smoker   . Smokeless tobacco: Not on file  . Alcohol Use: Yes    Review of Systems  Unable to perform ROS: Acuity of condition  Neurological: Positive for seizures.      Allergies  Review of patient's allergies indicates no known allergies.  Home Medications   Prior to Admission medications   Medication Sig Start Date End Date Taking? Authorizing Provider  acetaminophen (TYLENOL) 500 MG tablet Take 1,000 mg by mouth every 6 (six) hours as needed for moderate pain.   Yes Historical Provider, MD  dexlansoprazole (DEXILANT) 60 MG capsule Take 60 mg by mouth daily.   Yes  Historical Provider, MD  dicyclomine (BENTYL) 10 MG capsule Take 10 mg by mouth 3 (three) times daily as needed for spasms.    Yes Historical Provider, MD  furosemide (LASIX) 40 MG tablet Take 80 mg by mouth daily. 02/20/13  Yes Historical Provider, MD  lactulose (CHRONULAC) 10 GM/15ML solution Take 20 g by mouth daily.    Yes Historical Provider, MD  levETIRAcetam (KEPPRA) 500 MG tablet Take 1 tablet (500 mg total) by mouth 2 (two) times daily. 10/28/13  Yes Shanker Levora DredgeM Ghimire, MD  propranolol (INDERAL) 20 MG tablet Take 20 mg by mouth 2 (two) times daily.   Yes Historical Provider, MD  rifaximin (XIFAXAN) 550 MG TABS tablet Take 550 mg by mouth 2 (two) times daily.   Yes Historical Provider, MD  sertraline (ZOLOFT) 100 MG tablet Take 100 mg by mouth at bedtime.    Yes Historical Provider, MD  spironolactone (ALDACTONE) 25 MG tablet Take 25 mg by mouth 2 (two) times daily.   Yes Historical Provider, MD   BP 131/76  Pulse 74  Temp(Src) 100.1 F (37.8 C) (Oral)  Resp 14  SpO2 95% Physical Exam  Constitutional: He appears well-developed and well-nourished.  HENT:  Head: Normocephalic and atraumatic.  Eyes: EOM are normal.  Neck: Normal range of motion.  Cardiovascular: Normal rate, regular rhythm and normal heart sounds.   No murmur heard. Pulmonary/Chest: Effort normal and breath sounds normal. No respiratory distress.  Abdominal: Soft. There is no tenderness.  Musculoskeletal: He exhibits no edema.  Neurological: He is alert. He has normal strength. No cranial nerve deficit or sensory deficit. GCS eye subscore is 4. GCS verbal subscore is 3. GCS motor subscore is 6.  Skin: No rash noted. He is not diaphoretic.    ED Course  Procedures (including critical care time) Labs Review Labs Reviewed  PROTIME-INR - Abnormal; Notable for the following:    Prothrombin Time 17.9 (*)    All other components within normal limits  CBC - Abnormal; Notable for the following:    WBC 3.6 (*)    RBC  3.56 (*)    Hemoglobin 11.9 (*)    HCT 35.0 (*)    Platelets 66 (*)    All other components within normal limits  DIFFERENTIAL - Abnormal; Notable for the following:    Neutro Abs 1.6 (*)    Monocytes Relative 16 (*)    All other components within normal limits  COMPREHENSIVE METABOLIC PANEL - Abnormal; Notable for the following:    Glucose, Bld 145 (*)    Albumin 2.7 (*)    AST 52 (*)    Alkaline Phosphatase 199 (*)    Total Bilirubin 1.6 (*)    All other components within normal limits  I-STAT CHEM 8, ED - Abnormal; Notable for the following:    Glucose, Bld 144 (*)    All other components within normal limits  CBG MONITORING, ED - Abnormal; Notable for the following:    Glucose-Capillary 128 (*)    All other components within normal limits  ETHANOL  APTT  URINE RAPID DRUG SCREEN (HOSP PERFORMED)  URINALYSIS, ROUTINE W REFLEX MICROSCOPIC  I-STAT TROPOININ, ED  I-STAT TROPOININ, ED    Imaging Review Ct Head Wo Contrast  10/31/2013   CLINICAL DATA:  Code stroke.  Subdural hemorrhage.  EXAM: CT HEAD WITHOUT CONTRAST  TECHNIQUE: Contiguous axial images were obtained from the base of the skull through the vertex without intravenous contrast.  COMPARISON:  10/27/2013  FINDINGS: Recent craniotomy for subdural hematoma drainage. Large subdural fluid collection on the right is similar in size. This is slightly lower density than on the prior study compatible with subacute blood. No acute hemorrhage has occurred since the prior study. The fluid collection measures approximately 22 mm in thickness  7 mm midline shift to the left is similar. There is increased effacement of the sulci on the right due to mass effect. This is slightly more prominent.  Negative for acute infarct.  Negative for mass or edema.  IMPRESSION: Large subdural fluid collection on the right shows decreased attenuation compared with 4 days ago compatible with resolving blood products. No new hemorrhage since the prior  study  7 mm midline shift to the left. Effacement of the sulci on the right has progressed due to mass effect from the subdural fluid collection.  Critical Value/emergent results were called by telephone at the time of interpretation on 10/31/2013 at 12:42 pm to Dr. Amada Jupiter, who verbally acknowledged these results.   Electronically Signed   By: Marlan Palau M.D.   On: 10/31/2013 12:42     EKG Interpretation None      MDM   Final diagnoses:  Subdural hematoma  Seizure after head injury    Patient is a 53 year old male that presents with seizures and strokelike symptoms. Patient recently had a subdural hematoma that was evacuated by neurosurgery felt 10 days ago after a fall. 2 days ago the  patient had his first seizure and had his head CT that showed a stable scan. Patient then cease 1 ED and he was admitted. Patient was discharged yesterday and had been started on Keppra which the patient has been taking. Today the patient had a seizure for approximately 5 minutes with postictal for about one hour and then the family started noticing left-sided facial droop and left-sided weakness. EMS was called and a code stroke was called and upon arrival to the ED the patient's symptoms had resolved so is unclear if this is a TIA versus Todd's paralysis. CT was performed which showed increasing subdural hematoma with worsening midline shift. Neuro surgery was consulted.  Patient's neuro exam has returned to baseline however the patient still confused on some answers. Patient will be admitted to the hospital for further evaluation and treatment.    Beverely Risenennis Valora Norell, MD 10/31/13 [phone]

## 2013-10-31 NOTE — H&P (Signed)
[facility] History and Physical  Norris CrossRandall S Vieth ZOX:[id number] DOB: 11/29/1960 DOA: 10/31/2013  Referring physician: Dr Silverio LayYao.  PCP: Rodman KeyHarrison, Dawn S, FNP   Chief Complaint:   HPI: Norris CrossRandall S Terpening is a 53 y.o. male with PMH signigicant for subdural hematoma S/P 10-07  , recently discharge from hospital 10-13 at that time he was diagnosed with seizure, he was discharge on keppra. Patient was found confuse, breathing hard by family. Patient was also notice to have left facial droop and not following command and left side hand weakness. He was evaluated by neurologist who think patient had seizure episode. Patient received ativan and keppra in the ED.  Patient is lethargic but wake up to answer questions, follow some command.  Per family patient continue to drink alcohol.    Ct head showed; Large subdural fluid collection on the right shows decreased  attenuation compared with 4 days ago compatible with resolving blood  products. No new hemorrhage since the prior study 7 mm midline shift to the left. Effacement of the sulci on the right has progressed due to mass effect from the subdural fluid collection.    Review of Systems:  Unable to obtain.   Past Medical History  Diagnosis Date  . Hepatitis   . Cirrhosis   . SDH (subdural hematoma)   . Seizure    Past Surgical History  Procedure Laterality Date  . Craniotomy Right 10/21/2013    Procedure: CRANIOTOMY HEMATOMA EVACUATION SUBDURAL;  Surgeon: Maeola HarmanJoseph Stern, MD;  Location: [facility];  Service: Neurosurgery;  Laterality: Right;   Social History:  reports that he has never smoked. He does not have any smokeless tobacco history on file. He reports that he drinks alcohol. He reports that he uses illicit drugs (Cocaine).  No Known Allergies  Family History  Problem Relation Age of Onset  . Cirrhosis Neg Hx   . Seizures Neg Hx      Prior to Admission medications   Medication Sig Start Date End Date Taking? Authorizing  Provider  acetaminophen (TYLENOL) 500 MG tablet Take 1,000 mg by mouth every 6 (six) hours as needed for moderate pain.   Yes Historical Provider, MD  dexlansoprazole (DEXILANT) 60 MG capsule Take 60 mg by mouth daily.   Yes Historical Provider, MD  dicyclomine (BENTYL) 10 MG capsule Take 10 mg by mouth 3 (three) times daily as needed for spasms.    Yes Historical Provider, MD  furosemide (LASIX) 40 MG tablet Take 80 mg by mouth daily. 02/20/13  Yes Historical Provider, MD  lactulose (CHRONULAC) 10 GM/15ML solution Take 20 g by mouth daily.    Yes Historical Provider, MD  levETIRAcetam (KEPPRA) 500 MG tablet Take 1 tablet (500 mg total) by mouth 2 (two) times daily. 10/28/13  Yes Shanker Levora DredgeM Ghimire, MD  propranolol (INDERAL) 20 MG tablet Take 20 mg by mouth 2 (two) times daily.   Yes Historical Provider, MD  rifaximin (XIFAXAN) 550 MG TABS tablet Take 550 mg by mouth 2 (two) times daily.   Yes Historical Provider, MD  sertraline (ZOLOFT) 100 MG tablet Take 100 mg by mouth at bedtime.    Yes Historical Provider, MD  spironolactone (ALDACTONE) 25 MG tablet Take 25 mg by mouth 2 (two) times daily.   Yes Historical Provider, MD   Physical Exam: Filed Vitals:   10/31/13 1315 10/31/13 1345 10/31/13 1415 10/31/13 1451  BP: 136/71 133/75 131/64 131/76  Pulse: 80 75 74   Temp:      TempSrc:  Resp: 23 18 17 14   SpO2: 93% 96% 97% 95%    Wt Readings from Last 3 Encounters:  10/28/13 91.899 kg (202 lb 9.6 oz)  10/24/13 98.4 kg (216 lb 14.9 oz)  10/24/13 98.4 kg (216 lb 14.9 oz)    General:  Appears calm and comfortable, wake up to answer questions.  Eyes: PERRL, normal lids, irises & conjunctiva ENT: grossly normal hearing, lips & tongue Neck: no LAD, masses or thyromegaly Cardiovascular: RRR, no m/r/g. No LE edema. Telemetry: SR, no arrhythmias  Respiratory: CTA bilaterally, no w/r/r. Normal respiratory effort. Abdomen: soft, ntnd Skin: no rash or induration seen on limited  exam Musculoskeletal: grossly normal tone BUE/BLE Neurologic: lethargic but wake up to answer questions, confuse, moving extremities passively.            Labs on Admission:  Basic Metabolic Panel:  Recent Labs Lab 10/27/13 1748 10/28/13 0420 10/31/13 1224 10/31/13 1235  NA 138 141 139 141  K 4.4 4.1 4.2 4.0  CL 100 106 105 107  CO2 26 25 22   --   GLUCOSE 86 113* 145* 144*  BUN 14 12 9 7   CREATININE 0.73 0.67 0.62 0.60  CALCIUM 9.1 8.6 8.5  --    Liver Function Tests:  Recent Labs Lab 10/27/13 1748 10/28/13 0420 10/31/13 1224  AST 45* 38* 52*  ALT 20 17 24   ALKPHOS 116 97 199*  BILITOT 2.3* 2.0* 1.6*  PROT 6.7 5.6* 6.3  ALBUMIN 3.1* 2.5* 2.7*   No results found for this basename: LIPASE, AMYLASE,  in the last 168 hours No results found for this basename: AMMONIA,  in the last 168 hours CBC:  Recent Labs Lab 10/27/13 1717 10/28/13 0420 10/31/13 1224 10/31/13 1235  WBC 4.8 4.5 3.6*  --   NEUTROABS  --  2.0 1.6*  --   HGB 12.3* 11.1* 11.9* 13.3  HCT 35.3* 32.5* 35.0* 39.0  MCV 97.0 97.3 98.3  --   PLT 69* 63* 66*  --    Cardiac Enzymes: No results found for this basename: CKTOTAL, CKMB, CKMBINDEX, TROPONINI,  in the last 168 hours  BNP (last 3 results) No results found for this basename: PROBNP,  in the last 8760 hours CBG:  Recent Labs Lab 10/27/13 1745 10/31/13 1237  GLUCAP 84 128*    Radiological Exams on Admission: Ct Head Wo Contrast  10/31/2013   CLINICAL DATA:  Code stroke.  Subdural hemorrhage.  EXAM: CT HEAD WITHOUT CONTRAST  TECHNIQUE: Contiguous axial images were obtained from the base of the skull through the vertex without intravenous contrast.  COMPARISON:  10/27/2013  FINDINGS: Recent craniotomy for subdural hematoma drainage. Large subdural fluid collection on the right is similar in size. This is slightly lower density than on the prior study compatible with subacute blood. No acute hemorrhage has occurred since the prior study.  The fluid collection measures approximately 22 mm in thickness  7 mm midline shift to the left is similar. There is increased effacement of the sulci on the right due to mass effect. This is slightly more prominent.  Negative for acute infarct.  Negative for mass or edema.  IMPRESSION: Large subdural fluid collection on the right shows decreased attenuation compared with 4 days ago compatible with resolving blood products. No new hemorrhage since the prior study  7 mm midline shift to the left. Effacement of the sulci on the right has progressed due to mass effect from the subdural fluid collection.  Critical Value/emergent results were called  by telephone at the time of interpretation on 10/31/2013 at 12:42 pm to Dr. Amada Jupiter, who verbally acknowledged these results.   Electronically Signed   By: Marlan Palau M.D.   On: 10/31/2013 12:42    EKG: Independently reviewed. Sinus rhythm.   Assessment/Plan Active Problems:   Subdural hematoma  1-Seizure: Received keppra load. Neuro recommend 1 g BID. EEG ordered. Neurology following. Check chest x ray to evaluate for aspiration.   2-Subdural hematoma, Ct with 7 mm midline shift. Neurosurgery consulted. Dr Venetia Maxon will see patient in consultation.   3-Pancytopenia; in setting of cirrhosis and alcohol use. Monitor.   4-Alcohol use; he will need counseling. SW consulted. Monitor on CIWA protocol. Per family maybe last drink was 2 days ago. Start Thiamine and folate.   5-Cirrhosis; hold lasix , spirolactone while NPO.   Code Status:  DVT Prophylaxis: SCD no anticoagulation due to risk for bleeding.  Family Communication: Care discussed with multiple family member at bedside.  Disposition Plan: expect 3 to 4 days inpatient.   Time spent: 75 minutes.   Hartley Barefoot A [facility] Pager [phone]

## 2013-10-31 NOTE — ED Notes (Addendum)
[facility]- Pt had surgery Tuesday for subdural hematoma, today began having left side facial droop and some left side upper extremity weakness. Pt can answer some questions appropriately, pt also had a seizure at approximately 1000. He was last seen well at 1115. Pt alert and oriented to self, place, and situation.

## 2013-10-31 NOTE — Code Documentation (Signed)
53[facility].  Patient with a h/o alcoholic cirrhosis s/p SDH with craniotomy on 10/22/2013.  Patient was discharged from the hospital on 10/25/2013.  Patient had a witnessed seizure this morning per family, but was back to his baseline at 1100 per EMS.  Nursing assistant came to his home at 1115 and found him to have altered mental status.  EMS notified and activated Code Stroke for left facial droop and left sided neglect.  Patient incontinent and constantly reaching for things and not following commands.  Patient taken to CT on arrival.  Stroke Team at bedside.  NIHSS 6, see documentation for details and code stroke times.  Patient with continued altered behavior and facial tremors.  Dr. Amada JupiterKirkpatrick at the bedside, order for Ativan IVP and Keppra IV.  Patient is contraindicated for treatment with tPA d/t recent SDH and craniotomy.  Bedside handoff with ED RN Nehemiah SettleBrooke.

## 2013-10-31 NOTE — Procedures (Signed)
History: 53 yo M with recent SDH and seizure  Sedation: recent ativan 1mg   Technique: This is a 17 channel routine scalp EEG performed at the bedside with bipolar and monopolar montages arranged in accordance to the international 10/20 system of electrode placement. One channel was dedicated to EKG recording.    Background: There is a well defined posterior dominant rhythm of 11 Hz that is seen better on the left than right. The background consists of intermixed alpha and beta activities with faster frequencies seen more on the left than right.   Photic stimulation: Physiologic driving is not perfomred  EEG Abnormalities: 1) Asymmetric PDR  Clinical Interpretation: The asymmetry seen on this EEG is consistent with the patient's known subdural fluid collection. There was no seizure or seizure predisposition recorded on this study. Please note that a normal eeg does not exclude the possibility of epilepsy.   Ritta SlotMcNeill Raphael Fitzpatrick, MD [facility] [phone]  If 7pm- 7am, please page neurology on call as listed in AMION.

## 2013-10-31 NOTE — ED Notes (Signed)
Pt urinated in the floor, I instructed pt to urinate in the urinal. Pt gown, sheet, and bed pad changed.

## 2013-10-31 NOTE — Progress Notes (Signed)
EEG completed, results pending. 

## 2013-11-01 DIAGNOSIS — K7460 Unspecified cirrhosis of liver: Secondary | ICD-10-CM

## 2013-11-01 DIAGNOSIS — I6200 Nontraumatic subdural hemorrhage, unspecified: Secondary | ICD-10-CM

## 2013-11-01 LAB — COMPREHENSIVE METABOLIC PANEL
ALBUMIN: 2.4 g/dL — AB (ref 3.5–5.2)
ALT: 21 U/L (ref 0–53)
AST: 41 U/L — AB (ref 0–37)
Alkaline Phosphatase: 116 U/L (ref 39–117)
Anion gap: 11 (ref 5–15)
BUN: 7 mg/dL (ref 6–23)
CALCIUM: 8.6 mg/dL (ref 8.4–10.5)
CO2: 24 mEq/L (ref 19–32)
CREATININE: 0.7 mg/dL (ref 0.50–1.35)
Chloride: 104 mEq/L (ref 96–112)
GFR calc Af Amer: >90 mL/min (ref >90)
Glucose, Bld: 84 mg/dL (ref 70–99)
Potassium: 4 mEq/L (ref 3.7–5.3)
Sodium: 139 mEq/L (ref 137–147)
TOTAL PROTEIN: 5.5 g/dL — AB (ref 6.0–8.3)
Total Bilirubin: 2.5 mg/dL — ABNORMAL HIGH (ref 0.3–1.2)

## 2013-11-01 LAB — PROTIME-INR
INR: 1.61 — ABNORMAL HIGH (ref 0.00–1.49)
Prothrombin Time: 19.3 seconds — ABNORMAL HIGH (ref 11.6–15.2)

## 2013-11-01 LAB — CBC
HEMATOCRIT: 33.9 % — AB (ref 39.0–52.0)
HEMOGLOBIN: 11.5 g/dL — AB (ref 13.0–17.0)
MCH: 33 pg (ref 26.0–34.0)
MCHC: 33.9 g/dL (ref 30.0–36.0)
MCV: 97.1 fL (ref 78.0–100.0)
PLATELETS: DECREASED 10*3/uL (ref 150–400)
RBC: 3.49 MIL/uL — AB (ref 4.22–5.81)
RDW: 14.3 % (ref 11.5–15.5)
WBC: 4.1 10*3/uL (ref 4.0–10.5)

## 2013-11-01 MED ORDER — SPIRONOLACTONE 25 MG PO TABS
25.0000 mg | ORAL_TABLET | Freq: Two times a day (BID) | ORAL | Status: DC
  Administered 2013-11-01 – 2013-11-03 (×5): 25 mg via ORAL
  Filled 2013-11-01 (×7): qty 1

## 2013-11-01 MED ORDER — FUROSEMIDE 40 MG PO TABS
40.0000 mg | ORAL_TABLET | Freq: Every day | ORAL | Status: DC
  Administered 2013-11-01 – 2013-11-03 (×3): 40 mg via ORAL
  Filled 2013-11-01 (×3): qty 1

## 2013-11-01 NOTE — Progress Notes (Signed)
Pt transferred to 4N11.  

## 2013-11-01 NOTE — Progress Notes (Signed)
Patient received to 4N11 in WC. Patient able to answer questions but speech inappropriate at times. "I'm blind. You have to leave the bathroom door open so I can see. 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 Oriented patient to room and safety plan. Patient verbalizes understanding but patient is not retaining new information at this time.

## 2013-11-01 NOTE — Progress Notes (Signed)
Looks good. No HA No Sz.  A/A Ox4 Motor 5/5  Doing well. Ok for [employer] from my standpoint f/u with Dr Venetia MaxonStern

## 2013-11-01 NOTE — Progress Notes (Signed)
[facility] PROGRESS NOTE   Gary [name]:[id number]:[id number] DOB: 05/31/1960 DOA: 10/31/2013 PCP: Rodman KeyHarrison, [name], FNP  HPI/Subjective: Feels okay, denies any seizure-like activity  Assessment/Plan: Active Problems:   Subdural hematoma   Cirrhosis   Seizure   1-Seizure:  Received keppra load. Neuro recommend 1 g BID. EEG ordered. Neurology following.  No evidence of aspiration, no symptoms no leukocytosis.  2-Subdural hematoma Ct with 7 mm midline shift. Neurosurgery consulted.  Evaluated earlier today by Gary [name], recommended no other intervention.  3-Pancytopenia; in setting of cirrhosis and alcohol use. Monitor.   4-Alcohol use;  he will need counseling. SW consulted. Monitor on CIWA protocol.  Per family maybe last drink was 2 days ago. Start Thiamine and folate.   5-Cirrhosis; restart Lasix, spironolactone and heart healthy diet  Code Status: Full code Family Communication: Plan discussed with the patient. Disposition Plan: Remains inpatient, likely to be discharged in the morning.   Consultants:  Neurosurgery.  Neurology  Procedures:  EEG: The asymmetry seen on this EEG is consistent with the patient's known subdural fluid collection. There was no seizure or seizure predisposition recorded on this study. Please note that [name] normal eeg does not exclude the possibility of epilepsy.   Antibiotics:  None   Objective: Filed Vitals:   11/01/13 0800  BP: 130/74  Pulse: 51  Temp: 98.3 F (36.8 C)  Resp: 12    Intake/Output Summary (Last 24 hours) at 11/01/13 1148 Last data filed at 11/01/13 1100  Gross per 24 hour  Intake  892.5 ml  Output   2125 ml  Net -1232.5 ml   Filed Weights   10/31/13 1832  Weight: 92.8 kg (204 lb 9.4 oz)    Exam: General: Alert and awake, oriented x3, not in any acute distress. HEENT: anicteric sclera, pupils reactive to light and accommodation, EOMI CVS: S1-S2 clear, no murmur rubs or gallops Chest: clear to  auscultation bilaterally, no wheezing, rales or rhonchi Abdomen: soft nontender, nondistended, normal bowel sounds, no organomegaly Extremities: no cyanosis, clubbing or edema noted bilaterally Neuro: Cranial nerves II-XII intact, no focal neurological deficits  Data Reviewed: Basic Metabolic Panel:  Recent Labs Lab 10/27/13 1748 10/28/13 0420 10/31/13 1224 10/31/13 1235 11/01/13 0242  NA 138 141 139 141 139  K 4.4 4.1 4.2 4.0 4.0  CL 100 106 105 107 104  CO2 26 25 22   --  24  GLUCOSE 86 113* 145* 144* 84  BUN 14 12 9 7 7   CREATININE 0.73 0.67 0.62 0.60 0.70  CALCIUM 9.1 8.6 8.5  --  8.6   Liver Function Tests:  Recent Labs Lab 10/27/13 1748 10/28/13 0420 10/31/13 1224 11/01/13 0242  AST 45* 38* 52* 41*  ALT 20 17 24 21   ALKPHOS 116 97 199* 116  BILITOT 2.3* 2.0* 1.6* 2.5*  PROT 6.7 5.6* 6.3 5.5*  ALBUMIN 3.1* 2.5* 2.7* 2.4*   No results found for this basename: LIPASE, AMYLASE,  in the last 168 hours  Recent Labs Lab 10/31/13 1756  AMMONIA 83*   CBC:  Recent Labs Lab 10/27/13 1717 10/28/13 0420 10/31/13 1224 10/31/13 1235 11/01/13 0242  WBC 4.8 4.5 3.6*  --  4.1  NEUTROABS  --  2.0 1.6*  --   --   HGB 12.3* 11.1* 11.9* 13.3 11.5*  HCT 35.3* 32.5* 35.0* 39.0 33.9*  MCV 97.0 97.3 98.3  --  97.1  PLT 69* 63* 66*  --  PLATELET CLUMPS NOTED ON SMEAR, COUNT APPEARS DECREASED  Cardiac Enzymes: No results found for this basename: CKTOTAL, CKMB, CKMBINDEX, TROPONINI,  in the last 168 hours BNP (last 3 results) No results found for this basename: PROBNP,  in the last 8760 hours CBG:  Recent Labs Lab 10/27/13 1745 10/31/13 1237  GLUCAP 84 128*    Micro No results found for this or any previous visit (from the past 240 hour(s)).   Studies: Dg Chest 2 View  10/31/2013   CLINICAL DATA:  Dyspnea and confusion.  EXAM: CHEST  2 VIEW  COMPARISON:  Single view of the chest 10/28/2013 and PA and lateral chest 09/25/2012.  FINDINGS: The lungs are clear.  Heart size is normal. No pneumothorax or pleural effusion. No focal bony abnormality.  IMPRESSION: Negative chest.   Electronically Signed   By: Gary Kannerhomas  [name] M.D.   On: 10/31/2013 20:52   Ct Head Wo Contrast  10/31/2013   CLINICAL DATA:  Code stroke.  Subdural hemorrhage.  EXAM: CT HEAD WITHOUT CONTRAST  TECHNIQUE: Contiguous axial images were obtained from the base of the skull through the vertex without intravenous contrast.  COMPARISON:  10/27/2013  FINDINGS: Recent craniotomy for subdural hematoma drainage. Large subdural fluid collection on the right is similar in size. This is slightly lower density than on the prior study compatible with subacute blood. No acute hemorrhage has occurred since the prior study. The fluid collection measures approximately 22 mm in thickness  7 mm midline shift to the left is similar. There is increased effacement of the sulci on the right due to mass effect. This is slightly more prominent.  Negative for acute infarct.  Negative for mass or edema.  IMPRESSION: Large subdural fluid collection on the right shows decreased attenuation compared with 4 days ago compatible with resolving blood products. No new hemorrhage since the prior study  7 mm midline shift to the left. Effacement of the sulci on the right has progressed due to mass effect from the subdural fluid collection.  Critical Value/emergent results were called by telephone at the time of interpretation on 10/31/2013 at 12:42 pm to Gary [name], who verbally acknowledged these results.   Electronically Signed   By: Gary Palauharles  [name] M.D.   On: 10/31/2013 12:42    Scheduled Meds: . folic acid  1 mg Oral Daily  . lactulose  20 g Oral Daily  . levETIRAcetam  1,000 mg Intravenous Q12H  . multivitamin with minerals  1 tablet Oral Daily  . pantoprazole  40 mg Oral Daily  . propranolol  20 mg Oral BID  . rifaximin  550 mg Oral BID  . thiamine  100 mg Oral Daily   Or  . thiamine  100 mg Intravenous Daily    Continuous Infusions: . sodium chloride 50 mL/hr at 10/31/13 2109       Time spent: 35 minutes    [facility] HospitalELMAHI,Gary [name]  [facility] Pager [phone] If 7PM-7AM, please contact night-coverage at [email or web address], password [email or web address] 11/01/2013, 11:48 AM  LOS: 1 day

## 2013-11-01 NOTE — Progress Notes (Signed)
Report called to Sherrilyn RistKari RN on 4N.

## 2013-11-01 NOTE — Progress Notes (Signed)
Patient refused 2200 medications he is not oriented to situation, place, time, and will not allow treatment at this time he has a sitter in the room with him and does cooperate minimally with her.

## 2013-11-01 NOTE — Progress Notes (Signed)
Dr. Arthor CaptainElmahi returned cal at 1342. He seems unconcerned and just said it was just confusion. Still ok with transfer but to change transfer to 4N. Will continue to monitor.

## 2013-11-01 NOTE — Progress Notes (Signed)
Subjective: Patient improved.   Exam: Filed Vitals:   11/01/13 0800  BP: 130/74  Pulse: 51  Temp: 98.3 F (36.8 C)  Resp: 12   Gen: In bed, NAD MS: awake, alert, oriented. Impulsive.  VH:QIONGCN:PERRL, EOMI, VFF Motor: MAEW Sensory:intact to LT  EEG showed no ongoing seizure.    Impression: 53 yo M with recent SDH evacuation who presented with seizure and post-ictal confusion. I do wonder if he has some impulsivity from his previous injury, but no signs of ongoing seizure activity.   Recommendations: 1) continue keppra at increased dose of 1gm BID 2) No further recommendations at this time. Neurology will sign off. Please call with any questions or concerns.   Ritta SlotMcNeill Bijan Ridgley, MD [facility] [phone]  If 7pm- 7am, please page neurology on call as listed in AMION.

## 2013-11-01 NOTE — Progress Notes (Signed)
Paged Dr. Arthor CaptainElmahi at 1330 regarding pt stating that he cannot see anything. Awaiting call back.

## 2013-11-01 NOTE — Progress Notes (Signed)
Attempted to call report

## 2013-11-02 DIAGNOSIS — D649 Anemia, unspecified: Secondary | ICD-10-CM

## 2013-11-02 LAB — HEPATITIS C ANTIBODY
HCV Ab: REACTIVE — AB
HCV Ab: REACTIVE — AB

## 2013-11-02 MED ORDER — LEVETIRACETAM 500 MG PO TABS
1000.0000 mg | ORAL_TABLET | Freq: Two times a day (BID) | ORAL | Status: DC
  Administered 2013-11-02 – 2013-11-03 (×3): 1000 mg via ORAL
  Filled 2013-11-02 (×3): qty 2

## 2013-11-02 MED ORDER — PHYTONADIONE 5 MG PO TABS
10.0000 mg | ORAL_TABLET | Freq: Once | ORAL | Status: AC
  Administered 2013-11-02: 10 mg via ORAL
  Filled 2013-11-02 (×2): qty 2

## 2013-11-02 NOTE — Evaluation (Signed)
Physical Therapy Evaluation Patient Details Name: Gary CrossRandall S Sauceda MRN: [id number] DOB: 03/03/1960 Today's Date: 11/02/2013   History of Present Illness  53 y.o. male [facility] with confusion, left sided facial droop and left sided weakness.  Dx with seizure.  CT of head showed SDH (not much change from last scan) with 7 mm midline shift.  Pt with significant PMHx of fall with SDH s/p craniotomy on 10/21/13, cirrhosis, hepatitis, and continued alcohol use (per ED notes).    Clinical Impression  Pt has mildly staggering gait pattern, but no external assist needed to prevent LOB during gait and DGI.  Supervision was for safety.  He does show signs of cognitive deficits that could relate to his safety at home, so I am recommending 24/7 supervision at discharge.  Pt is likely close to his physical functioning, so no f/u PT recommended at this time.   PT to follow acutely for deficits listed below.       Follow Up Recommendations Supervision/Assistance - 24 hour (due to cognitive deficits)    Equipment Recommendations  None recommended by PT    Recommendations for Other Services   SLP cognitive eval    Precautions / Restrictions Precautions Precautions: Fall Precaution Comments: mild L inattention, and mildly staggering gait pattern.       Mobility  Bed Mobility Overal bed mobility: Modified Independent             General bed mobility comments: pt using bed rail to full to sitting  Transfers Overall transfer level: Needs assistance Equipment used: None Transfers: Sit to/from Stand Sit to Stand: Supervision         General transfer comment: Supervision for safety when going to standing due to fast speed of movement.   Ambulation/Gait Ambulation/Gait assistance: Supervision Ambulation Distance (Feet): 510 Feet Assistive device: None Gait Pattern/deviations: Step-through pattern;Staggering right;Staggering left     General Gait Details: Mildly  staggering gait pattern when multi tasking.  Supervision for safety.   Stairs Stairs: Yes Stairs assistance: Supervision Stair Management: One rail Right;Alternating pattern;Forwards Number of Stairs: 9 General stair comments: pt needed supervision only when turning at the top of the stair for small stagger.  When he uses hand rail, he has no issues on the stairs.          Balance Overall balance assessment: History of Falls                               Standardized Balance Assessment Standardized Balance Assessment : Dynamic Gait Index   Dynamic Gait Index Level Surface: Normal Change in Gait Speed: Normal Gait with Horizontal Head Turns: Mild Impairment Gait with Vertical Head Turns: Normal Gait and Pivot Turn: Normal Step Over Obstacle: Mild Impairment Step Around Obstacles: Normal Steps: Mild Impairment Total Score: 21       Pertinent Vitals/Pain Pain Assessment: No/denies pain    Home Living Family/patient expects to be discharged to:: Private residence Living Arrangements: Alone Available Help at Discharge: Family;Available PRN/intermittently (brother and his wife and children live next door) Type of Home: House Home Access: Stairs to enter Entrance Stairs-Rails: None Entrance Stairs-Number of Steps: 1 Home Layout: One level Home Equipment: Environmental consultantWalker - 2 wheels;Walker - 4 wheels;Cane - single point;Shower seat - built in;Hand held shower head      Prior Function Level of Independence: Independent         Comments: per pt he still  drives     Hand Dominance   Dominant Hand: Left    Extremity/Trunk Assessment   Upper Extremity Assessment: Overall WFL for tasks assessed           Lower Extremity Assessment: Overall WFL for tasks assessed      Cervical / Trunk Assessment: Normal  Communication   Communication: No difficulties  Cognition Arousal/Alertness: Awake/alert Behavior During Therapy: WFL for tasks  assessed/performed Overall Cognitive Status: No family/caregiver present to determine baseline cognitive functioning (A&O x 4 with some slow retrieval, and at times loses focus)                      General Comments General comments (skin integrity, edema, etc.): Pt taken to the bathroom and had a BM.  He was able to preform his own peri care, but got confused when therapist asked him to flush the commode when he was done.  Instead he sat back down on the toilet and when asked what he was doing he stated, "oh, yeah, I am done." and stood up again.           Assessment/Plan    PT Assessment Patient needs continued PT services  PT Diagnosis Difficulty walking;Abnormality of gait;Altered mental status   PT Problem List Decreased mobility;Decreased balance;Decreased cognition;Decreased knowledge of use of DME;Decreased safety awareness;Decreased knowledge of precautions  PT Treatment Interventions DME instruction;Gait training;Stair training;Functional mobility training;Therapeutic activities;Therapeutic exercise;Balance training;Neuromuscular re-education;Cognitive remediation;Patient/family education   PT Goals (Current goals can be found in the Care Plan section) Acute Rehab PT Goals Patient Stated Goal: to go home tomorrow PT Goal Formulation: With patient Time For Goal Achievement: 11/16/13 Potential to Achieve Goals: Good    Frequency Min 3X/week    End of Session Equipment Utilized During Treatment: Gait belt Activity Tolerance: Patient tolerated treatment well Patient left: in bed;with call bell/phone within reach;with nursing/sitter in room (sitter)           Time: [id number] PT Time Calculation (min): 17 min   Charges:   PT Evaluation $Initial PT Evaluation Tier I: 1 Procedure PT Treatments $Gait Training: 8-22 mins        Brighten Orndoff B. Mertice Uffelman, PT, DPT [phone]   11/02/2013, 10:11 AM

## 2013-11-02 NOTE — Progress Notes (Signed)
Overall stable. No new issues. No new recommendations for my standpoint.

## 2013-11-02 NOTE — Progress Notes (Signed)
[facility] PROGRESS NOTE   Gary [name] ZOX:[id number] DOB: 09/09/1960 DOA: 10/31/2013 PCP: Rodman KeyHarrison, Dawn S, FNP  HPI/Subjective: Seen with cyst considered bedside, denies complaints. Reported he had problem with vision yesterday, today he can read the caution about the surveillance camera notice across the room.  Assessment/Plan: Active Problems:   Subdural hematoma   Cirrhosis   Seizure   Seizure:  Received keppra load. Neuro recommend 1 g BID. EEG ordered. Neurology following.  No evidence of aspiration, no symptoms no leukocytosis.  Subdural hematoma Ct with 7 mm midline shift. Neurosurgery consulted.  Evaluated by Dr. Jordan LikesPool, recommended no other intervention.  Pancytopenia; in setting of cirrhosis and alcohol use. Monitor.   Alcohol use;  Counseled extensively Monitor on CIWA protocol.  Per family maybe last drink was 2 days ago. Start Thiamine and folate.   Cirrhosis; restart Lasix, spironolactone and heart healthy diet  Code Status: Full code Family Communication: Plan discussed with the patient. Disposition Plan: Remains inpatient, ambulate, likely to be discharged in the morning.   Consultants:  Neurosurgery.  Neurology  Procedures:  EEG: The asymmetry seen on this EEG is consistent with the patient's known subdural fluid collection. There was no seizure or seizure predisposition recorded on this study. Please note that a normal eeg does not exclude the possibility of epilepsy.   Antibiotics:  None   Objective: Filed Vitals:   11/02/13 0939  BP: 111/62  Pulse: 69  Temp: 98.8 F (37.1 C)  Resp: 18    Intake/Output Summary (Last 24 hours) at 11/02/13 0954 Last data filed at 11/01/13 1800  Gross per 24 hour  Intake    320 ml  Output    800 ml  Net   -480 ml   Filed Weights   10/31/13 1832  Weight: 92.8 kg (204 lb 9.4 oz)    Exam: General: Alert and awake, oriented x3, not in any acute distress. HEENT: anicteric sclera,  pupils reactive to light and accommodation, EOMI CVS: S1-S2 clear, no murmur rubs or gallops Chest: clear to auscultation bilaterally, no wheezing, rales or rhonchi Abdomen: soft nontender, nondistended, normal bowel sounds, no organomegaly Extremities: no cyanosis, clubbing or edema noted bilaterally Neuro: Cranial nerves II-XII intact, no focal neurological deficits  Data Reviewed: Basic Metabolic Panel:  Recent Labs Lab 10/27/13 1748 10/28/13 0420 10/31/13 1224 10/31/13 1235 11/01/13 0242  NA 138 141 139 141 139  K 4.4 4.1 4.2 4.0 4.0  CL 100 106 105 107 104  CO2 26 25 22   --  24  GLUCOSE 86 113* 145* 144* 84  BUN 14 12 9 7 7   CREATININE 0.73 0.67 0.62 0.60 0.70  CALCIUM 9.1 8.6 8.5  --  8.6   Liver Function Tests:  Recent Labs Lab 10/27/13 1748 10/28/13 0420 10/31/13 1224 11/01/13 0242  AST 45* 38* 52* 41*  ALT 20 17 24 21   ALKPHOS 116 97 199* 116  BILITOT 2.3* 2.0* 1.6* 2.5*  PROT 6.7 5.6* 6.3 5.5*  ALBUMIN 3.1* 2.5* 2.7* 2.4*   No results found for this basename: LIPASE, AMYLASE,  in the last 168 hours  Recent Labs Lab 10/31/13 1756  AMMONIA 83*   CBC:  Recent Labs Lab 10/27/13 1717 10/28/13 0420 10/31/13 1224 10/31/13 1235 11/01/13 0242  WBC 4.8 4.5 3.6*  --  4.1  NEUTROABS  --  2.0 1.6*  --   --   HGB 12.3* 11.1* 11.9* 13.3 11.5*  HCT 35.3* 32.5* 35.0* 39.0 33.9*  MCV 97.0 97.3 98.3  --  97.1  PLT 69* 63* 66*  --  PLATELET CLUMPS NOTED ON SMEAR, COUNT APPEARS DECREASED   Cardiac Enzymes: No results found for this basename: CKTOTAL, CKMB, CKMBINDEX, TROPONINI,  in the last 168 hours BNP (last 3 results) No results found for this basename: PROBNP,  in the last 8760 hours CBG:  Recent Labs Lab 10/27/13 1745 10/31/13 1237  GLUCAP 84 128*    Micro No results found for this or any previous visit (from the past 240 hour(s)).   Studies: Dg Chest 2 View  10/31/2013   CLINICAL DATA:  Dyspnea and confusion.  EXAM: CHEST  2 VIEW   COMPARISON:  Single view of the chest 10/28/2013 and PA and lateral chest 09/25/2012.  FINDINGS: The lungs are clear. Heart size is normal. No pneumothorax or pleural effusion. No focal bony abnormality.  IMPRESSION: Negative chest.   Electronically Signed   By: Drusilla Kannerhomas  Dalessio M.D.   On: 10/31/2013 20:52   Ct Head Wo Contrast  10/31/2013   CLINICAL DATA:  Code stroke.  Subdural hemorrhage.  EXAM: CT HEAD WITHOUT CONTRAST  TECHNIQUE: Contiguous axial images were obtained from the base of the skull through the vertex without intravenous contrast.  COMPARISON:  10/27/2013  FINDINGS: Recent craniotomy for subdural hematoma drainage. Large subdural fluid collection on the right is similar in size. This is slightly lower density than on the prior study compatible with subacute blood. No acute hemorrhage has occurred since the prior study. The fluid collection measures approximately 22 mm in thickness  7 mm midline shift to the left is similar. There is increased effacement of the sulci on the right due to mass effect. This is slightly more prominent.  Negative for acute infarct.  Negative for mass or edema.  IMPRESSION: Large subdural fluid collection on the right shows decreased attenuation compared with 4 days ago compatible with resolving blood products. No new hemorrhage since the prior study  7 mm midline shift to the left. Effacement of the sulci on the right has progressed due to mass effect from the subdural fluid collection.  Critical Value/emergent results were called by telephone at the time of interpretation on 10/31/2013 at 12:42 pm to Dr. Amada JupiterKirkpatrick, who verbally acknowledged these results.   Electronically Signed   By: Marlan Palauharles  Clark M.D.   On: 10/31/2013 12:42    Scheduled Meds: . folic acid  1 mg Oral Daily  . furosemide  40 mg Oral Daily  . lactulose  20 g Oral Daily  . levETIRAcetam  1,000 mg Intravenous Q12H  . multivitamin with minerals  1 tablet Oral Daily  . pantoprazole  40 mg Oral  Daily  . propranolol  20 mg Oral BID  . rifaximin  550 mg Oral BID  . spironolactone  25 mg Oral BID  . thiamine  100 mg Oral Daily   Or  . thiamine  100 mg Intravenous Daily   Continuous Infusions:       Time spent: 35 minutes    [facility] CenterELMAHI,Felishia Wartman A  [facility] Pager [phone] If 7PM-7AM, please contact night-coverage at [email or web address], password [facility] 11/02/2013, 9:54 AM  LOS: 2 days

## 2013-11-03 DIAGNOSIS — R0600 Dyspnea, unspecified: Secondary | ICD-10-CM

## 2013-11-03 LAB — PROTIME-INR
INR: 1.46 (ref 0.00–1.49)
Prothrombin Time: 17.9 seconds — ABNORMAL HIGH (ref 11.6–15.2)

## 2013-11-03 LAB — COMPREHENSIVE METABOLIC PANEL
ALBUMIN: 2.6 g/dL — AB (ref 3.5–5.2)
ALT: 21 U/L (ref 0–53)
AST: 42 U/L — ABNORMAL HIGH (ref 0–37)
Alkaline Phosphatase: 136 U/L — ABNORMAL HIGH (ref 39–117)
Anion gap: 9 (ref 5–15)
BILIRUBIN TOTAL: 1.6 mg/dL — AB (ref 0.3–1.2)
BUN: 9 mg/dL (ref 6–23)
CHLORIDE: 104 meq/L (ref 96–112)
CO2: 25 meq/L (ref 19–32)
CREATININE: 0.77 mg/dL (ref 0.50–1.35)
Calcium: 8.5 mg/dL (ref 8.4–10.5)
GLUCOSE: 101 mg/dL — AB (ref 70–99)
Potassium: 4 mEq/L (ref 3.7–5.3)
Sodium: 138 mEq/L (ref 137–147)
Total Protein: 6 g/dL (ref 6.0–8.3)

## 2013-11-03 MED ORDER — FUROSEMIDE 40 MG PO TABS: 40.0000 mg | ORAL_TABLET | Freq: Every day | ORAL | Status: DC

## 2013-11-03 MED ORDER — LEVETIRACETAM 1000 MG PO TABS: 1000.0000 mg | ORAL_TABLET | Freq: Two times a day (BID) | ORAL | Status: DC

## 2013-11-03 MED ORDER — SPIRONOLACTONE 50 MG PO TABS: 50.0000 mg | ORAL_TABLET | Freq: Every day | ORAL | Status: DC

## 2013-11-03 NOTE — Clinical Documentation Improvement (Signed)
Possible Conditions:  BRAIN HERNIATION CEREBRAL EDEMA Other Not able to determine  Supporting Information: Risk Factors: "SDH", craniotomy on 10/22/2013."SEIZURE"  Sign & Symptoms:"Lethargic", "Patient was found confuse, breathing hard by family. Patient was also notice to have left facial droop and not following command and left side hand weakness"  Diagnostics: Head CT 10-31-13 7 mm midline shift to the left. Effacement of the sulci on the right has progressed due to mass effect from the subdural fluid collection.   Treatment:Keppra, Ativan, &  Neurosurgery consulted.  Thank You, Nevin BloodgoodJoan B Windle Huebert, RN, BSN, CCDS,Clinical Documentation Specialist:  [phone]  [phone]=[facility] [place]- Health Information Management

## 2013-11-03 NOTE — Progress Notes (Signed)
Patient with discharge orders. Son Gary [name](Ryan) called and informed. Son would like MD to call him prior to patient leaving. Awaiting on transportation. MD paged.   Sim BoastHavy, RN

## 2013-11-03 NOTE — Progress Notes (Signed)
Physical Therapy Treatment Patient Details Name: Gary CrossRandall S Feldstein MRN: [id number] DOB: 04/21/1960 Today's Date: 11/03/2013    History of Present Illness 53 y.o. male [facility] with confusion, left sided facial droop and left sided weakness.  Dx with seizure.  CT of head showed SDH (not much change from last scan) with 7 mm midline shift.  Pt with significant PMHx of fall with SDH s/p craniotomy on 10/21/13, cirrhosis, hepatitis, and continued alcohol use (per ED notes).      PT Comments    Pt is progressing well with both mobility/balance, and cognition today.  He seems more goal oriented and had no instances of lack of attention/focus like he did yesterday.  Balance HEP initiated.  If he is here tomorrow or the next day will provide with HEP handout.  PT continues to recommend that he does not need PT f/u at discharge.  PT will continue to follow acutely.    Follow Up Recommendations  Supervision/Assistance - 24 hour     Equipment Recommendations  None recommended by PT    Recommendations for Other Services   NA     Precautions / Restrictions Precautions Precautions: Fall Precaution Comments: mild L inattention, and mildly staggering gait pattern.     Mobility  Bed Mobility Overal bed mobility: Independent             General bed mobility comments: HOB flat, no rail use today.   Transfers Overall transfer level: Needs assistance Equipment used: None Transfers: Sit to/from Stand Sit to Stand: Modified independent (Device/Increase time)         General transfer comment: pt uses his hands during transitions.   Ambulation/Gait Ambulation/Gait assistance: Supervision Ambulation Distance (Feet): 510 Feet Assistive device: None Gait Pattern/deviations: Step-through pattern;Staggering right;Drifts right/left   Gait velocity interpretation: at or above normal speed for age/gender General Gait Details: Pt tends to list ever so slightly to the right  during gait and had one instance of running into the right door jam and he reports this has been the case since his brain surgery.    Stairs Stairs: Yes Stairs assistance: Modified independent (Device/Increase time) Stair Management: No rails;Alternating pattern;Forwards Number of Stairs: 5 General stair comments: Pt was able to do stairs safely with slightly slower speed and some self correction for balance without using rails.          Balance Overall balance assessment: History of Falls;Needs assistance               Single Leg Stance - Right Leg: 5 Single Leg Stance - Left Leg: 7 Tandem Stance - Right Leg: 19 Tandem Stance - Left Leg: 25 [place] - Eyes Opened: 30 [place] - Eyes Closed: 30 High level balance activites: Side stepping;Braiding;Backward walking;Direction changes;Turns;Sudden stops;Head turns High Level Balance Comments: supervision for higher level balance practice.     Cognition      General Comments General comments (skin integrity, edema, etc.): Reviewed fall precautions as well as the recommendation for pt to use a cane when outside the house trying to walk over uneven surfaces.       Pertinent Vitals/Pain Pain Assessment: No/denies pain           PT Goals (current goals can now be found in the care plan section) Acute Rehab PT Goals Patient Stated Goal: to go home today Progress towards PT goals: Progressing toward goals    Frequency  Min 3X/week    PT Plan Current plan remains appropriate  End of Session Equipment Utilized During Treatment: Gait belt Activity Tolerance: Patient tolerated treatment well Patient left: in bed;with call bell/phone within reach;with bed alarm set     Time: 4696-29521108-1123 PT Time Calculation (min): 15 min  Charges:  $Gait Training: 8-22 mins                      Ephrem Carrick B. Clemencia Helzer, PT, DPT [phone]   11/03/2013, 11:33 AM

## 2013-11-03 NOTE — Clinical Social Work Note (Signed)
CSW Consult Acknowledgement:   CSW met the patient at the bedside to discuss substance abuse consult. CSW introduce self and purpose of visit. Patient reported that he did not feel like talking about current substance use. At this time there are no additional idenified needs. CSW has signed off.   [place], MSW, [place]

## 2013-11-03 NOTE — Progress Notes (Signed)
Discharge instructions reviewed with patient and son Alycia Rossetti(Ryan). RXs given to son. All questions answered at this time. No other concerns voice. No other distress noted. Home with son.  Sim BoastHavy, RN

## 2013-11-03 NOTE — Progress Notes (Signed)
Updated son regarding pt's discharge orders. Son verbalized that he spoke to MD already and will come pick up patient as soon as he can. Will continue to monitor.  Sim BoastHavy, RN

## 2013-11-03 NOTE — Discharge Summary (Signed)
Physician Discharge Summary  Gary CrossRandall S Brick ZOX:[id number] DOB: 06/08/1960 DOA: 10/31/2013  PCP: Rodman KeyHarrison, Dawn S, FNP  Admit date: 10/31/2013 Discharge date: 11/03/2013  Time spent: 40 minutes  Recommendations for Outpatient Follow-up:  1. Followup with primary care physician in one week.  Discharge Diagnoses:  Active Problems:   Subdural hematoma   Cirrhosis   Seizure   Discharge Condition: Stable  Diet recommendation: Heart healthy  Filed Weights   10/31/13 1832  Weight: 92.8 kg (204 lb 9.4 oz)    History of present illness:  Gary [name] is a 53 y.o. male with PMH signigicant for subdural hematoma S/P 10-07 , recently discharge from hospital 10-13 at that time he was diagnosed with seizure, he was discharge on keppra. Patient was found confuse, breathing hard by family. Patient was also notice to have left facial droop and not following command and left side hand weakness. He was evaluated by neurologist who think patient had seizure episode. Patient received ativan and keppra in the ED.  Patient is lethargic but wake up to answer questions, follow some command.  Per family patient continue to drink alcohol.  Ct head showed; Large subdural fluid collection on the right shows decreased attenuation compared with 4 days ago compatible with resolving blood  products. No new hemorrhage since the prior study 7 mm midline shift to the left. Effacement of the sulci on the right has progressed due to mass effect from the subdural fluid collection.   Hospital Course:   Seizure:  He is not following commands, confused and left facial droop to be secondary to seizure per neurology. Received keppra load. Neuro recommend 1 g BID. No evidence of aspiration, no symptoms no leukocytosis.  At the time of discharge Keppra 1 g twice a day.  Subdural hematoma  Ct with 7 mm midline shift. Neurosurgery consulted.  Evaluated by Dr. Jordan LikesPool, recommended no other intervention, as this seen  before.  Pancytopenia; in setting of cirrhosis and alcohol use. Monitor.   Alcohol use;  Counseled extensively Monitor on CIWA protocol.  Per family maybe last drink was 2 days ago. Start Thiamine and folate.  No symptoms or signs of alcohol withdrawal.  Cirrhosis Restarted back on heart healthy diet, Lasix and Aldactone Lasix dose adjusted to 40 and Aldactone to 50 by mouth daily   Procedures:  None  Consultations:  Neurology.  Neurosurgery  Discharge Exam: Filed Vitals:   11/03/13 1207  BP: 100/54  Pulse: 51  Temp:   Resp:    General: Alert and awake, oriented x3, but overall he is slow in response HEENT: Right side of the head shaved with staples. CVS: S1-S2 clear, no murmur rubs or gallops Chest: clear to auscultation bilaterally, no wheezing, rales or rhonchi Abdomen: soft nontender, nondistended, normal bowel sounds, no organomegaly Extremities: no cyanosis, clubbing or edema noted bilaterally Neuro: Cranial nerves II-XII intact, no focal neurological deficits  Discharge Instructions You were cared for by a hospitalist during your hospital stay. If you have any questions about your discharge medications or the care you received while you were in the hospital after you are discharged, you can call the unit and asked to speak with the hospitalist on call if the hospitalist that took care of you is not available. Once you are discharged, your primary care physician will handle any further medical issues. Please note that NO REFILLS for any discharge medications will be authorized once you are discharged, as it is imperative that you return to your primary  care physician (or establish a relationship with a primary care physician if you do not have one) for your aftercare needs so that they can reassess your need for medications and monitor your lab values.  Discharge Instructions   Diet - low sodium heart healthy    Complete by:  As directed      Increase activity  slowly    Complete by:  As directed           Current Discharge Medication List    CONTINUE these medications which have CHANGED   Details  furosemide (LASIX) 40 MG tablet Take 1 tablet (40 mg total) by mouth daily. Qty: 30 tablet, Refills: 0    levETIRAcetam (KEPPRA) 1000 MG tablet Take 1 tablet (1,000 mg total) by mouth 2 (two) times daily. Qty: 60 tablet, Refills: 0    spironolactone (ALDACTONE) 50 MG tablet Take 1 tablet (50 mg total) by mouth daily. Qty: 30 tablet, Refills: 0      CONTINUE these medications which have NOT CHANGED   Details  acetaminophen (TYLENOL) 500 MG tablet Take 1,000 mg by mouth every 6 (six) hours as needed for moderate pain.    dexlansoprazole (DEXILANT) 60 MG capsule Take 60 mg by mouth daily.    dicyclomine (BENTYL) 10 MG capsule Take 10 mg by mouth 3 (three) times daily as needed for spasms.     lactulose (CHRONULAC) 10 GM/15ML solution Take 20 g by mouth daily.     propranolol (INDERAL) 20 MG tablet Take 20 mg by mouth 2 (two) times daily.    rifaximin (XIFAXAN) 550 MG TABS tablet Take 550 mg by mouth 2 (two) times daily.    sertraline (ZOLOFT) 100 MG tablet Take 100 mg by mouth at bedtime.        No Known Allergies    The results of significant diagnostics from this hospitalization (including imaging, microbiology, ancillary and laboratory) are listed below for reference.    Significant Diagnostic Studies: Dg Chest 2 View  10/31/2013   CLINICAL DATA:  Dyspnea and confusion.  EXAM: CHEST  2 VIEW  COMPARISON:  Single view of the chest 10/28/2013 and PA and lateral chest 09/25/2012.  FINDINGS: The lungs are clear. Heart size is normal. No pneumothorax or pleural effusion. No focal bony abnormality.  IMPRESSION: Negative chest.   Electronically Signed   By: Drusilla Kanner M.D.   On: 10/31/2013 20:52   Ct Head Wo Contrast  10/31/2013   CLINICAL DATA:  Code stroke.  Subdural hemorrhage.  EXAM: CT HEAD WITHOUT CONTRAST  TECHNIQUE:  Contiguous axial images were obtained from the base of the skull through the vertex without intravenous contrast.  COMPARISON:  10/27/2013  FINDINGS: Recent craniotomy for subdural hematoma drainage. Large subdural fluid collection on the right is similar in size. This is slightly lower density than on the prior study compatible with subacute blood. No acute hemorrhage has occurred since the prior study. The fluid collection measures approximately 22 mm in thickness  7 mm midline shift to the left is similar. There is increased effacement of the sulci on the right due to mass effect. This is slightly more prominent.  Negative for acute infarct.  Negative for mass or edema.  IMPRESSION: Large subdural fluid collection on the right shows decreased attenuation compared with 4 days ago compatible with resolving blood products. No new hemorrhage since the prior study  7 mm midline shift to the left. Effacement of the sulci on the right has progressed due to mass  effect from the subdural fluid collection.  Critical Value/emergent results were called by telephone at the time of interpretation on 10/31/2013 at 12:42 pm to Dr. Amada Jupiter, who verbally acknowledged these results.   Electronically Signed   By: Marlan Palau M.D.   On: 10/31/2013 12:42   Ct Head Wo Contrast   (if New Onset Seizure And/or Head Trauma)  10/27/2013   CLINICAL DATA:  Status post seizure with a fall and a blow to the back of the head. History of subdural hematoma on the right; status post evacuation 10/21/2013.  EXAM: CT HEAD WITHOUT CONTRAST  TECHNIQUE: Contiguous axial images were obtained from the base of the skull through the vertex without intravenous contrast.  COMPARISON:  Head CT scan 10/22/2013 and 10/21/2013.  FINDINGS: Subdural drainage catheter on the right has been removed since the most recent head CT scan. Craniotomy defect on the right is again seen. There is a small amount of right pneumocephalus. A mixed attenuation  extra-axial fluid collection over the right convexities measures 2.2 cm in diameter compared to 2.5 cm on the most recent head CT. Right to left midline shift of 0.5 cm is unchanged. There is no hydrocephalus. No subarachnoid hemorrhage or evidence of acute infarction is identified. No fracture is seen.  IMPRESSION: Subacute subdural hematoma on the right with a 0.5 cm right to left midline shift, unchanged. Negative for new hemorrhage.   Electronically Signed   By: Drusilla Kanner M.D.   On: 10/27/2013 18:41   Ct Head Wo Contrast  10/22/2013   CLINICAL DATA:  Postop subdural evacuation.  EXAM: CT HEAD WITHOUT CONTRAST  TECHNIQUE: Contiguous axial images were obtained from the base of the skull through the vertex without contrast.  COMPARISON:  Previous head CT 10/21/2013 from [facility].  FINDINGS: The patient has undergone a small RIGHT frontal craniotomy for subdural evacuation. Subdural drain good position in the frontal compartment. There is considerable residual hyperdense and low attenuation fluid in the subdural space over the frontal and parietal lobes. Midline shift RIGHT to LEFT is improved, currently measuring 5 mm. Moderate pneumocephalus. No parenchymal hematoma or hydrocephalus.  IMPRESSION: Considerable residual subdural hematoma remains. Subdural drain good position.   Electronically Signed   By: Davonna Belling M.D.   On: 10/22/2013 07:40   Dg Chest Port 1 View  10/28/2013   CLINICAL DATA:  Attached.  Cirrhosis.  EXAM: PORTABLE CHEST - 1 VIEW  COMPARISON:  10/22/2013  FINDINGS: Improved left basilar atelectasis with minimal residual volume loss. Left upper lung zone and right lung are clear. Healing left lateral lower rib fracture. No pneumothorax. Normal heart size.  IMPRESSION: Improved left basilar atelectasis.  Otherwise clear lungs.   Electronically Signed   By: Maryclare Bean M.D.   On: 10/28/2013 07:55   Dg Chest Port 1 View  10/22/2013   CLINICAL DATA:  Extubation.  EXAM: PORTABLE CHEST - 1  VIEW  COMPARISON:  10/21/2013.  FINDINGS: Mediastinum and hilar structures normal. Left lower lobe atelectasis and/or infiltrate noted. No pleural effusion or pneumothorax. Cardiomegaly with normal pulmonary vascularity. No acute bony abnormality.  IMPRESSION: 1. Left lower lobe subsegmental atelectasis and or infiltrate. 2. Cardiomegaly.  Normal pulmonary vascularity.   Electronically Signed   By: Maisie Fus  Register   On: 10/22/2013 08:06   Dg Chest Port 1 View  10/21/2013   CLINICAL DATA:  Short of breath.  Congestion.  EXAM: PORTABLE CHEST - 1 VIEW  COMPARISON:  None.  FINDINGS: Normal heart size. Clear lungs.  No pneumothorax. Healing left lateral rib fracture.  IMPRESSION: No active disease.   Electronically Signed   By: Maryclare BeanArt  Hoss M.D.   On: 10/21/2013 16:57    Microbiology: No results found for this or any previous visit (from the past 240 hour(s)).   Labs: Basic Metabolic Panel:  Recent Labs Lab 10/27/13 1748 10/28/13 0420 10/31/13 1224 10/31/13 1235 11/01/13 0242 11/03/13 0553  NA 138 141 139 141 139 138  K 4.4 4.1 4.2 4.0 4.0 4.0  CL 100 106 105 107 104 104  CO2 26 25 22   --  24 25  GLUCOSE 86 113* 145* 144* 84 101*  BUN 14 12 9 7 7 9   CREATININE 0.73 0.67 0.62 0.60 0.70 0.77  CALCIUM 9.1 8.6 8.5  --  8.6 8.5   Liver Function Tests:  Recent Labs Lab 10/27/13 1748 10/28/13 0420 10/31/13 1224 11/01/13 0242 11/03/13 0553  AST 45* 38* 52* 41* 42*  ALT 20 17 24 21 21   ALKPHOS 116 97 199* 116 136*  BILITOT 2.3* 2.0* 1.6* 2.5* 1.6*  PROT 6.7 5.6* 6.3 5.5* 6.0  ALBUMIN 3.1* 2.5* 2.7* 2.4* 2.6*   No results found for this basename: LIPASE, AMYLASE,  in the last 168 hours  Recent Labs Lab 10/31/13 1756  AMMONIA 83*   CBC:  Recent Labs Lab 10/27/13 1717 10/28/13 0420 10/31/13 1224 10/31/13 1235 11/01/13 0242  WBC 4.8 4.5 3.6*  --  4.1  NEUTROABS  --  2.0 1.6*  --   --   HGB 12.3* 11.1* 11.9* 13.3 11.5*  HCT 35.3* 32.5* 35.0* 39.0 33.9*  MCV 97.0 97.3 98.3   --  97.1  PLT 69* 63* 66*  --  PLATELET CLUMPS NOTED ON SMEAR, COUNT APPEARS DECREASED   Cardiac Enzymes: No results found for this basename: CKTOTAL, CKMB, CKMBINDEX, TROPONINI,  in the last 168 hours BNP: BNP (last 3 results) No results found for this basename: PROBNP,  in the last 8760 hours CBG:  Recent Labs Lab 10/27/13 1745 10/31/13 1237  GLUCAP 84 128*       Signed:  Victoriya Pol A  [facility] 11/03/2013, 12:17 PM

## 2013-11-07 NOTE — Discharge Summary (Signed)
Patient seen and examined, agree with above note.  I dictated the care and orders written for this patient under my direction.  Wesam G Yacoub, MD [phone] 

## 2013-11-13 ENCOUNTER — Telehealth (HOSPITAL_BASED_OUTPATIENT_CLINIC_OR_DEPARTMENT_OTHER): Payer: Self-pay | Admitting: Emergency Medicine

## 2013-12-04 ENCOUNTER — Ambulatory Visit: Payer: Medicaid Other | Admitting: Neurology

## 2013-12-05 ENCOUNTER — Telehealth: Payer: Self-pay | Admitting: Neurology

## 2013-12-05 NOTE — Telephone Encounter (Signed)
No showed NP appt w/ Dr. Karel JarvisAquino 12/04/13. Referred by inpt hosp.  Alcario DroughtErica - please send no show letter to pt / Sherri S.

## 2013-12-08 ENCOUNTER — Encounter: Payer: Self-pay | Admitting: *Deleted

## 2013-12-08 NOTE — Progress Notes (Signed)
No show letter sent for 12/04/2013 

## 2014-05-08 NOTE — Consult Note (Signed)
PATIENT NAME:  Gary [name], Gary [name] MR#:  [id number] DATE OF BIRTH:  [birth year]  DATE OF CONSULTATION:  09/25/2012  REFERRING PHYSICIAN:  Su Leyobert L. Kinner, MD CONSULTING PHYSICIAN:  Brighid Koch A. Allena KatzPatel, MD  PRIMARY GASTROENTEROLOGIST: Ezzard StandingPaul Y. Bluford Kaufmannh, MD  PRIMARY CARE PHYSICIAN: None.   REASON FOR CONSULTATION: Possible hepatic encephalopathy.   HISTORY OF PRESENT ILLNESS: Mr. Gary [name] is a 54 year old Caucasian gentleman with history of cirrhosis of liver, hepatitis C and a history of chronic hyperbilirubinemia  secondary to underlying cirrhosis, comes to the Emergency Room 2 days in a row with significant symptoms of anxiety and depression. The patient was discharged yesterday back to home, comes back again since he is not able to control his anxiety symptoms at home. He has been worried about his home his mother left for the 3 brothers and he tells me, "I need help with my anxiety and depression." He clearly denies any suicidal ideation to me. In the Emergency Room, the patient was found to have normal ammonia x 2, and no signs of infection or dehydration were noted. He reported earlier to the ER physician and RN about a baby getting crushed by a coyote at a  barbecue grill party. Talking to him directly, the patient denies any hallucinations or hearing any voices. He during my conversation most of the time looked very anxious and nervous and has had shaking along with tremors in his upper extremities. When the patient was asked to calm down, his tremors resolved and restarted back again when he started mentioning and talking about his social situation. Internal medicine was consulted for possible symptoms suggestive of encephalopathy. He did received empirically a dose of lactulose in the Emergency Room.   PAST MEDICAL HISTORY: 1.  Chronic liver disease due to hepatitis C and cirrhosis of liver, followed by Dr. Bluford Kaufmannh.  2.  Depression. He is on Zoloft.  3.  History of alcohol abuse in the past. The patient states he  has quit. 4.  Past history of abdominal pain and work-up was negative for acute cholecystitis. Surgical consultation was made. This was during the first week of September 2014.   ALLERGIES: No known drug allergies.   CURRENT MEDICATIONS: 1.  Spironolactone 25 mg daily.  2.  Protonix 40 mg daily.  3.  Dexilant 60 mg daily.  4.  Dicyclomine 10 mg 3 times a day.  5.  Zoloft 100 mg daily.   FAMILY HISTORY: The patient says he lives in his mother'[name] house, who passed away. Both his brothers try to help him meet ends. Both the parents had heart disease. Mother also had breast cancer.   SOCIAL HISTORY: No smoking. Used to drink in the past. No other illicit drugs.   REVIEW OF SYSTEMS:    CONSTITUTIONAL: No fever, fatigue, weakness.  EYES: No blurred or double vision or glaucoma.  ENT: No tinnitus, ear pain, hearing loss.  RESPIRATORY: No cough, wheeze, hemoptysis or COPD.  CARDIOVASCULAR: No chest pain, orthopnea, edema.  GASTROINTESTINAL: No nausea, diarrhea, vomiting, abdominal pain or GERD.  GENITOURINARY: No dysuria, hematuria or frequency.  ENDOCRINE: No polyuria, nocturia or thyroid problems.  HEMATOLOGIC: No anemia or easy bruising.  SKIN: No acne or rash.  MUSCULOSKELETAL: Positive for arthritis. No swelling or gout.  NEUROLOGIC: No CVA, TIA, seizures or dysarthria.  PSYCHIATRIC: Significantly positive for anxiety.   PHYSICAL EXAMINATION: GENERAL: The patient is well built, well nourished.  VITAL SIGNS: He is afebrile. Pulse is 76. Blood pressure is 112/57. Pulse oximetry is 97%  on room air.  HEENT: Atraumatic, normocephalic. PERRLA. Scleral icterus present.  NECK: Supple. No JVD. No carotid bruit.  LUNGS: Clear to auscultation bilaterally. No rales, rhonchi, respiratory distress or labored breathing.  CARDIOVASCULAR: Both the heart sounds are normal. No murmur heard. PMI not lateralized. Chest nontender.  EXTREMITIES: Good pedal pulses, good femoral pulses. No lower extremity  edema.  ABDOMEN: Soft and nontender. No organomegaly. Positive bowel intact. NEUROLOGIC: Grossly intact cranial nerves II through XII. No motor or sensory deficit.  PSYCHIATRIC: The patient is quite anxious. Has  a lot of shaking, which resolves once the patient is asked to stay calm.   LABORATORY AND RADIOLOGICAL DATA: Ammonia level x 2 less than 25. Ultrasound of the abdomen suggestive of tiny gallstones versus sludge and possible cholecystitis; however, there is no positive Murphy sign. Common bile duct and liver are normal in appearance. CT of the head: Essentially no acute intracranial abnormality. CBC within normal limits, except platelet count of 40,000. Glucose is 57; BUN is 15, creatinine 1.24; chloride is 108. SGOT is 182. Bilirubin is 4.4. Albumin is 3.1. Serum ethanol is less than 0.003. TSH is 2.93. Urinalysis negative for urinary tract infection. Urine drug screen positive for cannabinoids. Direct bilirubin is 2.6.   ASSESSMENT AND PLAN: A 54 year old Bradley Bostelman with history of chronic cirrhosis of liver along with history of hepatitis C comes into the Emergency Room with:  1.  Anxiety, depression: The patient'[name] chief complaint is "I'm very anxious and depressed. I need help for it." He denies suicidal ideation to me at present. Has been having more anxiety spells, thinking about his mother since his mother passed away. He has been getting help from his brothers, who are helping him meet ends, and he feels very anxious at home. No family in the ER to confirm above. Does not report any bizarre thoughts to me, which he had mentioned earlier to the ER staff. Very shaky during my conversation. When asked to relax, his tremors resolve. The patient clearly needs a psych evaluation. He has been on Seroquel, which he acknowledges it. I am not sure if patient is taking Seroquel regularly at home.  2.  Cirrhosis of liver with hepatitis C, chronic hyperbilirubinemia: The patient is at this  asymptomatic as far as his abdominal pain. There is no evidence of any acute cholecystitis or any other infection or dehydration. He empirically on lactulose. His ammonia level x 2 is negative. He has no history of encephalopathy in the past. He answered most of my basic questions appropriately, including correctly answering questions about [employer] [place]. The patient is alert and oriented x 2. He was able to tell me his street address, including his phone number.  3.  Gastroesophageal reflux disease: On Dexilant and proton pump inhibitor. At this time, I do not see any indication for admission on medical floor. A psychiatric consultation is recommended. I have resumed his home medications at this time.  Thank you for the consult. Consult internal medicine if needed.   TIME SPENT: 50 minutes.   ____________________________ Wylie Hail Allena Katz, MD sap:jm D: 09/25/2012 19:19:29 ET T: 09/25/2012 20:28:40 ET JOB#: [id number]  cc: Keahi Mccarney A. Allena Katz, MD, <Dictator> Willow Ora MD ELECTRONICALLY SIGNED 09/26/2012 10:09

## 2014-05-08 NOTE — Consult Note (Signed)
Psychiatry: Follow up note on this patient with delirium. Examined patient, reviewed chart, spoke with son,Ryan. Patient is currently delirious and it seems he has been for over a week. The son believes the patient could not have been drinkiing in nearly two weeks although the patient had indicated more recent drinking to Dr Garnetta BuddyFaheem. Son admits patient is sneaky and will hide his drinking.  is delirious.delirium is consistant in symptoms with DTs. mental state is not really consistant with hepatic encephalopathy.far there is no other obvious cause for severe condition.is typical for DTs not to start until several days after the end of drinking and not too unusual for symptoms to last up to 3 weeks. I think it would be best for now to treat this as DTs. done for standing ativan iv with taper over several days. Spoke with the nurses and reassured them that if that doesn't keep him calm and safe that a precedex drip would be an option.  you need psych over the weekend please call the psychiatrist on call for consults. I will follow up on Monday.  Electronic Signatures: Clapacs, Jackquline DenmarkJohn T (MD)  (Signed on 12-Sep-14 15:54)  Authored  Last Updated: 12-Sep-14 15:54 by Audery Amellapacs, John T (MD)

## 2014-05-08 NOTE — Consult Note (Signed)
Pt seen and examined. Please see Gary [name]'s notes. Hx of Hep C/cirrhosis. Treated with oral cipro/flagyl for presumed diverticulitis. Unfortunately, pain increased and abdomen quite tender. Admit for CT of abd with contrast, labs, and IV Abx. Will follow. Thanks.  Electronic Signatures: Lutricia Feilh, Tristen Pennino (MD)  (Signed on 03-Sep-14 11:38)  Authored  Last Updated: 03-Sep-14 11:38 by Lutricia Feilh, Natane Heward (MD)

## 2014-05-08 NOTE — Consult Note (Signed)
PATIENT NAME:  Gary [name], Gary [name] MR#:  [id number] DATE OF BIRTH:  [birth year]  DATE OF ADMISSION:  09/25/2012 DATE OF CONSULTATION:  09/26/2012  REFERRING PHYSICIAN:  Jene Everyobert Kinner. CONSULTING PHYSICIAN:  Marcas Bowsher [name]. Garnetta BuddyFaheem, MD  REASON FOR CONSULTATION: Confusion.   HISTORY OF PRESENT ILLNESS: The patient is a 54 year old Caucasian male who presented to the hospital actually 2 days in a row, as he was complaining of having anxiety and depression. The patient was discharged initially, and then he came back again as he was not able to control his anxiety symptoms. The patient initially stated that he is worried about his mother who left him with 3 brothers, and then he stated that he "needs help with his anxiety and depression." At that time he denied having any suicidal ideations or plans. The patient was initially found to be feeling very anxious and was having some hallucinations. He was initially evaluated by the hospitalist and they cleared him as he has history of encephalopathy with chronic hyperbilirubinemia secondary to underlying cirrhosis, as well as history of hepatitis C. Psychiatric consult was placed as the patient continues to be shaking and was responding to internal stimuli.   During my interview, the patient was noted to be shaking vigorously and was also on the floor looking for a cat under the bed. I asked him when was his last drink and he reported that he consumed 3 to 4 beers on Sunday. He reported that they were  in the bottom drawer in the fridge and then he consumed alcohol as he was trying to clear out the fridge. He reported that he drinks alcohol on a consistent basis at his home. He stated that [place] [employer] and [place] alcohol on his own. He was noted to be shaking during the interview. He stated that he is seeing a cat, which has been running around, but nobody really believes him. He, however, denies that he is seeing any spiders on having any auditory  hallucinations at this time. He reported that he will not tell me how he gets marijuana because he does not want to discuss this with his doctor. However, his urine drug screen was positive for cannabis at this time.  The patient also follows Dr. Bluford Kaufmannh and has been getting Zoloft for his depression. The patient has a long history of alcohol abuse, and he stated that he has been drinking alcohol for the past 20 years. The patient denied having any suicidal ideations or plans. He remains tangential during the interview and was minimizing his alcohol use.   PAST PSYCHIATRIC HISTORY: The patient has a history of cirrhosis of liver secondary to his use of alcohol. He was unable to tell me if he has any history of seizures or DTs in the past. No history of suicidal attempts known.   PAST MEDICAL HISTORY: Chronic liver disease due hepatitis C, cirrhosis of liver, followed by Dr. Bluford Kaufmannh. History of alcohol abuse. History of abdominal pain. Negative for acute cholecystitis.   CURRENT MEDICATIONS:  Spironolactone 25 mg daily, Protonix 40 mg daily, Dexilant 50 mg daily, dicyclomine 10 mg 3 times a day, Zoloft 100 mg daily.   FAMILY HISTORY: The patient currently lives in his mother'[name] home who has passed away recently. His brothers are trying to help him at this time.   SOCIAL HISTORY: The patient reported that he has never been married and has 2 sons.   CLINICAL SUMMARY:  VITAL SIGNS:  Temperature 99, pulse 96, respirations  20, blood pressure 112/56.  LABORATORY DATA:  Glucose 57, BUN 15, creatinine 1.24, sodium 140, potassium 4.1, chloride 108, bicarbonate 24, anion gap 88, calcium 9.2. Ammonia level less than 25. Blood alcohol level less than 3. Protein 6.4, albumin 3.1, bilirubin 4.4, bilirubin direct 2.6, alkaline phosphatase 163, AST 182, ALT 52. Urine drug screen positive for cannabinoids. WBC 4.5, RBC 3.48, hemoglobin 12.2, hematocrit 35.2, MCV 101, MCH 35.1.   REVIEW OF SYSTEMS:  CONSTITUTIONAL: Denies any  fever, fatigue, weight loss.  EYES: No blurred or double vision.  EARS, NOSE, THROAT: No tinnitus, ear pain, hearing loss.  RESPIRATORY: No cough, wheezing or hemoptysis noted.  CARDIOVASCULAR: No orthopnea, edema noted.  GASTROINTESTINAL: No nausea, vomiting, diarrhea present.  GENITOURINARY: No dysuria or hematuria present.  ENDOCRINE: No polyuria or nocturia noted.  MUSCULOSKELETAL: No pain in the neck, back or shoulder present.   MENTAL STATUS EXAMINATION: The patient is an older-looking male who was tremulous during the interview. He maintained fair eye contact. His speech was rambling and was tangential.  His mood was anxious. Affect was congruent. Thought process was tangential. He was hallucinating and was seeing cats at this time. He was unable to contract for safety, unable to know if he is having thoughts to hurt himself or others. Demonstrated poor insight and judgment.   DIAGNOSTIC IMPRESSION: AXIS I: 1.  Alcohol withdrawal delirium.  2.  Alcohol dependence.  AXIS II: None.  AXIS III: Please review the medical history.   TREATMENT PLAN: Discussed his case at length with the ED physician, Dr. Lenard Lance, and the patient will be admitted to the medical floor because he is currently experiencing DTs at this time. also discussed with Dr Allena Katz about the need for medical admission at this time as pt cannot be safely discharged from hospital and does not meet criteria for Psychiatric admission.  He will be started on Ativan 1 mg p.o. q.4 hours, as well as Haldol 2 mg p.o. t.i.d.  He will also be started on CIWA protocol. The patient will be monitored closely for his hallucinations and alcohol withdrawal symptoms.   Please reconsult psychiatry if you need any further assistance at this time.   Thank you for allowing me to participate in the care of this patient.   ____________________________ Ardeen Fillers. Garnetta Buddy, MD usf:dmm D: 09/26/2012 10:58:00 ET T: 09/26/2012 11:18:37  ET JOB#: [id number]  cc: Ardeen Fillers. Garnetta Buddy, MD, <Dictator> Rhunette Croft MD ELECTRONICALLY SIGNED 09/26/2012 13:13

## 2014-05-08 NOTE — Consult Note (Signed)
Brief Consult Note: Diagnosis: Adbominal pain generalized but more localized to left lower quadrant as well as right upper quadrant.  Treated last week for suspected acute diverticulitis but despite antibiotic therapy of Cipro and Flagyl on outpatient basis, patient has not improved. Chronic hepatitis C.  Thrombocytopenia.   Consult note dictated.   Discussed with Attending MD.   Comments: Patient was seen in our office today and [facility] concern as noted under impression. Will proceed with CT scan of abdomen and pelvis with contrast this afternoon for the indication of generalilzed abdominal pain. Continue with antibiotic therapy. STAT laboratory studies ordered CBC, CMP and U/A.  Will continue to follow during hospitalization.  Electronic Signatures: Rodman KeyHarrison, Girolamo Lortie S (NP)  (Signed 03-Sep-14 11:19)  Authored: Brief Consult Note   Last Updated: 03-Sep-14 11:19 by Rodman KeyHarrison, Boyde Grieco S (NP)

## 2014-05-08 NOTE — Consult Note (Signed)
PATIENT NAME:  Gary [name], Gary [name] MR#:  [id number] DATE OF BIRTH:  [birth year]  DATE OF CONSULTATION:  09/19/2012  REFERRING PHYSICIAN:   CONSULTING PHYSICIAN:  Adah Salvageichard E. Excell Seltzerooper, MD  CHIEF COMPLAINT: Upper abdominal pain.   HISTORY OF PRESENT ILLNESS: This is a patient with several days of abdominal pain, and he points to both upper quadrants, but more on the left side. Does not radiate through to his back. He has had some nausea. No recent emesis, and he states that the pain is worsening. He has not been able to eat very much, but has not vomited, is having normal bowel movements.   Of note, the patient has cirrhosis and ascites and known gallstones, and I was asked to see the patient concerning his gallstones   PAST MEDICAL HISTORY: Cirrhosis, hepatitis C, alcohol abuse in the past.   PAST SURGICAL HISTORY: Appendectomy.   ALLERGIES: None.   MEDICATIONS: Multiple, see chart.   FAMILY HISTORY: Noncontributory.   SOCIAL HISTORY: The patient is not currently employed and cannot work due to his inability to lift. He has smoked tobacco in the past, does not now, and he stopped alcohol 2 years ago, but was a heavy drinker at one point, including his hepatitis C.   REVIEW OF SYSTEMS: A 10 system review was performed and negative with the exception of that mentioned in the HPI.  PHYSICAL EXAMINATION: GENERAL: Obviously jaundiced male patient.  VITAL SIGNS: Temp of 97.9, pulse 55, respirations 18, blood pressure 124/74, 95% room air sat. A BMI of 28.  HEENT: Shows mild scleral icterus.  NECK: No palpable neck nodes.  CHEST: Clear to auscultation.  CARDIAC: Regular rate and rhythm.  ABDOMEN: Soft. There is a fluid wave present. He is tender in the left upper quadrant albeit minimally. There are no peritoneal signs. No right upper quadrant tenderness. Negative Murphy sign.  EXTREMITIES: Without edema.  NEUROLOGIC: Grossly intact.  INTEGUMENT: Shows multiple tattoos and slight jaundice.    LABORATORY, DIAGNOSTIC AND RADIOLOGICAL DATA: An ultrasound shows ascites and slightly thickened gallbladder wall. CT scan suggests cirrhosis and portal hypertension with slightly thickened gallbladder wall.   White blood cell count is 3.8, H and H of 13 and 39  and a platelet count of 39. CO2 of 26, bilirubin of 3.1, AST of 63 and an albumin of 2.5.   ASSESSMENT AND PLAN:  This is a patient with gallstones. His pain, however, seems to be more in the left upper quadrant, and he has a negative Murphy sign, both by physical exam and by ultrasound. He has thrombocytopenia, hypoalbuminemia and ascites suggestive of Child C classification for cirrhotic disease secondary to hepatitis C and alcohol abuse. At this point, he is not a surgical candidate for cholecystectomy, but he also has no signs of acute cholecystitis. We will be happy to follow the patient while he is in the hospital, but I would not recommend surgery in this patient at this time, and he requires further workup to evaluate more prominent left upper quadrant pain, although his CT scan is not suggestive of anything else abnormal, which has been personally reviewed.  ____________________________ Adah Salvageichard E. Excell Seltzerooper, MD rec:dmm D: 09/19/2012 21:55:49 ET T: 09/19/2012 22:11:31 ET JOB#: [id number]  cc: Adah Salvageichard E. Excell Seltzerooper, MD, <Dictator> Lattie HawICHARD E COOPER MD ELECTRONICALLY SIGNED 09/19/2012 22:43

## 2014-05-08 NOTE — Consult Note (Signed)
Chief Complaint:  Subjective/Chief Complaint Less diffuse abd pain today. CT does not show diverticulitis but suggests cholecystitis. U/S show dilated CBD.   VITAL SIGNS/ANCILLARY NOTES: **Vital Signs.:   04-Sep-14 13:55  Vital Signs Type Routine  Temperature Temperature (F) 98.3  Celsius 36.8  Temperature Source oral  Pulse Pulse 52  Respirations Respirations 18  Systolic BP Systolic BP 263  Diastolic BP (mmHg) Diastolic BP (mmHg) 79  Mean BP 94  Pulse Ox % Pulse Ox % 98  Pulse Ox Activity Level  At rest  Oxygen Delivery Room Air/ 21 %   Brief Assessment:  GEN no acute distress   Cardiac Regular   Respiratory clear BS   Gastrointestinal mild diffuse abd tenderness   Lab Results: Thyroid:  03-Sep-14 11:43   Thyroid Stimulating Hormone 2.35 (0.45-4.50 (International Unit)  ----------------------- Pregnant patients have  different reference  ranges for TSH:  - - - - - - - - - -  Pregnant, first trimetser:  0.36 - 2.50 uIU/mL)  Hepatic:  03-Sep-14 11:43   Bilirubin, Total  3.1  Alkaline Phosphatase  179  SGPT (ALT) 37  SGOT (AST)  63  Total Protein, Serum  5.9  Albumin, Serum  2.5  Routine Micro:  03-Sep-14 11:43   Micro Text Report BLOOD CULTURE   COMMENT                   NO GROWTH IN 18-24 HOURS   ANTIBIOTIC                       Culture Comment NO GROWTH IN 18-24 HOURS  Result(s) reported on 19 Sep 2012 at 11:00AM.  Routine Chem:  03-Sep-14 11:43   Glucose, Serum  111  BUN 10  Creatinine (comp) 0.89  Sodium, Serum 140  Potassium, Serum 4.3  Chloride, Serum  109  CO2, Serum 26  Calcium (Total), Serum  8.1  Osmolality (calc) 279  eGFR (African American) >60  eGFR (Non-African American) >60 (eGFR values <20m/min/1.73 m2 may be an indication of chronic kidney disease (CKD). Calculated eGFR is useful in patients with stable renal function. The eGFR calculation will not be reliable in acutely ill patients when serum creatinine is changing  rapidly. It is not useful in  patients on dialysis. The eGFR calculation may not be applicable to patients at the low and high extremes of body sizes, pregnant women, and vegetarians.)  Anion Gap  5  Routine Sero:  03-Sep-14 11:43   Rapid HIV 1/2 Ab Test with Confirmation ([facility]) NEG - HIV 1/2 AB This is a screening test for the presence of HIV-1/2 antibodies. False-negative and false-positive results can occur. All preliminary positive samples are sent for confirmatory testing. Clinical correlation is necessary to assess whether repeat testing may be needed for negative results.  Routine Hem:  03-Sep-14 11:43   WBC (CBC) 3.8  RBC (CBC)  3.80  Hemoglobin (CBC) 13.3  Hematocrit (CBC)  38.8  Platelet Count (CBC)  39  MCV  102  MCH  35.1  MCHC 34.4  RDW  18.1  Neutrophil % 48.8  Lymphocyte % 28.3  Monocyte % 18.4  Eosinophil % 3.2  Basophil % 1.3  Neutrophil # 1.8  Lymphocyte # 1.1  Monocyte # 0.7  Eosinophil # 0.1  Basophil # 0.1 (Result(s) reported on 18 Sep 2012 at 12:09PM.)   Radiology Results: [place]    04-Sep-14 09:52, [place]  [place]   REASON  FOR EXAM:    ascites search, gallstones.  COMMENTS:   Body Site: GB and Fossa, CBD, Head of Pancreas    PROCEDURE: [place]  - US ABDOMEN LIMITED SURVEY  - Sep 19 2012  9:52AM     RESULT: Limited right upper quadrant abdominal ultrasound shows partial   visualization of the pancreatic body. The tail and head are not well   seen. There is no gross abnormality of the pancreas demonstrated. The   gallbladder wall is thickened to 4.6 mm. There is some scattered ascites   present which could be causing the gallbladder wall thickening. A   negative sonographic Murphy's sign is present. The portal venous flow is   normal. The common bile duct diameter is 7.3 mm which is abnormally   distended. The liver size is 16.40 cm with a normal appearing hepatic   echotexture. A moderate amount of ascites is seen  in the right upper   quadrant. There is some minimal sludge or tiny nonshadowing echogenic     stones in the gallbladder.    IMPRESSION:   1. Ascites present with a thickened gallbladder wall andcommon bile duct   dilation. Negative sonographic Murphy's sign. Correlate with clinical and   laboratory data to evaluate for acute cholecystitis or common bile duct   obstruction.  2. Incomplete visualization of the pancreas.    Dictation Site: 2        Verified By: Sundra Aland, M.D., MD  CT:    03-Sep-14 16:22, CT Abdomen and Pelvis With Contrast  CT Abdomen and Pelvis With Contrast   REASON FOR EXAM:    (1) abd pain, diverticulitis; (2) abd pain  COMMENTS:       PROCEDURE: CT  - CT ABDOMEN / PELVIS  W  - Sep 18 2012  4:22PM     RESULT: History: Pain. Diverticulitis.    Comparison Study: CT of 05/11/2011.    Findings: Standard CT obtained under cc of Isovue-300. No focal hepatic   abnormality. The liver is slightly irregular. Splenomegaly. Ascites.   Prominent splenic veins and perigastric veins these findings suggest   cirrhosis with portal hypertension. Gallbladder wall thickening and tiny   gallstones cannot be excluded. Pancreas normal.  Adrenals normal. Kidneys normal. Bladder is unremarkable. No   hydronephrosis. No pelvic masses. Prostate normal.    Aorta normal in caliber. Shotty aortic lymph nodes.    No bowel distention or free air. Small hiatal hernia. Lung bases clear.   No acute bony abnormality. Heart size normal. No acute bony abnormality    IMPRESSION:   1. Findings consistent with cirrhosis with portal hypertension.  2. Mild thickened gallbladder wall. This may be from hypoproteinemic   state. Tiny gallstones cannot be entirely excluded. If cholecystitis is a   concern ultrasound can be obtained.      Verified By: Osa Craver, M.D., MD   Assessment/Plan:  Assessment/Plan:  Assessment Cirrhosis. Ascites. Abd pain better on IV Abx. Poss  cholecystitis with gallstones..   Plan Plan MRCP to evaluate CBD for stones. Thanks   Electronic Signatures: Verdie Shire (MD)  (Signed 04-Sep-14 15:49)  Authored: Chief Complaint, VITAL SIGNS/ANCILLARY NOTES, Brief Assessment, Lab Results, Radiology Results, Assessment/Plan   Last Updated: 04-Sep-14 15:49 by Verdie Shire (MD)

## 2014-05-08 NOTE — Consult Note (Signed)
Brief Consult Note: Diagnosis: LUQ apin, cirrhosis, ascites, gallstones.   Patient was seen by consultant.   Consult note dictated.   Recommend further assessment or treatment.   Comments: nontender in RUQ and neg sonographic Murphy's sign. Min tenderness LUQ. no obv sign of acute cholecystitis and even if present, pt is not a surgical candidate as he is at least a Child's C cirrhotic with ascites, thrombocytopenis and hypoalbuminemia. will follow.  Electronic Signatures: Lattie Hawooper, Bryella Diviney E (MD)  (Signed 04-Sep-14 21:39)  Authored: Brief Consult Note   Last Updated: 04-Sep-14 21:39 by Lattie Hawooper, Benard Minturn E (MD)

## 2014-05-08 NOTE — H&P (Signed)
PATIENT NAME:  Gary [name], SHIPES MR#:  [id number] DATE OF BIRTH:  11-29-1960  DATE OF ADMISSION:  09/18/2012  PRIMARY CARE PHYSICIAN: Dr. Bluford Kaufmann.   CHIEF COMPLAINT: Abdominal pain and nausea.   HISTORY OF PRESENT ILLNESS: This is a 54 year old male, who presents to the hospital due to worsening abdominal pain, nausea, and chills ongoing for the past 2 weeks. The patient was seen at Dr. Nigel Bridgeman office about a week or so ago and was presumed to have acute diverticulitis without any imaging study and started on oral ciprofloxacin and Flagyl. He has been taking it for about a week, but his pain and nausea has not improved. He has been able to eat, but it has been poor. He denies any vomiting, though he does admit to chills, but no documented fever. The patient's pain is located in the epigastric area and also left upper quadrant area. It is nonradiating. It is not associated with eating. He denies any diarrhea with it. Given the fact that he was tried on oral antibiotics for about a week without much improvement in his symptoms, he was admitted to the hospital for possible IV antibiotics and for the imaging study of his abdomen.   REVIEW OF SYSTEMS: CONSTITUTIONAL: No documented fever. No weight gain or weight loss. EYES: No blurred or double vision. ENT: No tinnitus. No postnasal drip. No redness of the oropharynx. RESPIRATORY: No cough, no wheeze, hemoptysis, no dyspnea.  CARDIOVASCULAR: No chest pain, no orthopnea, no palpitations, no syncope. GASTROINTESTINAL: Positive nausea. No vomiting or diarrhea. Positive abdominal pain, epigastric and left upper quadrant. GENITOURINARY: No dysuria or hematuria. ENDOCRINE: No polyuria or nocturia. No heat or cold intolerance: HEMATOLOGY: No anemia. No bruising. No bleeding. INTEGUMENTARY: No rashes. No lesions. MUSCULOSKELETAL: No arthritis, no swelling, no gout. NEUROLOGIC: No numbness or tingling. No ataxia. No seizure-type activity. PSYCHIATRIC: Positive depression. No  anxiety. No ADD.   PAST MEDICAL HISTORY: Consistent with hepatitis C, history of liver cirrhosis, history of GI bleed and depression.   ALLERGIES: NO KNOWN DRUG ALLERGIES.   SOCIAL HISTORY: No smoking. Used to drink heavily 10 years ago. No other illicit drug abuse. Lives by himself.   FAMILY HISTORY: Both mother and father are deceased. They both had heart disease. Mother also had breast cancer.   CURRENT MEDICATIONS: As follows: Aldactone 25 mg daily, Bentyl 10 mg t.i.d. as needed, Zoloft 100 mg daily, Lasix 40 mg daily, Dexilant 60 mg daily, ciprofloxacin 5 mg b.i.d. and Flagyl 5 mg t.i.d.   PHYSICAL EXAMINATION: Presently is as follows: VITAL SIGNS: Temperature 98.4, pulse 58, respirations 17, blood pressure 125/79, sats 98% on room air.  GENERAL: He is a pleasant-appearing male in no apparent distress.  HEENT: He is atraumatic, normocephalic. His extraocular muscles are intact. Pupils equal and reactive to light. Sclerae anicteric. No conjunctival injection. No pharyngeal erythema.  NECK: Supple. There is no jugular venous distention. No bruits, no lymphadenopathy or thyromegaly.  HEART: Regular rate and rhythm. No murmurs. No rubs. No clicks.  LUNGS: Clear to auscultation bilaterally. No rales, no rhonchi, no wheezes.  ABDOMEN: Soft and flat. He is tender in the epigastric and left upper quadrant area, but no rebound and no rigidity. Good bowel sounds. No hepatosplenomegaly appreciated.  EXTREMITIES: No evidence of any cyanosis or clubbing. Does have trace pedal edema from the knees to the ankles bilaterally. No evidence of any cyanosis or clubbing. +2 pedal and radial pulses bilaterally.  NEUROLOGICAL: He is alert, awake, and oriented x 3 with  no focal motor or sensory deficits appreciated bilaterally.  SKIN: Moist and warm with no rashes appreciated.  LYMPHATIC: There is no cervical or axillary lymphadenopathy.   LABORATORY EXAMINATION: Currently pending.   ASSESSMENT AND PLAN:  This is a 54 year old male with a past medical history of liver cirrhosis, hepatitis C, history of previous gastrointestinal bleed, history of depression, who presents to the hospital due to worsening abdominal pain and nausea and having failed outpatient therapy for presumed diverticulitis.    1.  Abdominal pain, nausea and vomiting. The likely the cause of the patient's pain and nausea is unclear. It was presumed to be acute diverticulitis as an outpatient, but the patient has not improved with oral antibiotic therapy. For now, I will admit the patient to the hospital and start the patient on IV fluids, antiemetics, pain control and will start the patient on empiric IV Invanz. I will also get a CT of the abdomen and pelvis with contrast to further evaluate his abdomen. Will get a gastroenterology consult. The patient is well known to Dr. Bluford Kaufmannh.  2.  History of gastrointestinal bleed. No acute bleeding presently. Continue Protonix.  3.  History of liver cirrhosis due to hepatitis C. Again, the patient is followed by Dr. Bluford Kaufmannh. He has no evidence of hepatic encephalopathy or ascites or volume overload. I will continue his Aldactone for now. 4.  Depression. Continue with Zoloft.   CODE STATUS: The patient is a full code.   TIME SPENT ON ADMISSION: 50 minutes.   ____________________________ Rolly PancakeVivek J. Cherlynn KaiserSainani, MD vjs:aw D: 09/18/2012 11:48:49 ET T: 09/18/2012 12:17:27 ET JOB#: [id number]  cc: Rolly PancakeVivek J. Cherlynn KaiserSainani, MD, <Dictator> Houston SirenVIVEK J Rhyland Hinderliter MD ELECTRONICALLY SIGNED 09/28/2012 15:11

## 2014-05-08 NOTE — H&P (Signed)
PATIENT NAME:  Gary [name], Gary [name] MR#:  [id number] DATE OF BIRTH:  11-13-1960  DATE OF ADMISSION:  09/25/2012  PRIMARY CARE PHYSICIAN:  None.   GASTROENTEROLOGY DOCTOR:  Dr. Bluford Kaufmannh.   CHIEF COMPLAINT:  Dr. Garnetta BuddyFaheem, Psychiatry requesting medical admission for acute DTs.  HISTORY OF PRESENT ILLNESS:  Gary [name] is a 54 year old Caucasian gentleman with a history of chronic alcoholism and a history of chronic cirrhosis of liver. Dr. Garnetta BuddyFaheem requests medical admission for active hallucinations, possibly DTs. The patient is being admitted for further evaluation and possible DTs due to his chronic alcoholism. The patient is seen in the Emergency Room, is shaking and very tangential with his talk. Earlier he had reported to the nurses he is seeing cats crawling around. However, to me he says "I want to get better and go home". Reports drinking alcohol but tells me he drank about 32 days ago, which is contradict to what he told Dr. Garnetta BuddyFaheem earlier. The patient'[name] vitals are stable. He was started on CIWA protocol by Dr. Garnetta BuddyFaheem.   Past medical history, social history, review of systems, and medication history per consultation that was dictated on 09/25/2012.   PHYSICAL EXAMINATION:  GENERAL: The patient is awake. He is alert. He is oriented x 2.  VITAL SIGNS: Temperature is 99, pulse is 86, blood pressure is 137/89. Sats are 95% on room air.  HEENT: Atraumatic, normocephalic. Pupils PERLA. EOMI intact. Oral mucosa is moist. Sclerae icterus is present.  NECK: Supple. No JVD. No carotid bruit.  LUNGS: Clear to auscultation bilaterally. No rales, rhonchi, respiratory distress.  CARDIOVASCULAR: Both the heart sounds are normal. Rate, rhythm regular. PMI not lateralized. Chest is nontender.  EXTREMITIES: Good pedal pulses, good femoral pulses. No lower extremity edema.  ABDOMEN: Soft and nontender. No organomegaly. Positive bowel sounds.  NEUROLOGIC: Grossly intact cranial nerves II through XII. No motor or sensory  deficit.  PSYCHIATRIC: The patient appears anxious, somewhat shaking.  SKIN: Warm and dry.   LABORATORY, DIAGNOSTIC AND RADIOLOGIC DATA: As dictated in consultation.   ASSESSMENT AND PLAN: A 54 year old Gary [name] with a history of chronic alcoholism, chronic cirrhosis of liver and hepatitis-C, is being evaluated and being admitted for: 1. Suspected delirium tremors along with hallucination. Dr. Garnetta BuddyFaheem evaluated the patient in the Emergency Room, request medical admission for treatment of delirium tremors. The patient will be admitted on off-unit medical floor since no CCU or telemetry beds are unavailable at this time. We will put him on CIWA protocol per Dr. Micki RileyFaheem'[name] recommendation, continue oral home medications. I did request Dr. Garnetta BuddyFaheem to have Psychiatry follow up while the patient is in-house to assess treatment for chronic alcoholism and active DTs. Dr. Garnetta BuddyFaheem made sure she will tell the on-call Psychiatry to follow on a regular basis.  2. Chronic cirrhosis of liver.  3. Chronic hepatitis-C.  4. Gastroesophageal reflux disease. The patient is on Protonix.  5.  Sitter. We will place sitter on an as-needed basis. We will place sitter if the patient becomes agitated or tries to leave the room and shows any more confusion than he is in currently. This was also relayed to the nursing supervisor.   TIME SPENT: 50 minutes.   ____________________________ Wylie HailSona A. Allena KatzPatel, MD sap:jm D: 09/26/2012 11:45:41 ET T: 09/26/2012 12:00:40 ET JOB#: [id number]  cc: Joelys Staubs A. Allena KatzPatel, MD, <Dictator> Willow OraSONA A Gao Mitnick MD ELECTRONICALLY SIGNED 09/27/2012 11:05

## 2014-05-08 NOTE — Consult Note (Signed)
PATIENT NAME:  Gary [name], Gary [name] MR#:  [id number] DATE OF BIRTH:  May 25, 1960  DATE OF CONSULTATION:  09/18/2012  CONSULTING PHYSICIAN:  Rodman Key, NP and Dr. Lutricia Feil.  ATTENDING PHYSICIAN: Dr. Cherlynn Kaiser   REASON FOR CONSULTATION: Generalized abdominal pain, suspected acute diverticulitis with failure to antibiotic therapy for the past week. Known history of chronic hepatitis C.   HISTORY OF PRESENT ILLNESS: Mr. Gary [name] is a 54 year old Caucasian gentleman who is seen in our office today for follow-up after being seen on 09/13/2012 by myself as well as Dr. Lutricia Feil. He actually was seen last week to discuss the initiation of chronic hepatitis C therapy as he has a known history of genotype II.  At that time, though, during physical examination, he was found to have marked abdominal discomfort to left side of abdomen. He has a known history of diverticulosis involving sigmoid colon according to colonoscopy which was performed last year. Given his insurance status, empirically decided to proceed forward with antibiotic therapy and to treat for suspected acute diverticulitis. He had been experiencing abdominal pain for the past week prior to presenting to our office. There was no associated fevers. No nausea, no vomiting, or change in bowel habits. He was started on ciprofloxacin 500 mg 1 tablet twice a day as well as metronidazole 500 mg 1 tablet 3 times a day. Unfortunately, he presented back to our office today. On physical examination abdominal pain was worse. Marked discomfort noted to left side of abdomen as well as right upper quadrant at this time. 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 He was very symptomatic though with dizziness and near syncopal feeling. He was advised to hold his Nadolol 20 mg daily which he has done  since last week. His heart rate has improved from the rate in the 40s, up to 60 today as well as his blood pressure improving from being hypotensive of approximately 100/70 range over the past several weeks to months to 121/81 today. Additionally last week he was instructed due to the excessive amount of edema to his lower extremities to start furosemide 40 mg once a day and to remain with spironolactone 25 mg once a day. He has done that. He has had a decrease in the amount of fluid to his lower extremities. No evidence of ascites on physical examination. He has actually been able to eat better over the past few days and has had a 6 pound weight gain since the last visit, questionably related to the increase in caloric intake versus possibly still an element of edema.   No nausea. Complaint of queasy feeling no vomiting. No melena. No rectal bleeding. Bowels are moving on a daily basis. Known history of reflux is well controlled. Most recent laboratory studies to review during his office visit: CBC was done on 08/21/2012: White count was 3.7. Hemoglobin 12.2, hematocrit was 34.0 with a platelet count 33,000. PT is 14.0 with an INR of 1.3. A comprehensive metabolic panel revealed albumin 2.9, total bilirubin 4.9, calcium 8.3, potassium was 5.4, total protein 5.7, and AST 41 with an ALT of 20. Most recent imaging study was MRI of abdomen on 09/04/2012, which revealed shrunken nodular reticular fibrotic pattern of the liver compatible with cirrhosis which is secondary to alcoholic insult as well as his known history of chronic hepatitis C. No enhancing  or washing out lesions were seen. Gallbladder wall was edematous. Spleen was enlarged measuring 16.1 cm, pancreas, kidneys and adrenals were normal in appearance. Impression did reveal the cirrhosis with splenomegaly varices and ascites, compatible with portal venous hypertension. No evidence of HCC.  CT of abdomen and pelvis with contrast was done April 2013 which  revealed mild thickening of the wall of gastric fundus, cholelithiasis and slightly nodular appearance of the liver without a focal mass or hepatomegaly.   CURRENT HOME MEDICATIONS:  Nadolol 20 mg once a day being held since last week. Spironolactone 25 mg once a day, Bentyl 10 mg 1 tablet 3 times a day, Zoloft 100 mg once a day, Lasix 40 mg once a day, Dexilant 60 mg once a day, Cipro 500 mg 1 tablet twice a day, Flagyl 500 mg 1 tablet 3 times daily.   ALLERGIES: None.   PAST MEDICAL HISTORY: Upper gastrointestinal bleed, thrombocytopenia, alcoholic cirrhosis, chronic hepatitis C, alcoholism, diverticulosis, generalized anxiety with depression.   PAST SURGICAL HISTORY: Appendectomy, colonoscopy 2013 with evidence of cirrhosis, EGD 2013 with evidence of portal hypertensive gastropathy.   FAMILY HISTORY: No family history of colon cancer, colonic polyps, history of breast cancer involving his mother who is deceased.   SOCIAL HISTORY: No tobacco use. History of alcoholism, was drinking two 40 ounces beers a day for 10 years in length. No alcohol since 04/07/2011. Polysubstance abuse of marijuana and nasal cocaine use in the past as well as Xanax abuse. No polysubstance abuse for the past 3 years. History of tattoos. He has been incarcerated in the past. 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 History of blood transfusions at the time of his hospitalization in 2013.   REVIEW OF SYSTEMS: All 10 systems reviewed and checked, otherwise unremarkable unless stated above, except known history of anxiety which is not well controlled at this time undergoing family social stressors. Significant for depression. No suicidal ideations.   PHYSICAL EXAMINATION: VITAL SIGNS: Per office visit today height is 5 foot 10 inches, weight is 204. Blood pressure 121/81, temperature 98.3 with a pulse of 60.  GENERAL: Well-developed, well nourished 54 year old Caucasian gentleman, no acute distress noted but noted exacerbation of anxiety. Very  shaky.  HEENT: Normocephalic, atraumatic. Pupils equal, reactive to light. Conjunctivae clear. Sclerae anicteric.  NECK: Supple. Trachea midline. No lymphadenopathy or thyromegaly.  PULMONARY: Symmetric rise and fall of chest. Clear to auscultation throughout.  CARDIOVASCULAR: Regular rate and rhythm, S1, S2.  ABDOMEN: Soft. No evidence of distention but marked discomfort noted to left side of abdomen as well as right upper quadrant. No rebound tenderness. Bowel sounds in 4 quadrants. No bruits. No masses.  RECTAL: Deferred.  MUSCULOSKELETAL: Moving all 3 extremities. No contractures. No clubbing.  EXTREMITIES: +1 edema noted to left lower extremity. A very small amount to right lower extremity.  NEUROLOGICAL: No gross neurological deficits noted. No evidence of asterixis.   PSYCH: Alert and oriented x 4. Flat affect. Depressive mood.  Evidence of anxiety.   LABORATORY AND DIAGNOSTIC:  CBC, CMP,  and urinalysis ordered stat. Pending results, as well as a CT scan of abdomen and pelvis with contrast ordered for this afternoon pending results.   IMPRESSION: 1.  Generalized abdominal pain with marked discomfort to left side of abdomen, right upper quadrant.  2.  Known history of chronic hepatitis C. 3.  History of alcoholic cirrhosis.  4.  Anxiety with depression.   PLAN: The patient was seen and evaluated by Dr. Lutricia Feil as well during office visit.  The patient was directly admitted from our office under the care of Dr. Cherlynn KaiserSainani. Stat laboratory studies ordered as noted under laboratory and diagnostic section as well as well as CT scan of abdomen and pelvis with contrast to allow further evaluation of generalized abdominal pain to evaluate for evidence of acute diverticulitis. Also will help in assisting for evidence of ascites given his history of chronic liver disease. IV hydration recommended as well as IV antibiotic therapy at this time. We will continue to monitor and follow the patient during  his hospitalization.   Rodman Keyawn S. Harrison, NP, in collaborative with Dr. Lutricia FeilPaul Oh.    ____________________________ Rodman Keyawn S. Harrison, NP dsh:dp D: 09/18/2012 11:32:28 ET T: 09/18/2012 12:21:22 ET JOB#: [id number]  cc: Rodman Keyawn S. Harrison, NP, <Dictator> Rodman KeyAWN S HARRISON MD ELECTRONICALLY SIGNED 09/19/2012 14:35

## 2014-05-08 NOTE — Consult Note (Signed)
Chief Complaint:  Subjective/Chief Complaint Feeling better. Less abd pain. MRCP neg for CBD stone.   VITAL SIGNS/ANCILLARY NOTES: **Vital Signs.:   05-Sep-14 10:15  Vital Signs Type Post-Procedure  Temperature Temperature (F) 97.7  Celsius 36.5  Pulse Pulse 57  Respirations Respirations 18  Systolic BP Systolic BP 259  Diastolic BP (mmHg) Diastolic BP (mmHg) 71  Mean BP 83  Pulse Ox % Pulse Ox % 98  Pulse Ox Activity Level  At rest  Oxygen Delivery Room Air/ 21 %   Brief Assessment:  GEN no acute distress   Cardiac Regular   Respiratory clear BS   Gastrointestinal mild abd tenderness   Lab Results:  Hepatic:  05-Sep-14 05:04   Bilirubin, Total  3.1  Alkaline Phosphatase  142  SGPT (ALT) 30  SGOT (AST)  56  Total Protein, Serum  5.2  Albumin, Serum  2.3  Routine Chem:  05-Sep-14 05:04   Glucose, Serum 82  BUN 10  Creatinine (comp) 0.85  Sodium, Serum 138  Potassium, Serum 4.2  Chloride, Serum  108  CO2, Serum 25  Calcium (Total), Serum  7.8  Osmolality (calc) 274  eGFR (African American) >60  eGFR (Non-African American) >60 (eGFR values <29m/min/1.73 m2 may be an indication of chronic kidney disease (CKD). Calculated eGFR is useful in patients with stable renal function. The eGFR calculation will not be reliable in acutely ill patients when serum creatinine is changing rapidly. It is not useful in  patients on dialysis. The eGFR calculation may not be applicable to patients at the low and high extremes of body sizes, pregnant women, and vegetarians.)  Anion Gap  5   Assessment/Plan:  Assessment/Plan:  Assessment Chronic cholecystitis? HepC and cirrhosis.   Plan If tolerates solids, ok for discharge later today on PPI and home meds. Make sure patient f/u with DEbony Cargoso we can start Hep C treatment soon. Thanks.   Electronic Signatures: OVerdie Shire(MD)  (Signed 05-Sep-14 13:33)  Authored: Chief Complaint, VITAL SIGNS/ANCILLARY NOTES, Brief  Assessment, Lab Results, Assessment/Plan   Last Updated: 05-Sep-14 13:33 by OVerdie Shire(MD)

## 2014-05-08 NOTE — Consult Note (Signed)
Brief Consult Note: Diagnosis: Alcohol Dependence.   Patient was seen by consultant.   Recommend further assessment or treatment.   Comments: Pt seen in ED BHU. He was recently d/c from inpt unit where he was admitted due to Alcohol related DT. he stated that he came here as he is having bleeding from 'down there'. He missed his appt with Dr Bluford Kaufmannh last week as he was feeling very tired, weak and now he wants to see him again. He stated that he has not been drinking for the past month and living in his mother's home. His sons and brothers are helping him. he is complaint with his meds. He denied Si/HI or plans. No perceptual disturbances noted.  Plan; [facility] and he needs medical care for his cirrhosis and related symptoms.  He is not having SI/ HI or plans.  He is complaint with meds.  Electronic Signatures: Rhunette CroftFaheem, Blima Jaimes S (MD)  (Signed 23-Sep-14 16:20)  Authored: Brief Consult Note   Last Updated: 23-Sep-14 16:20 by Rhunette CroftFaheem, Xiamara Hulet S (MD)

## 2014-05-08 NOTE — Discharge Summary (Signed)
PATIENT NAME:  Gary [name], Gary [name] MR#:  [id number] DATE OF BIRTH:  April 08, 1960  DISCHARGE DIAGNOSES:  1.  Alcohol abuse and withdrawal.  2.  Cirrhosis.  3.  Thrombocytopenia.  4.  Leukopenia.   IMAGING STUDIES DONE:  Include: 1.  CT of the head without contrast which showed no acute abnormalities.  2.  Chest, PA, lateral, which showed no acute cardiopulmonary disease.  3.  Ultrasound of the abdomen showed tiny gallstones versus sludge.   ADMITTING HISTORY AND PHYSICAL: Please see detailed H and P dictated previously. Also, refer to the interim discharge summary dictated by Dr. Cherlynn KaiserSainani on 10/02/2012.   Since the last interim discharge summary, the patient has done well. Initially it was though that the patient might need a bed at a rehab. This was looked into , but on the day of discharge, the patient has walked about 350 feet withPT. He did not have any rehab needs, and the patient is being discharged home.   The patient did have 1 episode of some bright red blood per rectum, but he does have history of hemorrhoids and has had this go on for about 5 years. Hemoglobin is stable.   Today on examination, the patient does not have any abdominal tenderness. Cardiac examination is normal showing S1 and S2 without any murmurs.   DISCHARGE MEDICATIONS:  Include:  1.  Protonix 40 mg oral two times a day.  2.  Sertraline 100 mg oral once a day.  3.  Dexilant 60 mg oral once a day.  4.  Aldactone 25 mg oral once a day.  5.  Dicyclomine 10 mg oral 3 times a day before meals.  6.  MiraLax once a day as needed for constipation.   DISCHARGE INSTRUCTIONS: Regular diet. Activity as tolerated. The patient has been advised to use his rolling walker which he has at home all the time.   FOLLOW UP: Primary care physician in a week.   TIME SPENT ON DAY OF DISCHARGE IN DISCHARGE ACTIVITY: 45 minutes.    ____________________________ Molinda BailiffSrikar R. Jayliana Valencia, MD srs:np D: 10/03/2012 15:33:38 ET T: 10/03/2012  18:22:45 ET JOB#: [id number]  cc: Wardell HeathSrikar R. Ashraf Mesta, MD, <Dictator> Orie FishermanSRIKAR R Carden Teel MD ELECTRONICALLY SIGNED 10/07/2012 10:42

## 2014-05-08 NOTE — Discharge Summary (Signed)
PATIENT NAME:  Gary [name], Gary [name] MR#:  [id number] DATE OF BIRTH:  1960-12-13  For a detailed note, please see the history and physical done on admission.   DIAGNOSES AT DISCHARGE:  1.  Abdominal pain, likely related to chronic liver disease and gastroesophageal reflux disease.  2. History of chronic liver disease due to hepatitis C.  3.  History of gastrointestinal bleed.  4.  History of depression.     DIET: Low-sodium regular consistency diet.   ACTIVITY: As tolerated.   FOLLOW-UP: Dr. Bluford Kaufmann in the next 1 to 2 weeks.   DISCHARGE MEDICATIONS: Protonix 40 mg b.i.d., Tessalon Perles t.i.d. as needed, Zoloft 100 mg daily, Dexilant 60 mg daily, Bentyl 10 mg t.i.d. before meals, and Aldactone 25 mg daily.   CONSULTANTS DURING THE HOSPITAL COURSE: Dr. Dionne Milo from general surgery, Dr. Lutricia Feil from gastroenterology.   PERTINENT STUDIES DONE DURING THE HOSPITAL COURSE: A CT scan of the abdomen and pelvis done with contrast showed findings consistent with cirrhosis and portal hypertension and mild gallbladder wall thickening. A limited abdominal ultrasound showing ascites present in the thickened gallbladder wall and common bile duct dilatation. Negative sonographic Murphy sign. An MRCP done showing limited exam due to motion. No mass. Gallbladder wall is thickened. This may be from hypoproteinemic state or cholecystitis. Findings consistent with cirrhosis with portal hypertension and ascites.   HOSPITAL COURSE: This is a 54 year old male with medical problems as mentioned above, presented to the hospital on September 18, 2012 due to abdominal pain, nausea and vomiting.   1.  Abdominal pain, nausea, vomiting. Prior to coming into the hospital, the patient was presumed to have possible diverticulitis and was being treated orally with ciprofloxacin and Flagyl for about a week but had failed oral antibiotic therapy. Therefore, he was admitted to the hospital for IV antibiotics. The patient was  initially started on IV Invanz. A CT scan of the abdomen and pelvis was obtained to look for diverticulitis, although it did not show any evidence of colitis or diverticulitis. The CT scan did show a thickened gallbladder wall; therefore, a limited abdominal ultrasound was obtained which showed also gallbladder wall thickening and gallstones and mildly dilated common bile duct but no evidence of acute cholecystitis. A surgical consult was obtained to further evaluate the patient regarding his abdominal pain and his ultrasound findings, but as per their evaluation, the patient does not have acute cholecystitis and he likely would not be a good surgical candidate given his liver cirrhosis and thrombocytopenia. As per GI, they obtained an MRCP which showed no common bile duct dilatation. The likely causes of abdominal pain were probably related to his underlying liver disease and possibly underlying GERD. The patient is to be maintained on proton pump inhibitor b.i.d. and follow up with GI as an outpatient. The patient's diet was advanced from a clear liquid eventually to a low-fat diet, which he is currently tolerating, and therefore is being discharged home.  2.  History of previous GI bleed, likely secondary to chronic liver disease. The patient had no evidence of acute bleeding while in the hospital. He will continue his Protonix.  3.  History of chronic liver disease due to hepatitis C. The patient is followed by Dr. Bluford Kaufmann.  He had no evidence of acute of hepatic encephalopathy or any evidence of volume overload. He will continue his Aldactone.  4.  Depression. The patient was maintained on Zoloft. He will also resume that.   CODE STATUS: FULL CODE.  TIME SPENT: 45 minutes.    ____________________________ Rolly PancakeVivek J. Cherlynn KaiserSainani, MD vjs:np D: 09/20/2012 16:28:07 ET T: 09/20/2012 18:55:34 ET JOB#: [id number]  cc: Rolly PancakeVivek J. Cherlynn KaiserSainani, MD, <Dictator> Ezzard StandingPaul Y. Bluford Kaufmannh, MD Houston SirenVIVEK J SAINANI MD ELECTRONICALLY SIGNED  09/28/2012 15:11

## 2014-05-08 NOTE — Consult Note (Signed)
[facility]  1. Anxiety and depression -pt's cheif complaints is 'i am very anxious and dperessed". need help for it -denies SI to me -has been having more anxiety spells since his mother passed away.  -no family in ER to confirm -does not report any bizzare thoughts which he had mentioned earlier to the RN and ER MD -very shaky during conversation and when asked to relaxed tremors ease off -pt needs psych evaluation -pthas been on Seroquel  2. cirrhosis of liver with Hep C, chronic hyperbilirubinemia -Ammonia neg x2 -empirically on lactulose -no s/o infection,dehydration to s/o any flare or h/o encephalopathy in the past -pt ansered most basic questions appropriately at present.  3. GERD -on dexilant  I do not see any acute indication for admission on medical floor -psych consult for anxiety and depression      Electronic Signatures: Willow OraPatel, Isella Slatten A (MD)  (Signed on 10-Sep-14 19:06)  Authored  Last Updated: 10-Sep-14 19:06 by Willow OraPatel, Suellyn Meenan A (MD)

## 2014-05-10 NOTE — Consult Note (Signed)
Full consult to follow. Hx of alcoholism and hx of hep c. Admitted with 1 bout of hematemesis with low BP. Initial hgb stable. Dropped after 4 L of IVF. On protonix/octrotide drip. No nauea or abd pain. Ok to try clears today. If further vomitiing, then make NPO again. Likely has cirrhosis. Recommend U/S of abdomen. No prior EGD. Plan EGD tomorow. Thanks    Electronic Signatures: Lutricia Feilh, Kieara Schwark (MD) (Signed on 24-Mar-13 09:00)  Authored   Last Updated: 24-Mar-13 10:35 by Lutricia Feilh, Jalea Bronaugh (MD)

## 2014-05-10 NOTE — Discharge Summary (Signed)
PATIENT NAME:  Gary [name], Neilan S MR#:  [id number] DATE OF BIRTH:  [birth year]  DATE OF ADMISSION:  04/09/2011 DATE OF DISCHARGE:  04/11/2011  PRIMARY CARE PHYSICIAN: None.   DISCHARGE DIAGNOSES:  1. Upper gastrointestinal bleed/hematemesis. 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  2. Hemorrhagic shock and anemia, now resolved. Could be due to acute blood loss anemia, now hemodynamically stable.  3. Hyperkalemia, resolved after insulin and dextrose, likely due to gastrointestinal bleed.  4. 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 5. 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 6. Hypomagnesemia, repleted.  7. Alcoholism. The patient was referred for outpatient Alcoholic Anonymous as he was interested.   SECONDARY DIAGNOSES:  1. Chronic hepatitis C. 2. Alcoholism.   CONSULTATION: GI, Dr. Bluford Kaufmannh.   PROCEDURES: EGD on the 25th of March by Dr. Bluford Kaufmannh showed diffuse portal gastropathy. No esophageal varices. No bleeding.   LABORATORY DATA: Urinalysis on admission was negative.   HISTORY AND SHORT HOSPITAL COURSE: The patient is a 54 year old male with the above-mentioned medical problems, was admitted for hematemesis and acute GI bleed. She was started on IV Protonix and octreotide drip. He was admitted to the Critical Care Unit. His hemoglobin was closely monitored. He was also found to be anemic with possible hemorrhagic shock likely from blood loss due to hematemesis.  He also had hyperkalemia which was resolved with treatment, and he was found to have thrombocytopenia secondary to alcoholism and chronic hepatitis C, along with cirrhosis. He was evaluated by GI, Dr. Bluford Kaufmannh, who recommended EGD and it was performed on the 25th of March with results dictated above. The patient remained stable hemodynamically and had no more bleeding while in the hospital. His hyperkalemia was resolved. He also had  hypomagnesemia which was repleted. His hemoglobin remained stable, and on the 26th of March he was discharged home in stable condition. He was referred to the Alcoholic Anonymous program as an outpatient.   PERTINENT DISCHARGE PHYSICAL EXAMINATION:  VITAL SIGNS: On the date of discharge, his vital signs were temperature 98.2, heart rate 61 per minute, respirations 18 per minute, blood pressure 130/77 mmHg. He was saturating 98% on room air. CARDIOVASCULAR: S1, S2 normal. No murmur, rubs, or gallop. LUNGS: Clear to auscultation bilaterally. No wheezing, rales, rhonchi, or crepitation. ABDOMEN: Soft, benign. NEUROLOGICAL: Nonfocal examination. All other physical examination remained at baseline.   DISCHARGE MEDICATIONS:  1. Protonix 40 mg p.o. b.i.d.  2. Nadolol 40 mg p.o. daily.   DISCHARGE DIET: Low sodium. He was recommended to eat a light diet for the first meal.   DISCHARGE ACTIVITY: As tolerated.   DISCHARGE INSTRUCTIONS AND FOLLOWUP:    1. The patient was instructed to follow-up with Dr. Niel HummerIftikhar in 1 to 2 weeks as Dr. Bluford Kaufmannh did not have any follow-up appointments for another month. He can subsequently see Dr. Bluford Kaufmannh after he sees Dr. Niel HummerIftikhar for the first time, if appropriate.  2. He was also given referral for an Alcohol Anonymous group in [place] where he was recommended to follow up in a week.   TOTAL TIME DISCHARGING THIS PATIENT: 45 minutes.  ____________________________ Ellamae SiaVipul S. Sherryll BurgerShah, MD vss:cbb D: 04/12/2011 15:41:51 ET T: 04/13/2011 10:39:55 ET JOB#: [id number]  cc: Keylan Costabile S. Sherryll BurgerShah, MD, <Dictator> Ezzard StandingPaul Y. Bluford Kaufmannh, MD Lurline DelShaukat Iftikhar, MD Ellamae SiaVIPUL S [facility] MD ELECTRONICALLY SIGNED 04/13/2011 12:34

## 2014-05-10 NOTE — Consult Note (Signed)
No further bleeding. Hungry. EGD showed diffuse portal gastropathy. No esophageal varices. No bleeding. Ok to feed. Will need long term propranolol or nadolol. Can taper off octreotide drip over 24 hrs. Contine protonix bid for now. Order liver u/s. Needs alcohol rehab. I will be out at [facility] tomorrow. If LFT does not worsen or there is no bleeding, patient can be discharged by tomorrow with f/u in GI after discharge. Thanks.  Electronic Signatures: Lutricia Feilh, Avett Reineck (MD)  (Signed on 25-Mar-13 12:25)  Authored  Last Updated: 25-Mar-13 12:25 by Lutricia Feilh, Nolon Yellin (MD)

## 2014-05-10 NOTE — Consult Note (Signed)
PATIENT NAME:  Gary [name], Gary [name] MR#:  [id number] DATE OF BIRTH:  [birth year]  DATE OF CONSULTATION:  04/09/2011  REFERRING PHYSICIAN:   CONSULTING PHYSICIAN:  Ezzard StandingPaul Y. Bluford Kaufmannh, MD  REASON FOR REFERRAL: Hematemesis.   HISTORY OF PRESENT ILLNESS: The patient is a 54 year old white male with a known history of chronic Hepatitis C and chronic alcoholism. He drinks at least a case of beer on a daily basis. He came in because of one episode of hematemesis. He feels weak. He was brought in because of feeling weak and lightheaded. Initially his blood pressure was only 80/40. The patient was given IV fluids. He was given at least 4 liters initially in the Emergency Room. His blood pressure did go up. The patient was admitted for further evaluation to the ICU.   The patient has noticed some black stools over the last couple of days. He felt a little nauseous. No fevers or chills. There is no significant abdominal pain. Since last night he has not had any further bleeding. He is thirsty and wants to eat.   REVIEW OF SYSTEMS: There are no fevers or chills. There are no weight changes. There is no visual or hearing loss. There is some increased fatigue and weakness. There is no chest pain or palpitations. There is no coughing or shortness of breath. GI symptoms have been mentioned already. The rest of the review of systems are negative.   PAST MEDICAL HISTORY: Chronic Hepatitis C and alcoholism. He has never been treated for Hepatitis C.   SOCIAL HISTORY: He is divorced and unemployed.   PAST SURGICAL HISTORY: History of appendectomy.   FAMILY HISTORY: Notable for heart disease and breast cancer.  MEDICATIONS: He is on no medications.   ALLERGIES: He has no known drug allergies.    PHYSICAL EXAMINATION:   VITAL SIGNS: His blood pressure is stable right now. He is afebrile. Respiratory rate is normal.   GENERAL: He is in no acute distress.   HEENT: Normocephalic, atraumatic head. Pupils are equally  reactive. Throat was clear.   NECK: Supple.   CARDIAC: Regular rhythm and rate.   LUNGS: Clear bilaterally.   ABDOMEN: Normoactive bowel sounds, soft, nontender. I did not appreciate any obvious hepatomegaly today.   EXTREMITIES: No clubbing, cyanosis, or edema.   NEUROLOGIC: He seems pretty alert and awake.   LABORATORY, DIAGNOSTIC, AND RADIOLOGICAL DATA: Sodium 143, potassium 5.5, is 4.3 this morning, chloride 108, CO2 19, BUN 15, creatinine 1.8, glucose 99. Liver enzymes showed total bilirubin 2.3, AST 310, ALT 138, hemoglobin 11.5, last night 8.2, this morning is probably more dilutional from IV fluids and active bleeding. INR is 1.4.   ASSESSMENT AND PLAN: This is a patient with chronic Hepatitis C and alcoholism. He probably has cirrhosis already. He may have had bleeding from esophageal varices or portal gastropathy. He is already on Protonix and octreotide drip. We need to monitor his hemoglobin. If it drops below 8 then he will need to be transfused. Will plan on doing an endoscopy tomorrow unless he has active bleeding today. Will give some clear liquids today. Will monitor his liver enzymes. If he has not had an ultrasound, he may benefit from getting an ultrasound of the abdomen as well.   Thank you for the referral.  ____________________________ Ezzard StandingPaul Y. Bluford Kaufmannh, MD pyo:drc D: 04/09/2011 10:34:14 ET T: 04/09/2011 11:23:22 ET JOB#: [id number] Ezzard StandingPAUL Y Emiel Kielty MD ELECTRONICALLY SIGNED 04/09/2011 14:37

## 2014-05-10 NOTE — H&P (Signed)
PATIENT NAME:  Gary [name], Gary [name] MR#:  [id number] DATE OF BIRTH:  [birth year]  DATE OF ADMISSION:  04/09/2011  PRIMARY CARE PHYSICIAN: He has no local doctor.   CHIEF COMPLAINT: Vomiting blood and generalized weakness associated with an episode of syncope.   HISTORY OF PRESENT ILLNESS: Mr. Haskew is a 55 year old Caucasian male with history of chronic Hepatitis C and chronic alcoholism. He is not seeing any physician and not taking any medication. Last evening he had an episode of hematemesis with bright red blood resulting in saturating his shirt with a large amount of blood. After that he had retching like vomiting but without further hematemesis. He developed generalized weakness. At that time he managed to call his relative who came and while trying to attempt to sit up he fainted. At that point the patient laid down and EMS was called and he was transported to the Emergency Department. His initial blood pressure as measured by EMT was 80/40. Upon arrival here the patient received resuscitation with IV fluids using normal saline with bolus of a liter and then aggressive IV hydration. Subsequent blood pressure was improving up to 90 systolic. The patient is now being admitted to the Intensive Care Unit for further evaluation and management of his hematemesis. He denies having any abdominal pain. No prior history of upper GI bleed other than three months ago. He stated that he had a little blood upon vomiting but not much and also noted a little blood in his stool.   REVIEW OF SYSTEMS: CONSTITUTIONAL: Denies any fever. No chills. No night sweats. He admits having fatigue. EYES: No blurring of vision. No double vision. ENT: No hearing impairment. No sore throat. No dysphagia. CARDIOVASCULAR: No chest pain. No shortness of breath. No edema. He admitted having the episode of syncope. This was noticed by his relative. RESPIRATORY: No shortness of breath. No chest pain. No cough. No sputum production.  GASTROINTESTINAL: No abdominal pain. He had this episode of hematemesis as reported above. No diarrhea. No melena. No bright blood per rectum. GENITOURINARY: No dysuria or frequency of urination. MUSCULOSKELETAL: No joint pain or swelling. No muscular pain or swelling. INTEGUMENTARY: No skin rash. No ulcers. NEUROLOGY: No focal weakness. No seizure activity. No headache. PSYCHIATRY: No anxiety. No depression. ENDOCRINE: No night sweats. No polyuria or polydipsia. No heat or cold intolerance.    PAST MEDICAL HISTORY:  1. History of chronic Hepatitis C.  2. History of chronic alcoholism. He drinks primarily beer.   PAST SURGICAL HISTORY: History of appendectomy when he was a child.   SOCIAL HISTORY: He is divorced. Lives at home. He is unemployed.   FAMILY HISTORY: Both grandparents died from heart attack at old age. His father died at the age of 45 from heart attack. His mother was an alcoholic and she had breast cancer.   SOCIAL HABITS: Nonsmoker. He drinks about 240 ounces of beer per day. He does not drink any liquor. No other drug abuse.   ADMISSION MEDICATIONS: None.   ALLERGIES: No known drug allergies.   PHYSICAL EXAMINATION:   VITAL SIGNS: Blood pressure 84/52, subsequent blood pressure was 90 systolic and now it is above 100 after IV hydration, respiratory rate 14, pulse 94, temperature 100, oxygen saturation 99%.   GENERAL APPEARANCE: Middle-aged male laying in bed in Trendelenburg position in no acute distress.   HEAD: Remarkable for mild pallor. No icterus. No cyanosis, although his skin is slightly yellowish.   EARS, NOSE, AND THROAT: Hearing was normal. Nasal mucosa,  lips, tongue were normal. He is edentulous.   EYES: Normal eyes and conjunctivae. Pupils about 5 to 6 mm, equal and reactive to light.   NECK: Supple. Trachea at midline. No thyromegaly. No cervical lymphadenopathy. No masses.   HEART: Normal S1, S2. No S3, S4. No murmur. No gallop. No carotid bruits.    RESPIRATORY: Normal breathing pattern without use of accessory muscles. No rales. No wheezing.   ABDOMEN: Soft without tenderness. No masses. No hepatosplenomegaly. No hernias.   SKIN: No ulcers. No subcutaneous nodules.   MUSCULOSKELETAL: No joint swelling. No clubbing.   NEUROLOGIC: Cranial nerves II through XII are intact. No focal motor deficit.   PSYCHIATRY: The patient is alert and oriented x3. Mood and affect were normal.   LABORATORY, DIAGNOSTIC, AND RADIOLOGICAL DATA: EKG showed normal sinus rhythm at rate of 80 per minute. Normal. EKG. Serum glucose 99, BUN 15, creatinine 1.8, estimated GFR 40, serum sodium 143, potassium 5.5, calcium 7.5, albumin 3, bilirubin 2.3, alkaline phosphatase normal at 93, AST 310, ALT 138. Troponin 0.02. CBC showed white count 8000, hemoglobin 11.5, hematocrit 33, platelet count 103. Normal MCV, MCH, and MCHC. Prothrombin time slightly elevated at 17.4. INR 1.4.   ASSESSMENT:  1. Upper GI bleed presenting with hematemesis.  2. Hemorrhagic shock secondary to upper GI bleed.  3. Anemia secondary to acute blood loss from upper GI bleed.  4. Chronic Hepatitis C.  5. Possible underlying liver cirrhosis.  6. Hyperkalemia.  7. Thrombocytopenia.  8. Chronic alcoholism. 9. Mildly elevated prothrombin time.  10. Chronic kidney disease, stage III.   PLAN:  1. Continue IV hydration with normal saline at 150 mL/h.  2. IV Protonix drip.  3. Intravenous protocol of octreotide.  4. Keep patient n.p.o.  5. NG tube was ordered by the Emergency Department and I will discontinue that after evaluation if there is active bleeding in preparation for having upper endoscopy in the morning.  6. I will discontinue the order of NG tube and intermittent suction and simply discontinue the NG tube if there is no active bleeding.  7. GI consultation with Dr. Bluford Kaufmannh. I already called him and alerted him about this patient's condition and to consider seeing him first thing in the  morning.  8. Check hemoglobin q.6 hours.  9. Check basic metabolic profile in the morning to follow-up on his potassium.   TIME SPENT EVALUATING THIS PATIENT: More than one hour.   ____________________________ Carney CornersAmir M. Rudene Rearwish, MD amd:drc D: 04/09/2011 00:59:16 ET T: 04/09/2011 09:32:55 ET JOB#: [id number]  cc: Carney CornersAmir M. Rudene Rearwish, MD, <Dictator> Zollie ScaleAMIR M Taniesha Glanz MD ELECTRONICALLY SIGNED 04/09/2011 22:23

## 2014-10-17 DIAGNOSIS — L0390 Cellulitis, unspecified: Secondary | ICD-10-CM

## 2014-10-17 DIAGNOSIS — I872 Venous insufficiency (chronic) (peripheral): Secondary | ICD-10-CM

## 2014-10-17 HISTORY — DX: Cellulitis, unspecified: L03.90

## 2014-10-17 HISTORY — DX: Venous insufficiency (chronic) (peripheral): I87.2

## 2014-10-20 ENCOUNTER — Encounter (HOSPITAL_COMMUNITY): Payer: Self-pay | Admitting: *Deleted

## 2014-10-20 ENCOUNTER — Emergency Department (HOSPITAL_COMMUNITY): Admit: 2014-10-20 | Discharge: 2014-10-20 | Disposition: A | Discharge status: Home / Self Care | Payer: Medicaid Other

## 2014-10-20 ENCOUNTER — Other Ambulatory Visit: Payer: Self-pay

## 2014-10-20 ENCOUNTER — Inpatient Hospital Stay (HOSPITAL_COMMUNITY)
Admission: EM | Admit: 2014-10-20 | Discharge: 2014-10-24 | DRG: 603 | Disposition: A | Discharge status: Home Health | Payer: Medicaid Other | Attending: Internal Medicine | Admitting: Internal Medicine

## 2014-10-20 ENCOUNTER — Emergency Department (HOSPITAL_COMMUNITY): Payer: Medicaid Other

## 2014-10-20 DIAGNOSIS — K7460 Unspecified cirrhosis of liver: Secondary | ICD-10-CM | POA: Diagnosis present

## 2014-10-20 DIAGNOSIS — Z79899 Other long term (current) drug therapy: Secondary | ICD-10-CM | POA: Diagnosis not present

## 2014-10-20 DIAGNOSIS — D696 Thrombocytopenia, unspecified: Secondary | ICD-10-CM | POA: Diagnosis not present

## 2014-10-20 DIAGNOSIS — G40909 Epilepsy, unspecified, not intractable, without status epilepticus: Secondary | ICD-10-CM | POA: Diagnosis present

## 2014-10-20 DIAGNOSIS — L03115 Cellulitis of right lower limb: Secondary | ICD-10-CM | POA: Diagnosis present

## 2014-10-20 DIAGNOSIS — M79604 Pain in right leg: Secondary | ICD-10-CM | POA: Diagnosis present

## 2014-10-20 DIAGNOSIS — R609 Edema, unspecified: Secondary | ICD-10-CM | POA: Diagnosis not present

## 2014-10-20 DIAGNOSIS — B1920 Unspecified viral hepatitis C without hepatic coma: Secondary | ICD-10-CM | POA: Diagnosis present

## 2014-10-20 DIAGNOSIS — R0602 Shortness of breath: Secondary | ICD-10-CM

## 2014-10-20 DIAGNOSIS — L0390 Cellulitis, unspecified: Secondary | ICD-10-CM | POA: Diagnosis present

## 2014-10-20 DIAGNOSIS — D649 Anemia, unspecified: Secondary | ICD-10-CM | POA: Diagnosis present

## 2014-10-20 DIAGNOSIS — D6959 Other secondary thrombocytopenia: Secondary | ICD-10-CM | POA: Diagnosis present

## 2014-10-20 LAB — BASIC METABOLIC PANEL
Anion gap: 9 (ref 5–15)
BUN: 11 mg/dL (ref 6–20)
CALCIUM: 8.1 mg/dL — AB (ref 8.9–10.3)
CHLORIDE: 103 mmol/L (ref 101–111)
CO2: 21 mmol/L — ABNORMAL LOW (ref 22–32)
CREATININE: 0.95 mg/dL (ref 0.61–1.24)
Glucose, Bld: 161 mg/dL — ABNORMAL HIGH (ref 65–99)
Potassium: 4.2 mmol/L (ref 3.5–5.1)
SODIUM: 133 mmol/L — AB (ref 135–145)

## 2014-10-20 LAB — CBC
HCT: 36.5 % — ABNORMAL LOW (ref 39.0–52.0)
Hemoglobin: 12.5 g/dL — ABNORMAL LOW (ref 13.0–17.0)
MCH: 36.5 pg — ABNORMAL HIGH (ref 26.0–34.0)
MCHC: 34.2 g/dL (ref 30.0–36.0)
MCV: 106.7 fL — ABNORMAL HIGH (ref 78.0–100.0)
PLATELETS: 68 10*3/uL — AB (ref 150–400)
RBC: 3.42 MIL/uL — AB (ref 4.22–5.81)
RDW: 15.1 % (ref 11.5–15.5)
WBC: 7.1 10*3/uL (ref 4.0–10.5)

## 2014-10-20 LAB — TROPONIN I: Troponin I: <0.03 ng/mL (ref <0.031)

## 2014-10-20 LAB — HEPATIC FUNCTION PANEL
ALT: 34 U/L (ref 17–63)
AST: 77 U/L — AB (ref 15–41)
Albumin: 2.1 g/dL — ABNORMAL LOW (ref 3.5–5.0)
Alkaline Phosphatase: 117 U/L (ref 38–126)
Bilirubin, Direct: 3.1 mg/dL — ABNORMAL HIGH (ref 0.1–0.5)
Indirect Bilirubin: 4.8 mg/dL — ABNORMAL HIGH (ref 0.3–0.9)
TOTAL PROTEIN: 5.7 g/dL — AB (ref 6.5–8.1)
Total Bilirubin: 7.9 mg/dL — ABNORMAL HIGH (ref 0.3–1.2)

## 2014-10-20 LAB — BRAIN NATRIURETIC PEPTIDE: B NATRIURETIC PEPTIDE 5: 191 pg/mL — AB (ref 0.0–100.0)

## 2014-10-20 LAB — GLUCOSE, CAPILLARY: Glucose-Capillary: 112 mg/dL — ABNORMAL HIGH (ref 65–99)

## 2014-10-20 MED ORDER — FUROSEMIDE 40 MG PO TABS: 40.0000 mg | ORAL_TABLET | Freq: Every day | ORAL | Status: DC

## 2014-10-20 MED ORDER — PROPRANOLOL HCL 20 MG PO TABS
20.0000 mg | ORAL_TABLET | Freq: Two times a day (BID) | ORAL | Status: DC
  Administered 2014-10-20 – 2014-10-24 (×6): 20 mg via ORAL
  Filled 2014-10-20: qty 2
  Filled 2014-10-20: qty 1
  Filled 2014-10-20: qty 2
  Filled 2014-10-20: qty 1
  Filled 2014-10-20: qty 2
  Filled 2014-10-20 (×2): qty 1
  Filled 2014-10-20 (×2): qty 2
  Filled 2014-10-20 (×3): qty 1
  Filled 2014-10-20: qty 2
  Filled 2014-10-20 (×2): qty 1

## 2014-10-20 MED ORDER — RIFAXIMIN 550 MG PO TABS
550.0000 mg | ORAL_TABLET | Freq: Two times a day (BID) | ORAL | Status: DC
  Administered 2014-10-20 – 2014-10-24 (×8): 550 mg via ORAL
  Filled 2014-10-20 (×8): qty 1

## 2014-10-20 MED ORDER — SODIUM CHLORIDE 0.9 % IJ SOLN
3.0000 mL | Freq: Two times a day (BID) | INTRAMUSCULAR | Status: DC
  Administered 2014-10-20 – 2014-10-23 (×4): 3 mL via INTRAVENOUS

## 2014-10-20 MED ORDER — DICYCLOMINE HCL 10 MG PO CAPS
10.0000 mg | ORAL_CAPSULE | Freq: Three times a day (TID) | ORAL | Status: DC | PRN
  Administered 2014-10-22: 10 mg via ORAL
  Filled 2014-10-20: qty 1

## 2014-10-20 MED ORDER — ONDANSETRON HCL 4 MG PO TABS: 4.0000 mg | ORAL_TABLET | Freq: Four times a day (QID) | ORAL | Status: DC | PRN

## 2014-10-20 MED ORDER — LEVETIRACETAM 500 MG PO TABS: 1000.0000 mg | ORAL_TABLET | Freq: Two times a day (BID) | ORAL | Status: DC

## 2014-10-20 MED ORDER — MORPHINE SULFATE (PF) 2 MG/ML IV SOLN
2.0000 mg | INTRAVENOUS | Status: DC | PRN
  Administered 2014-10-20 – 2014-10-24 (×7): 2 mg via INTRAVENOUS
  Filled 2014-10-20 (×8): qty 1

## 2014-10-20 MED ORDER — SPIRONOLACTONE 50 MG PO TABS: 50.0000 mg | ORAL_TABLET | Freq: Every day | ORAL | Status: DC

## 2014-10-20 MED ORDER — LACTULOSE 10 GM/15ML PO SOLN: 20.0000 g | Freq: Every day | ORAL | Status: DC

## 2014-10-20 MED ORDER — CEFAZOLIN SODIUM-DEXTROSE 2-3 GM-% IV SOLR
2.0000 g | Freq: Three times a day (TID) | INTRAVENOUS | Status: DC
  Administered 2014-10-20 – 2014-10-24 (×11): 2 g via INTRAVENOUS
  Filled 2014-10-20 (×14): qty 50

## 2014-10-20 MED ORDER — PANTOPRAZOLE SODIUM 40 MG PO TBEC
40.0000 mg | DELAYED_RELEASE_TABLET | Freq: Every day | ORAL | Status: DC
  Administered 2014-10-21 – 2014-10-24 (×4): 40 mg via ORAL
  Filled 2014-10-20 (×4): qty 1

## 2014-10-20 MED ORDER — ONDANSETRON HCL 4 MG/2ML IJ SOLN: 4.0000 mg | Freq: Four times a day (QID) | INTRAMUSCULAR | Status: DC | PRN

## 2014-10-20 MED ORDER — CEFAZOLIN SODIUM-DEXTROSE 2-3 GM-% IV SOLR
2.0000 g | Freq: Three times a day (TID) | INTRAVENOUS | Status: DC
  Filled 2014-10-20 (×2): qty 50

## 2014-10-20 MED ORDER — VANCOMYCIN HCL IN DEXTROSE 1-5 GM/200ML-% IV SOLN
1000.0000 mg | Freq: Once | INTRAVENOUS | Status: AC
  Administered 2014-10-20: 1000 mg via INTRAVENOUS
  Filled 2014-10-20: qty 200

## 2014-10-20 MED ORDER — PANTOPRAZOLE SODIUM 40 MG PO TBEC: 40.0000 mg | DELAYED_RELEASE_TABLET | Freq: Every day | ORAL | Status: DC

## 2014-10-20 NOTE — ED Provider Notes (Signed)
CSN: [id number]     Arrival date & time 10/20/14  1310 History   First MD Initiated Contact with Patient 10/20/14 1448     Chief Complaint  Patient presents with  . Leg Swelling  . Shortness of Breath     (Consider location/radiation/quality/duration/timing/severity/associated sxs/prior Treatment) HPI Comments: Pt comes in with c/o bilateral leg swelling for the last couple of days. He states that the right leg has gotten worse then the left and he is also having redness to the the area. Denies any injury. He states that he has also noticed sob over the last couple of weeks. Walking up a flight of stairs makes him winded. He states that he is talking a fluid pill. Denies cp. He does drink etoh, but states that he drinks a lot less since his subdural  The history is provided by the patient. No language interpreter was used.    Past Medical History  Diagnosis Date  . Hepatitis   . Cirrhosis (HCC)   . SDH (subdural hematoma) (HCC)   . Seizure [facility])    Past Surgical History  Procedure Laterality Date  . Craniotomy Right 10/21/2013    Procedure: CRANIOTOMY HEMATOMA EVACUATION SUBDURAL;  Surgeon: Maeola Harman, MD;  Location: [facility];  Service: Neurosurgery;  Laterality: Right;   Family History  Problem Relation Age of Onset  . Cirrhosis Neg Hx   . Seizures Neg Hx    Social History  Substance Use Topics  . Smoking status: Never Smoker   . Smokeless tobacco: None  . Alcohol Use: Yes    Review of Systems  All other systems reviewed and are negative.     Allergies  Review of patient's allergies indicates no known allergies.  Home Medications   Prior to Admission medications   Medication Sig Start Date End Date Taking? Authorizing Provider  acetaminophen (TYLENOL) 500 MG tablet Take 1,000 mg by mouth every 6 (six) hours as needed for moderate pain.    Historical Provider, MD  dexlansoprazole (DEXILANT) 60 MG capsule Take 60 mg by mouth daily.    Historical Provider, MD   dicyclomine (BENTYL) 10 MG capsule Take 10 mg by mouth 3 (three) times daily as needed for spasms.     Historical Provider, MD  furosemide (LASIX) 40 MG tablet Take 1 tablet (40 mg total) by mouth daily. 11/03/13   Clydia Llano, MD  lactulose (CHRONULAC) 10 GM/15ML solution Take 20 g by mouth daily.     Historical Provider, MD  levETIRAcetam (KEPPRA) 1000 MG tablet Take 1 tablet (1,000 mg total) by mouth 2 (two) times daily. 11/03/13   Clydia Llano, MD  propranolol (INDERAL) 20 MG tablet Take 20 mg by mouth 2 (two) times daily.    Historical Provider, MD  rifaximin (XIFAXAN) 550 MG TABS tablet Take 550 mg by mouth 2 (two) times daily.    Historical Provider, MD  spironolactone (ALDACTONE) 50 MG tablet Take 1 tablet (50 mg total) by mouth daily. 11/03/13   Mutaz Elmahi, MD   BP 136/82 mmHg  Pulse 60  Temp(Src) 97.5 F (36.4 C) (Oral)  Resp 14  SpO2 99% Physical Exam  Constitutional: He is oriented to person, place, and time. He appears well-developed and well-nourished.  HENT:  Head: Normocephalic and atraumatic.  Pulmonary/Chest: Effort normal and breath sounds normal.  Abdominal: Soft. Bowel sounds are normal.  Musculoskeletal:  Bilateral lower edema noted. Worse on the right then left. Redness noted up to the knee on the right leg  Neurological: He is alert and oriented to person, place, and time. He exhibits normal muscle tone.  Skin:  Yellow in color  Psychiatric: He has a normal mood and affect.  Nursing note and vitals reviewed.   ED Course  Procedures (including critical care time) Labs Review Labs Reviewed  BASIC METABOLIC PANEL - Abnormal; Notable for the following:    Sodium 133 (*)    CO2 21 (*)    Glucose, Bld 161 (*)    Calcium 8.1 (*)    All other components within normal limits  CBC - Abnormal; Notable for the following:    RBC 3.42 (*)    Hemoglobin 12.5 (*)    HCT 36.5 (*)    MCV 106.7 (*)    MCH 36.5 (*)    Platelets 68 (*)    All other components  within normal limits  HEPATIC FUNCTION PANEL - Abnormal; Notable for the following:    Total Protein 5.7 (*)    Albumin 2.1 (*)    AST 77 (*)    Total Bilirubin 7.9 (*)    Bilirubin, Direct 3.1 (*)    Indirect Bilirubin 4.8 (*)    All other components within normal limits  BRAIN NATRIURETIC PEPTIDE - Abnormal; Notable for the following:    B Natriuretic Peptide 191.0 (*)    All other components within normal limits  TROPONIN I    Imaging Review Dg Chest 2 View  10/20/2014   CLINICAL DATA:  Shortness of breath for 1 week.  EXAM: CHEST  2 VIEW  COMPARISON:  10/31/2013  FINDINGS: The heart size and mediastinal contours are within normal limits. Both lungs are clear. The visualized skeletal structures are unremarkable.  IMPRESSION: No active cardiopulmonary disease.   Electronically Signed   By: Charlett Nose M.D.   On: 10/20/2014 14:01   I have personally reviewed and evaluated these images and lab results as part of my medical decision-making.  ED ECG REPORT   Date: 10/20/2014  Rate: 59  Rhythm: normal sinus rhythm  QRS Axis: normal  Intervals: PR shortened  ST/T Wave abnormalities: normal  Conduction Disutrbances:none  Narrative Interpretation:   Old EKG Reviewed: none available  I have personally reviewed the EKG tracing and agree with the computerized printout as noted.   MDM   Final diagnoses:  SOB (shortness of breath)  Edema, unspecified type  Cellulitis of right lower extremity    Pt to be admitted to the hospital. Pt in agreement with plan    Teressa Lower, NP 10/20/14 2051  Mirian Mo, MD 10/22/14 1534

## 2014-10-20 NOTE — ED Notes (Signed)
Vascular at bedside

## 2014-10-20 NOTE — ED Notes (Signed)
Patient with bilateral lower leg swelling, right worse than left. Patient states history of intermittent swelling but it has never been like this. Patient reports this swelling has been noted for approx 1.5 weeks. Patient also reports tingling to lower extremities. Patient reports sob with ambulation, this is new for the patient.

## 2014-10-20 NOTE — Progress Notes (Signed)
*  PRELIMINARY RESULTS* Vascular Ultrasound Right lower extremity venous duplex has been completed.  Preliminary findings: No evidence of DVT or baker's cyst.   Farrel Demark, RDMS, RVT  10/20/2014, 4:02 PM

## 2014-10-20 NOTE — H&P (Signed)
[facility] History and Physical  BENJIMIN HADDEN UJW:[id number] DOB: 10-24-1960 DOA: 10/20/2014  Referring physician: Emergency Department PCP: Rodman Key, FNP  Specialists:   Chief Complaint: Cellulitis  HPI: Gary [name] is a 54 y.o. male  With a hx of hep c cirrhosis, hx of seizure d/o who presents with one week hx of worsening redness and tenderness of RLE. Pt states area began with pruritic lesion, which pt began to scratch with a brush. Area became more erythematous, edematous, and tender. Pt then presented to ED where pt found to have normal WBC and afebrile. LE dopplers neg for DVT. Pt started on empiric vanc in ED and hospitalist consulted for admission.  Review of Systems:  Review of Systems  Constitutional: Negative for fever and chills.  HENT: Negative for ear pain and tinnitus.   Eyes: Negative for pain and discharge.  Respiratory: Negative for hemoptysis and shortness of breath.   Cardiovascular: Negative for chest pain and orthopnea.  Gastrointestinal: Negative for nausea and vomiting.  Genitourinary: Negative for frequency and flank pain.  Musculoskeletal: Negative for joint pain and neck pain.  Skin: Positive for itching and rash.  Neurological: Negative for tingling, tremors and loss of consciousness.  Psychiatric/Behavioral: Negative for hallucinations and substance abuse.     Past Medical History  Diagnosis Date  . Hepatitis   . Cirrhosis (HCC)   . SDH (subdural hematoma) (HCC)   . Seizure [facility])    Past Surgical History  Procedure Laterality Date  . Craniotomy Right 10/21/2013    Procedure: CRANIOTOMY HEMATOMA EVACUATION SUBDURAL;  Surgeon: Maeola Harman, MD;  Location: [facility];  Service: Neurosurgery;  Laterality: Right;   Social History:  reports that he has never smoked. He does not have any smokeless tobacco history on file. He reports that he drinks alcohol. He reports that he uses illicit drugs (Cocaine).  where does patient  live--home, ALF, SNF? and with whom if at home?  Can patient participate in ADLs?  No Known Allergies  Family History  Problem Relation Age of Onset  . Cirrhosis Neg Hx   . Seizures Neg Hx     (be sure to complete)  Prior to Admission medications   Medication Sig Start Date End Date Taking? Authorizing Provider  acetaminophen (TYLENOL) 500 MG tablet Take 1,000 mg by mouth every 6 (six) hours as needed for moderate pain.    Historical Provider, MD  dexlansoprazole (DEXILANT) 60 MG capsule Take 60 mg by mouth daily.    Historical Provider, MD  dicyclomine (BENTYL) 10 MG capsule Take 10 mg by mouth 3 (three) times daily as needed for spasms.     Historical Provider, MD  furosemide (LASIX) 40 MG tablet Take 1 tablet (40 mg total) by mouth daily. 11/03/13   Clydia Llano, MD  lactulose (CHRONULAC) 10 GM/15ML solution Take 20 g by mouth daily.     Historical Provider, MD  levETIRAcetam (KEPPRA) 1000 MG tablet Take 1 tablet (1,000 mg total) by mouth 2 (two) times daily. 11/03/13   Clydia Llano, MD  propranolol (INDERAL) 20 MG tablet Take 20 mg by mouth 2 (two) times daily.    Historical Provider, MD  rifaximin (XIFAXAN) 550 MG TABS tablet Take 550 mg by mouth 2 (two) times daily.    Historical Provider, MD  spironolactone (ALDACTONE) 50 MG tablet Take 1 tablet (50 mg total) by mouth daily. 11/03/13   Clydia Llano, MD   Physical Exam: Filed Vitals:   10/20/14 1530 10/20/14 1545 10/20/14  1601 10/20/14 1630  BP: 127/84 136/82 138/80 116/78  Pulse: 58 60 62 63  Temp:      TempSrc:      Resp: SpO2: 100% 99% 100% 99%     General:  Awake, in nad  Eyes: PERRL B  ENT: membranes moist, dentition fair  Neck: trachea midline, neck supple  Cardiovascular: regular, s1, s2  Respiratory: normal resp effort, no wheezing  Abdomen: soft,nondistended  Skin: poorly demarcated tender erytthematous patch over RLE without drainage or purulence  Musculoskeletal: perfused, no  clubbing  Psychiatric: mood/affect normal//no auditory/visual hallucinations  Neurologic: cn2-12 grossly intact, strength/sensation intact  Labs on Admission:  Basic Metabolic Panel:  Recent Labs Lab 10/20/14 1351  NA 133*  K 4.2  CL 103  CO2 21*  GLUCOSE 161*  BUN 11  CREATININE 0.95  CALCIUM 8.1*   Liver Function Tests:  Recent Labs Lab 10/20/14 1518  AST 77*  ALT 34  ALKPHOS 117  BILITOT 7.9*  PROT 5.7*  ALBUMIN 2.1*   No results for input(s): LIPASE, AMYLASE in the last 168 hours. No results for input(s): AMMONIA in the last 168 hours. CBC:  Recent Labs Lab 10/20/14 1351  WBC 7.1  HGB 12.5*  HCT 36.5*  MCV 106.7*  PLT 68*   Cardiac Enzymes:  Recent Labs Lab 10/20/14 1518  TROPONINI <0.03    BNP (last 3 results)  Recent Labs  10/20/14 1518  BNP 191.0*    ProBNP (last 3 results) No results for input(s): PROBNP in the last 8760 hours.  CBG: No results for input(s): GLUCAP in the last 168 hours.  Radiological Exams on Admission: Dg Chest 2 View  10/20/2014   CLINICAL DATA:  Shortness of breath for 1 week.  EXAM: CHEST  2 VIEW  COMPARISON:  10/31/2013  FINDINGS: The heart size and mediastinal contours are within normal limits. Both lungs are clear. The visualized skeletal structures are unremarkable.  IMPRESSION: No active cardiopulmonary disease.   Electronically Signed   By: Charlett Nose M.D.   On: 10/20/2014 14:01    EKG: Independently reviewed. NSR  Assessment/Plan Principal Problem:   Cellulitis Active Problems:   Cirrhosis (HCC)   Chronic anemia   1. RLE cellulitis 1. Not septic 2. One dose of vanc given in ED 3. Cellulitis is nonpurulent, thus will cont on ancef IV 4. Follow blood cultures 5. Admit to med-surg 2. Hep C cirrhosis 1. Primary hepatologist in [place] 2. Would follow lft's while here 3. Avoid hepato-toxins 3. Chronic anemia 1. Follow closely 2. Likely secondary to  cirrhosis 4. Thrombocytopenia 1. Likely secondary to cirrhosis 2. Follow. No active bleeding at this time 5. Hx seizures 1. Cont keppra per home med 6. DVT prophylaxis 1. Avoid anticoagulant given thrombocytopenia 2. Will order SCD's  Code Status: Full (must indicate code status--if unknown or must be presumed, indicate so) Family Communication: Pt in room (indicate person spoken with, if applicable, with phone number if by telephone) Disposition Plan: admit to med-surg (indicate anticipated LOS)   Porsha Skilton K [facility] Pager [phone]  If 7PM-7AM, please contact night-coverage [email or web address] Password [email or web address] 10/20/2014, 4:49 PM

## 2014-10-21 DIAGNOSIS — D649 Anemia, unspecified: Secondary | ICD-10-CM

## 2014-10-21 DIAGNOSIS — K7460 Unspecified cirrhosis of liver: Secondary | ICD-10-CM

## 2014-10-21 DIAGNOSIS — G40909 Epilepsy, unspecified, not intractable, without status epilepticus: Secondary | ICD-10-CM

## 2014-10-21 DIAGNOSIS — L0390 Cellulitis, unspecified: Secondary | ICD-10-CM

## 2014-10-21 LAB — CBC
HCT: 32 % — ABNORMAL LOW (ref 39.0–52.0)
HEMOGLOBIN: 10.9 g/dL — AB (ref 13.0–17.0)
MCH: 36.5 pg — AB (ref 26.0–34.0)
MCHC: 34.1 g/dL (ref 30.0–36.0)
MCV: 107 fL — ABNORMAL HIGH (ref 78.0–100.0)
PLATELETS: 56 10*3/uL — AB (ref 150–400)
RBC: 2.99 MIL/uL — AB (ref 4.22–5.81)
RDW: 15.3 % (ref 11.5–15.5)
WBC: 5.3 10*3/uL (ref 4.0–10.5)

## 2014-10-21 LAB — COMPREHENSIVE METABOLIC PANEL
ALK PHOS: 97 U/L (ref 38–126)
ALT: 27 U/L (ref 17–63)
ANION GAP: 6 (ref 5–15)
AST: 59 U/L — ABNORMAL HIGH (ref 15–41)
Albumin: 1.7 g/dL — ABNORMAL LOW (ref 3.5–5.0)
BUN: 8 mg/dL (ref 6–20)
CALCIUM: 7.6 mg/dL — AB (ref 8.9–10.3)
CHLORIDE: 103 mmol/L (ref 101–111)
CO2: 24 mmol/L (ref 22–32)
CREATININE: 0.79 mg/dL (ref 0.61–1.24)
Glucose, Bld: 107 mg/dL — ABNORMAL HIGH (ref 65–99)
Potassium: 4 mmol/L (ref 3.5–5.1)
SODIUM: 133 mmol/L — AB (ref 135–145)
Total Bilirubin: 6.5 mg/dL — ABNORMAL HIGH (ref 0.3–1.2)
Total Protein: 4.8 g/dL — ABNORMAL LOW (ref 6.5–8.1)

## 2014-10-21 LAB — HIV ANTIBODY (ROUTINE TESTING W REFLEX): HIV Screen 4th Generation wRfx: NONREACTIVE

## 2014-10-21 LAB — GLUCOSE, CAPILLARY
GLUCOSE-CAPILLARY: 109 mg/dL — AB (ref 65–99)
Glucose-Capillary: 107 mg/dL — ABNORMAL HIGH (ref 65–99)

## 2014-10-21 MED ORDER — INFLUENZA VAC SPLIT QUAD 0.5 ML IM SUSY
0.5000 mL | PREFILLED_SYRINGE | INTRAMUSCULAR | Status: AC
  Administered 2014-10-22: 0.5 mL via INTRAMUSCULAR
  Filled 2014-10-21: qty 0.5

## 2014-10-21 MED ORDER — PNEUMOCOCCAL VAC POLYVALENT 25 MCG/0.5ML IJ INJ
0.5000 mL | INJECTION | INTRAMUSCULAR | Status: AC
  Administered 2014-10-22: 0.5 mL via INTRAMUSCULAR
  Filled 2014-10-21: qty 0.5

## 2014-10-21 MED ORDER — LACTULOSE 10 GM/15ML PO SOLN
20.0000 g | Freq: Two times a day (BID) | ORAL | Status: DC
  Administered 2014-10-21 – 2014-10-24 (×7): 20 g via ORAL
  Filled 2014-10-21 (×7): qty 30

## 2014-10-21 NOTE — Care Management Note (Signed)
Case Management Note  Patient Details  Name: Gary [name] MRN: [id number] Date of Birth: 07-14-1960  Subjective/Objective:                    Action/Plan:  Initial UR completed  Expected Discharge Date:                  Expected Discharge Plan:  Home/Self Care  In-House Referral:     Discharge planning Services     Post Acute Care Choice:    Choice offered to:     DME Arranged:    DME Agency:     HH Arranged:    HH Agency:     Status of Service:  In process, will continue to follow  Medicare Important Message Given:    Date Medicare IM Given:    Medicare IM give by:    Date Additional Medicare IM Given:    Additional Medicare Important Message give by:     If discussed at Long Length of Stay Meetings, dates discussed:    Additional Comments:  Kingsley Plan, RN 10/21/2014, 10:44 AM

## 2014-10-21 NOTE — Progress Notes (Signed)
[facility]                                                                              Patient Demographics  Gary [name], is a 54 y.o. male, DOB - 1960-02-24, ZOX:[id number]  Admit date - 10/20/2014   Admitting Physician Jerald Kief, MD  Outpatient Primary MD for the patient is Rodman Key, FNP  LOS - 1   Chief Complaint  Patient presents with  . Leg Swelling  . Shortness of Breath      HPI on 10/20/2014 by Dr. Melonie Florida is a 54 y.o. male With a hx of hep c cirrhosis, hx of seizure d/o who presents with one week hx of worsening redness and tenderness of RLE. Pt states area began with pruritic lesion, which pt began to scratch with a brush. Area became more erythematous, edematous, and tender. Pt then presented to ED where pt found to have normal WBC and afebrile. LE dopplers neg for DVT. Pt started on empiric vanc in ED and hospitalist consulted for admission.  Assessment & Plan   Right lower extremely cellulitis -Patient received 1 dose of vancomycin in the emergency department -Cellulitis nonpurulent, continue IV cefazolin -Blood cultures show no growth to date -Continue pain control  Hepatitis C cirrhosis -Patient follows up with hepatologist in [place] -Will continue to monitor LFTs and avoid hepatotoxic medications -Continue lactulose, rifaximin, propranolol  Chronic anemia with thrombocytopenia -Likely secondary to cirrhosis, will continue to monitor CBC closely -No signs of active bleeding  Seizure disorder -Continue Keppra  Code Status: Full  Family Communication: None at bedside  Disposition Plan: Admitted.   Time Spent in minutes   30 minutes  Procedures  Lower extremity Doppler  Consults   None  DVT Prophylaxis  SCDs  Lab Results  Component Value Date   PLT 56* 10/21/2014    Medications  Scheduled Meds: .  ceFAZolin (ANCEF) IV  2 g Intravenous 3 times per day  . [START ON 10/22/2014] Influenza vac split  quadrivalent PF  0.5 mL Intramuscular Tomorrow-1000  . lactulose  20 g Oral BID  . pantoprazole  40 mg Oral Daily  . [START ON 10/22/2014] pneumococcal 23 valent vaccine  0.5 mL Intramuscular Tomorrow-1000  . propranolol  20 mg Oral BID  . rifaximin  550 mg Oral BID  . sodium chloride  3 mL Intravenous Q12H   Continuous Infusions:  PRN Meds:.dicyclomine, morphine injection, ondansetron **OR** ondansetron (ZOFRAN) IV  Antibiotics    Anti-infectives    Start     Dose/Rate Route Frequency Ordered Stop   10/20/14 2200  rifaximin (XIFAXAN) tablet 550 mg     550 mg Oral 2 times daily 10/20/14 1724     10/20/14 2200  ceFAZolin (ANCEF) IVPB 2 g/50 mL premix     2 g 100 mL/hr over 30 Minutes Intravenous 3 times per day 10/20/14 1843     10/20/14 1730  ceFAZolin (ANCEF) IVPB 2 g/50 mL premix  Status:  Discontinued     2 g 100 mL/hr over 30 Minutes Intravenous 3 times per day 10/20/14 1723 10/20/14 1842   10/20/14 1630  vancomycin (VANCOCIN) IVPB 1000 mg/200 mL  premix     1,000 mg 200 mL/hr over 60 Minutes Intravenous  Once 10/20/14 1627 10/20/14 1819     Subjective:   Mayford Knife seen and examined today.  Patient continues to complain of pain in his right leg. Feels the infection has not improved. Complains of swelling in both his right and left lower extremities. Denies chest pain, shortness of breath, abdominal pain, nausea, vomiting, diarrhea.  Does complain of mild constipation.  Objective:   Filed Vitals:   10/20/14 1745 10/20/14 1800 10/20/14 2131 10/21/14 0614  BP: 105/69 113/66 115/60 104/63  Pulse: 63 66 67 58  Temp: 98.7 F (37.1 C)  98.8 F (37.1 C) 98.5 F (36.9 C)  TempSrc: Oral  Oral Oral  Resp: SpO2: 97% 100% 99% 97%    Wt Readings from Last 3 Encounters:  10/31/13 92.8 kg (204 lb 9.4 oz)  10/28/13 91.899 kg (202 lb 9.6 oz)  10/24/13 98.4 kg (216 lb 14.9 oz)     Intake/Output Summary (Last 24 hours) at 10/21/14 1239 Last data filed at  10/21/14 0925  Gross per 24 hour  Intake    100 ml  Output    680 ml  Net   -580 ml    Exam  General: Well developed, well nourished, NAD, appears stated age  HEENT: NCAT, Icteic Sclera, mucous membranes moist.   Cardiovascular: S1 S2 auscultated, no rubs, murmurs or gallops. Regular rate and rhythm.  Respiratory: Clear to auscultation bilaterally with equal chest rise  Abdomen: Soft, nontender, nondistended, + bowel sounds  Extremities: warm dry without cyanosis clubbing. B/L LE edema  Neuro: AAOx3, nonfocal  Skin: RLE erythema, edema without drainage or purulence.  Psych: Normal affect and demeanor  Data Review   Micro Results Recent Results (from the past 240 hour(s))  Culture, blood (routine x 2)     Status: None (Preliminary result)   Collection Time: 10/20/14  7:39 PM  Result Value Ref Range Status   Specimen Description BLOOD RIGHT ARM  Final   Special Requests BOTTLES DRAWN AEROBIC AND ANAEROBIC 10   Final   Culture NO GROWTH < 24 HOURS  Final   Report Status PENDING  Incomplete  Culture, blood (routine x 2)     Status: None (Preliminary result)   Collection Time: 10/20/14  7:47 PM  Result Value Ref Range Status   Specimen Description BLOOD RIGHT HAND  Final   Special Requests BOTTLES DRAWN AEROBIC ONLY 5CC  Final   Culture NO GROWTH < 24 HOURS  Final   Report Status PENDING  Incomplete    Radiology Reports Dg Chest 2 View  10/20/2014   CLINICAL DATA:  Shortness of breath for 1 week.  EXAM: CHEST  2 VIEW  COMPARISON:  10/31/2013  FINDINGS: The heart size and mediastinal contours are within normal limits. Both lungs are clear. The visualized skeletal structures are unremarkable.  IMPRESSION: No active cardiopulmonary disease.   Electronically Signed   By: Charlett Nose M.D.   On: 10/20/2014 14:01    CBC  Recent Labs Lab 10/20/14 1351 10/21/14 0537  WBC 7.1 5.3  HGB 12.5* 10.9*  HCT 36.5* 32.0*  PLT 68* 56*  MCV 106.7* 107.0*  MCH 36.5* 36.5*  MCHC  34.2 34.1  RDW 15.1 15.3    Chemistries   Recent Labs Lab 10/20/14 1351 10/20/14 1518 10/21/14 0537  NA 133*  --  133*  K 4.2  --  4.0  CL 103  --  103  CO2 21*  --  24  GLUCOSE 161*  --  107*  BUN 11  --  8  CREATININE 0.95  --  0.79  CALCIUM 8.1*  --  7.6*  AST  --  77* 59*  ALT  --  34 27  ALKPHOS  --  117 97  BILITOT  --  7.9* 6.5*   ------------------------------------------------------------------------------------------------------------------ CrCl cannot be calculated (Unknown ideal weight.). ------------------------------------------------------------------------------------------------------------------ No results for input(s): HGBA1C in the last 72 hours. ------------------------------------------------------------------------------------------------------------------ No results for input(s): CHOL, HDL, LDLCALC, TRIG, CHOLHDL, LDLDIRECT in the last 72 hours. ------------------------------------------------------------------------------------------------------------------ No results for input(s): TSH, T4TOTAL, T3FREE, THYROIDAB in the last 72 hours.  Invalid input(s): FREET3 ------------------------------------------------------------------------------------------------------------------ No results for input(s): VITAMINB12, FOLATE, FERRITIN, TIBC, IRON, RETICCTPCT in the last 72 hours.  Coagulation profile No results for input(s): INR, PROTIME in the last 168 hours.  No results for input(s): DDIMER in the last 72 hours.  Cardiac Enzymes  Recent Labs Lab 10/20/14 1518  TROPONINI <0.03   ------------------------------------------------------------------------------------------------------------------ Invalid input(s): POCBNP    Khamiyah Grefe D.O. on 10/21/2014 at 12:39 PM  Between 7am to 7pm - Pager - [phone]  After 7pm go to [email or web address] - password [email or web address]  And look for the night coverage person covering for me after hours  [facility]  Group Office  [phone]

## 2014-10-22 DIAGNOSIS — L03115 Cellulitis of right lower limb: Principal | ICD-10-CM | POA: Insufficient documentation

## 2014-10-22 DIAGNOSIS — R609 Edema, unspecified: Secondary | ICD-10-CM | POA: Insufficient documentation

## 2014-10-22 DIAGNOSIS — D696 Thrombocytopenia, unspecified: Secondary | ICD-10-CM

## 2014-10-22 LAB — COMPREHENSIVE METABOLIC PANEL
ALBUMIN: 1.7 g/dL — AB (ref 3.5–5.0)
ALK PHOS: 100 U/L (ref 38–126)
ALT: 24 U/L (ref 17–63)
ANION GAP: 5 (ref 5–15)
AST: 55 U/L — ABNORMAL HIGH (ref 15–41)
BUN: 7 mg/dL (ref 6–20)
CALCIUM: 7.5 mg/dL — AB (ref 8.9–10.3)
CHLORIDE: 100 mmol/L — AB (ref 101–111)
CO2: 26 mmol/L (ref 22–32)
Creatinine, Ser: 0.82 mg/dL (ref 0.61–1.24)
GFR calc non Af Amer: >60 mL/min (ref >60)
Glucose, Bld: 103 mg/dL — ABNORMAL HIGH (ref 65–99)
POTASSIUM: 4.3 mmol/L (ref 3.5–5.1)
SODIUM: 131 mmol/L — AB (ref 135–145)
Total Bilirubin: 5.5 mg/dL — ABNORMAL HIGH (ref 0.3–1.2)
Total Protein: 4.8 g/dL — ABNORMAL LOW (ref 6.5–8.1)

## 2014-10-22 LAB — CBC
HCT: 33.7 % — ABNORMAL LOW (ref 39.0–52.0)
HEMOGLOBIN: 11.4 g/dL — AB (ref 13.0–17.0)
MCH: 36.9 pg — ABNORMAL HIGH (ref 26.0–34.0)
MCHC: 33.8 g/dL (ref 30.0–36.0)
MCV: 109.1 fL — ABNORMAL HIGH (ref 78.0–100.0)
PLATELETS: 60 10*3/uL — AB (ref 150–400)
RBC: 3.09 MIL/uL — AB (ref 4.22–5.81)
RDW: 15.3 % (ref 11.5–15.5)
WBC: 7 10*3/uL (ref 4.0–10.5)

## 2014-10-22 NOTE — Progress Notes (Signed)
[facility]                                                                              Patient Demographics  Gary [name], is a 54 y.o. male, DOB - 01/21/1960, ZOX:[id number]  Admit date - 10/20/2014   Admitting Physician Jerald Kief, MD  Outpatient Primary MD for the patient is Rodman Key, FNP  LOS - 2   Chief Complaint  Patient presents with  . Leg Swelling  . Shortness of Breath      HPI on 10/20/2014 by Dr. Melonie Florida is a 54 y.o. male With a hx of hep c cirrhosis, hx of seizure d/o who presents with one week hx of worsening redness and tenderness of RLE. Pt states area began with pruritic lesion, which pt began to scratch with a brush. Area became more erythematous, edematous, and tender. Pt then presented to ED where pt found to have normal WBC and afebrile. LE dopplers neg for DVT. Pt started on empiric vanc in ED and hospitalist consulted for admission.  Assessment & Plan   Right lower extremely cellulitis -Patient received 1 dose of vancomycin in the emergency department -Cellulitis nonpurulent, continue IV cefazolin -Blood cultures show no growth to date -Continue pain control -Have consulted PT as patient complains of pain with ambulation -Right Lower extremity Doppler: no evidence of DVT, or baker's cyst  Hepatitis C cirrhosis -Patient follows up with hepatologist in [place] -Will continue to monitor LFTs and avoid hepatotoxic medications -Continue lactulose, rifaximin, propranolol  Chronic anemia with thrombocytopenia -Likely secondary to cirrhosis, will continue to monitor CBC closely -Hb currently stable, 11.4, [place] 60 -No signs of active bleeding  Seizure disorder -Continue Keppra  Code Status: Full  Family Communication: None at bedside  Disposition Plan: Admitted. Consulted PT.  Will give additional 24hrs of antibiotics and continue to monitor  Time Spent in minutes   30 minutes  Procedures  Right Lower  extremity Doppler: no evidence of DVT, or baker's cyst  Consults   None  DVT Prophylaxis  SCDs  Lab Results  Component Value Date   PLT 60* 10/22/2014    Medications  Scheduled Meds: .  ceFAZolin (ANCEF) IV  2 g Intravenous 3 times per day  . Influenza vac split quadrivalent PF  0.5 mL Intramuscular Tomorrow-1000  . lactulose  20 g Oral BID  . pantoprazole  40 mg Oral Daily  . pneumococcal 23 valent vaccine  0.5 mL Intramuscular Tomorrow-1000  . propranolol  20 mg Oral BID  . rifaximin  550 mg Oral BID  . sodium chloride  3 mL Intravenous Q12H   Continuous Infusions:  PRN Meds:.dicyclomine, morphine injection, ondansetron **OR** ondansetron (ZOFRAN) IV  Antibiotics    Anti-infectives    Start     Dose/Rate Route Frequency Ordered Stop   10/20/14 2200  rifaximin (XIFAXAN) tablet 550 mg     550 mg Oral 2 times daily 10/20/14 1724     10/20/14 2200  ceFAZolin (ANCEF) IVPB 2 g/50 mL premix     2 g 100 mL/hr over 30 Minutes Intravenous 3 times per day 10/20/14 1843     10/20/14 1730  ceFAZolin (  ANCEF) IVPB 2 g/50 mL premix  Status:  Discontinued     2 g 100 mL/hr over 30 Minutes Intravenous 3 times per day 10/20/14 1723 10/20/14 1842   10/20/14 1630  vancomycin (VANCOCIN) IVPB 1000 mg/200 mL premix     1,000 mg 200 mL/hr over 60 Minutes Intravenous  Once 10/20/14 1627 10/20/14 1819     Subjective:   Gary [name] seen and examined today.  Patient continue to complain of pain in his right leg and swelling in both of his legs.  He feels the redness has improved.  Patient denies chest pain, shortness of breath, abdominal pain, nausea, vomiting. Patient was able to have a bowel movement yesterday.  Objective:   Filed Vitals:   10/21/14 0614 10/21/14 1405 10/21/14 2200 10/22/14 0443  BP: 104/63 111/60 100/76 99/60  Pulse: 58 60 58 53  Temp: 98.5 F (36.9 C) 98 F (36.7 C) 97.8 F (36.6 C) 98.5 F (36.9 C)  TempSrc: Oral Oral Oral Oral  Resp: SpO2: 97%  98% 99% 98%    Wt Readings from Last 3 Encounters:  10/31/13 92.8 kg (204 lb 9.4 oz)  10/28/13 91.899 kg (202 lb 9.6 oz)  10/24/13 98.4 kg (216 lb 14.9 oz)     Intake/Output Summary (Last 24 hours) at 10/22/14 1020 Last data filed at 10/22/14 0447  Gross per 24 hour  Intake     50 ml  Output    200 ml  Net   -150 ml    Exam  General: Well developed, well nourished, NAD  HEENT: NCAT, Icteic Sclera, mucous membranes moist.   Cardiovascular: S1 S2 auscultated, no murmurs, RRR  Respiratory: Clear to auscultation   Abdomen: Soft, nontender, nondistended, + bowel sounds  Extremities: warm dry without cyanosis clubbing. B/L LE edema  Neuro: AAOx3, nonfocal  Skin: RLE erythema, edema without drainage or purulence.  Psych: Appropriate mood and affect  Data Review   Micro Results Recent Results (from the past 240 hour(s))  Culture, blood (routine x 2)     Status: None (Preliminary result)   Collection Time: 10/20/14  7:39 PM  Result Value Ref Range Status   Specimen Description BLOOD RIGHT ARM  Final   Special Requests BOTTLES DRAWN AEROBIC AND ANAEROBIC 10   Final   Culture NO GROWTH < 24 HOURS  Final   Report Status PENDING  Incomplete  Culture, blood (routine x 2)     Status: None (Preliminary result)   Collection Time: 10/20/14  7:47 PM  Result Value Ref Range Status   Specimen Description BLOOD RIGHT HAND  Final   Special Requests BOTTLES DRAWN AEROBIC ONLY 5CC  Final   Culture NO GROWTH < 24 HOURS  Final   Report Status PENDING  Incomplete    Radiology Reports Dg Chest 2 View  10/20/2014   CLINICAL DATA:  Shortness of breath for 1 week.  EXAM: CHEST  2 VIEW  COMPARISON:  10/31/2013  FINDINGS: The heart size and mediastinal contours are within normal limits. Both lungs are clear. The visualized skeletal structures are unremarkable.  IMPRESSION: No active cardiopulmonary disease.   Electronically Signed   By: Charlett Nose M.D.   On: 10/20/2014 14:01     CBC  Recent Labs Lab 10/20/14 1351 10/21/14 0537 10/22/14 0455  WBC 7.1 5.3 7.0  HGB 12.5* 10.9* 11.4*  HCT 36.5* 32.0* 33.7*  PLT 68* 56* 60*  MCV 106.7* 107.0* 109.1*  MCH 36.5* 36.5* 36.9*  MCHC 34.2 34.1 33.8  RDW 15.1 15.3 15.3    Chemistries   Recent Labs Lab 10/20/14 1351 10/20/14 1518 10/21/14 0537 10/22/14 0455  NA 133*  --  133* 131*  K 4.2  --  4.0 4.3  CL 103  --  103 100*  CO2 21*  --  24 26  GLUCOSE 161*  --  107* 103*  BUN 11  --  8 7  CREATININE 0.95  --  0.79 0.82  CALCIUM 8.1*  --  7.6* 7.5*  AST  --  77* 59* 55*  ALT  --  34 27 24  ALKPHOS  --  117 97 100  BILITOT  --  7.9* 6.5* 5.5*   ------------------------------------------------------------------------------------------------------------------ CrCl cannot be calculated (Unknown ideal weight.). ------------------------------------------------------------------------------------------------------------------ No results for input(s): HGBA1C in the last 72 hours. ------------------------------------------------------------------------------------------------------------------ No results for input(s): CHOL, HDL, LDLCALC, TRIG, CHOLHDL, LDLDIRECT in the last 72 hours. ------------------------------------------------------------------------------------------------------------------ No results for input(s): TSH, T4TOTAL, T3FREE, THYROIDAB in the last 72 hours.  Invalid input(s): FREET3 ------------------------------------------------------------------------------------------------------------------ No results for input(s): VITAMINB12, FOLATE, FERRITIN, TIBC, IRON, RETICCTPCT in the last 72 hours.  Coagulation profile No results for input(s): INR, PROTIME in the last 168 hours.  No results for input(s): DDIMER in the last 72 hours.  Cardiac Enzymes  Recent Labs Lab 10/20/14 1518  TROPONINI <0.03    ------------------------------------------------------------------------------------------------------------------ Invalid input(s): POCBNP    Sorah Falkenstein D.O. on 10/22/2014 at 10:20 AM  Between 7am to 7pm - Pager - [phone]  After 7pm go to [email or web address] - password [email or web address]  And look for the night coverage person covering for me after hours  [facility] Office  [phone]

## 2014-10-22 NOTE — Evaluation (Signed)
Physical Therapy Evaluation Patient Details Name: Gary [name] MRN: [id number] DOB: 13-Apr-1960 Today's Date: 10/22/2014   History of Present Illness  Pt is a 54 y/o M admitted w/ Rt LE cellulitis and swelling in Bil LEs.  His PMH includes Hepatitis, cirrhosis, SDH, seizure.  Clinical Impression  Pt admitted with above diagnosis. Pt currently with functional limitations due to the deficits listed below (see PT Problem List). [name] demonstrates instability when high level balance challenges introduced. He is currently limited by pain in Rt LE.  He has necessary equipment at home and intermittent assist available at d/c.  Pt will benefit from skilled PT, specifically w/ a focus on improved balance, to increase his independence and safety with mobility to allow discharge to the venue listed below.      Follow Up Recommendations Home health PT;Supervision - Intermittent    Equipment Recommendations  None recommended by PT    Recommendations for Other Services       Precautions / Restrictions Precautions Precautions: Fall Restrictions Weight Bearing Restrictions: No      Mobility  Bed Mobility Overal bed mobility: Modified Independent             General bed mobility comments: HOB slightly elevated.  Pt w/ increased time 2/2 pain but otherwise independent.  Transfers Overall transfer level: Needs assistance Equipment used: None Transfers: Sit to/from Stand Sit to Stand: Min guard         General transfer comment: Min guard for safety as pt demonstrates mild instability w/ sit>stand 2/2 pain.  Ambulation/Gait Ambulation/Gait assistance: Min guard Ambulation Distance (Feet): 250 Feet Assistive device: None Gait Pattern/deviations: Step-through pattern;Antalgic;Staggering left;Staggering right;Decreased stride length   Gait velocity interpretation: Below normal speed for age/gender General Gait Details: Pt walking flat footed and mild staggering Lt and Rt 2/2  pain in Rt LE. Min guard for safety.  Stairs            Wheelchair Mobility    Modified Rankin (Stroke Patients Only)       Balance Overall balance assessment: Needs assistance Sitting-balance support: No upper extremity supported;Feet supported Sitting balance-Leahy Scale: Good     Standing balance support: No upper extremity supported;During functional activity Standing balance-Leahy Scale: Fair                 High Level Balance Comments: Pt w/ mod instability when asked to march while ambulating Standardized Balance Assessment Standardized Balance Assessment : Dynamic Gait Index   Dynamic Gait Index Level Surface: Mild Impairment Change in Gait Speed: Mild Impairment Gait with Horizontal Head Turns: Mild Impairment Gait with Vertical Head Turns: Mild Impairment Gait and Pivot Turn: Mild Impairment Step Over Obstacle: Moderate Impairment Step Around Obstacles: Mild Impairment       Pertinent Vitals/Pain Pain Assessment: 0-10 Pain Score: 8  Pain Location: Rt foot up to Rt knee Pain Descriptors / Indicators: Aching;Constant;Tightness Pain Intervention(s): Limited activity within patient's tolerance;Monitored during session;Repositioned    Home Living Family/patient expects to be discharged to:: Private residence Living Arrangements: Children;Other relatives Available Help at Discharge: Family;Available PRN/intermittently Type of Home: House Home Access: Stairs to enter Entrance Stairs-Rails: None Entrance Stairs-Number of Steps: 1 Home Layout: One level Home Equipment: Walker - 4 wheels;Cane - single point (rollator) Additional Comments: Pt lives in his own home that he built in his son's backyard and will have assist available prn at d/c.    Prior Function Level of Independence: Independent with assistive device(s)  Comments: PTA pt using RW only when his pain was too great.  Denies any falls in the past year.     Hand Dominance         Extremity/Trunk Assessment   Upper Extremity Assessment: Overall WFL for tasks assessed           Lower Extremity Assessment: RLE deficits/detail;LLE deficits/detail RLE Deficits / Details: Bil LE edema from knee down LLE Deficits / Details: Bil LE edema from knee down     Communication   Communication: No difficulties  Cognition Arousal/Alertness: Awake/alert Behavior During Therapy: WFL for tasks assessed/performed Overall Cognitive Status: Within Functional Limits for tasks assessed                      General Comments General comments (skin integrity, edema, etc.): Pt demonstrated instability w/ high level balance challenges (see DGI above).  Pt acknowledges his balance deficits and is agreeable that he would benefit from HHPT.     Exercises        Assessment/Plan    PT Assessment Patient needs continued PT services  PT Diagnosis Difficulty walking;Abnormality of gait;Acute pain   PT Problem List Decreased strength;Decreased range of motion;Decreased activity tolerance;Decreased balance;Decreased mobility;Decreased knowledge of use of DME;Decreased safety awareness;Decreased knowledge of precautions;Pain;Decreased skin integrity  PT Treatment Interventions DME instruction;Gait training;Stair training;Functional mobility training;Therapeutic activities;Therapeutic exercise;Balance training;Neuromuscular re-education;Patient/family education;Modalities   PT Goals (Current goals can be found in the Care Plan section) Acute Rehab PT Goals Patient Stated Goal: to have his pain decrease and to return home when able PT Goal Formulation: With patient Time For Goal Achievement: 11/05/14 Potential to Achieve Goals: Good    Frequency Min 2X/week   Barriers to discharge        Co-evaluation               End of Session Equipment Utilized During Treatment: Gait belt Activity Tolerance: Patient tolerated treatment well;Patient limited by pain Patient  left: in chair;with call bell/phone within reach;with nursing/sitter in room Nurse Communication: Mobility status;Precautions         Time: 8657-8469 PT Time Calculation (min) (ACUTE ONLY): 22 min   Charges:   PT Evaluation $Initial PT Evaluation Tier I: 1 Procedure     PT G CodesMichail Jewels PT, DPT [phone] Pager: [phone] 10/22/2014, 3:18 PM

## 2014-10-23 NOTE — Progress Notes (Signed)
[facility]                                                                              Patient Demographics  Gary [name], is a 54 y.o. male, DOB - January 29, 1960, ZOX:[id number]  Admit date - 10/20/2014   Admitting Physician Jerald Kief, MD  Outpatient Primary MD for the patient is Rodman Key, FNP  LOS - 3   Chief Complaint  Patient presents with  . Leg Swelling  . Shortness of Breath      HPI on 10/20/2014 by Dr. Melonie Florida is a 54 y.o. male With a hx of hep c cirrhosis, hx of seizure d/o who presents with one week hx of worsening redness and tenderness of RLE. Pt states area began with pruritic lesion, which pt began to scratch with a brush. Area became more erythematous, edematous, and tender. Pt then presented to ED where pt found to have normal WBC and afebrile. LE dopplers neg for DVT. Pt started on empiric vanc in ED and hospitalist consulted for admission.  Assessment & Plan   Right lower extremely cellulitis -Patient received 1 dose of vancomycin in the emergency department -Cellulitis improving slowly -Cellulitis nonpurulent, continue IV cefazolin -Blood cultures show no growth to date -Continue pain control -PT recommended Home health, however, unable to provide to do medicaid -Patient would benefit from home health RN for disease management and teaching/observation -Right Lower extremity Doppler: no evidence of DVT, or baker's cyst  Hepatitis C cirrhosis -Patient follows up with hepatologist in [place] -Will continue to monitor LFTs and avoid hepatotoxic medications -Continue lactulose, rifaximin, propranolol  Chronic anemia with thrombocytopenia -Likely secondary to cirrhosis, will continue to monitor CBC closely -Hb stable, 11.4, platelets 60 -No signs of active bleeding  Seizure disorder -Continue Keppra  Code Status: Full  Family Communication: None at bedside  Disposition Plan: Admitted.  Will give an additional  24hrs of antibiotics and continue to monitor  Time Spent in minutes   30 minutes  Procedures  Right Lower extremity Doppler: no evidence of DVT, or baker's cyst  Consults   None  DVT Prophylaxis  SCDs  Lab Results  Component Value Date   PLT 60* 10/22/2014    Medications  Scheduled Meds: .  ceFAZolin (ANCEF) IV  2 g Intravenous 3 times per day  . lactulose  20 g Oral BID  . pantoprazole  40 mg Oral Daily  . propranolol  20 mg Oral BID  . rifaximin  550 mg Oral BID  . sodium chloride  3 mL Intravenous Q12H   Continuous Infusions:  PRN Meds:.dicyclomine, morphine injection, ondansetron **OR** ondansetron (ZOFRAN) IV  Antibiotics    Anti-infectives    Start     Dose/Rate Route Frequency Ordered Stop   10/20/14 2200  rifaximin (XIFAXAN) tablet 550 mg     550 mg Oral 2 times daily 10/20/14 1724     10/20/14 2200  ceFAZolin (ANCEF) IVPB 2 g/50 mL premix     2 g 100 mL/hr over 30 Minutes Intravenous 3 times per day 10/20/14 1843     10/20/14 1730  ceFAZolin (ANCEF) IVPB 2 g/50 mL premix  Status:  Discontinued     2 g 100 mL/hr over 30 Minutes Intravenous 3 times per day 10/20/14 1723 10/20/14 1842   10/20/14 1630  vancomycin (VANCOCIN) IVPB 1000 mg/200 mL premix     1,000 mg 200 mL/hr over 60 Minutes Intravenous  Once 10/20/14 1627 10/20/14 1819     Subjective:   Gary [name] seen and examined today.  Patient continues to complain of leg pain and swelling, but feels it has improved slightly.  Denieschest pain, shortness of breath, abdominal pain, nausea, vomiting, constipation.  Objective:   Filed Vitals:   10/22/14 1400 10/22/14 2127 10/23/14 0437 10/23/14 1006  BP: 120/80 105/66 100/54 107/77  Pulse: 58 62 62 53  Temp: 98.5 F (36.9 C) 99.1 F (37.3 C) 99.3 F (37.4 C) 97.8 F (36.6 C)  TempSrc: Oral Oral Oral Oral  Resp:  19 19 18   Height:      Weight:      SpO2: 98% 98% 98% 97%    Wt Readings from Last 3 Encounters:  10/22/14 99.4 kg (219 lb 2.2  oz)  10/31/13 92.8 kg (204 lb 9.4 oz)  10/28/13 91.899 kg (202 lb 9.6 oz)     Intake/Output Summary (Last 24 hours) at 10/23/14 1101 Last data filed at 10/22/14 1800  Gross per 24 hour  Intake    630 ml  Output      0 ml  Net    630 ml    Exam  General: Well developed, well nourished, NAD  HEENT: NCAT, Icteic Sclera, mucous membranes moist.   Cardiovascular: S1 S2 auscultated, no murmurs, RRR  Respiratory: Clear to auscultation   Abdomen: Soft, nontender, nondistended, + bowel sounds  Extremities: warm dry without cyanosis clubbing. B/L LE edema- improving  Neuro: AAOx3, nonfocal  Skin: RLE erythema (improving), edema without drainage or purulence.  Psych: Appropriate mood and affect, pleasant  Data Review   Micro Results Recent Results (from the past 240 hour(s))  Culture, blood (routine x 2)     Status: None (Preliminary result)   Collection Time: 10/20/14  7:39 PM  Result Value Ref Range Status   Specimen Description BLOOD RIGHT ARM  Final   Special Requests BOTTLES DRAWN AEROBIC AND ANAEROBIC 10   Final   Culture NO GROWTH 3 DAYS  Final   Report Status PENDING  Incomplete  Culture, blood (routine x 2)     Status: None (Preliminary result)   Collection Time: 10/20/14  7:47 PM  Result Value Ref Range Status   Specimen Description BLOOD RIGHT HAND  Final   Special Requests BOTTLES DRAWN AEROBIC ONLY 5CC  Final   Culture NO GROWTH 3 DAYS  Final   Report Status PENDING  Incomplete    Radiology Reports Dg Chest 2 View  10/20/2014   CLINICAL DATA:  Shortness of breath for 1 week.  EXAM: CHEST  2 VIEW  COMPARISON:  10/31/2013  FINDINGS: The heart size and mediastinal contours are within normal limits. Both lungs are clear. The visualized skeletal structures are unremarkable.  IMPRESSION: No active cardiopulmonary disease.   Electronically Signed   By: Charlett Nose M.D.   On: 10/20/2014 14:01    CBC  Recent Labs Lab 10/20/14 1351 10/21/14 0537 10/22/14 0455   WBC 7.1 5.3 7.0  HGB 12.5* 10.9* 11.4*  HCT 36.5* 32.0* 33.7*  PLT 68* 56* 60*  MCV 106.7* 107.0* 109.1*  MCH 36.5* 36.5* 36.9*  MCHC 34.2 34.1 33.8  RDW 15.1 15.3 15.3    Chemistries  Recent Labs Lab 10/20/14 1351 10/20/14 1518 10/21/14 0537 10/22/14 0455  NA 133*  --  133* 131*  K 4.2  --  4.0 4.3  CL 103  --  103 100*  CO2 21*  --  24 26  GLUCOSE 161*  --  107* 103*  BUN 11  --  8 7  CREATININE 0.95  --  0.79 0.82  CALCIUM 8.1*  --  7.6* 7.5*  AST  --  77* 59* 55*  ALT  --  34 27 24  ALKPHOS  --  117 97 100  BILITOT  --  7.9* 6.5* 5.5*   ------------------------------------------------------------------------------------------------------------------ estimated creatinine clearance is 120.4 mL/min (by C-G formula based on Cr of 0.82). ------------------------------------------------------------------------------------------------------------------ No results for input(s): HGBA1C in the last 72 hours. ------------------------------------------------------------------------------------------------------------------ No results for input(s): CHOL, HDL, LDLCALC, TRIG, CHOLHDL, LDLDIRECT in the last 72 hours. ------------------------------------------------------------------------------------------------------------------ No results for input(s): TSH, T4TOTAL, T3FREE, THYROIDAB in the last 72 hours.  Invalid input(s): FREET3 ------------------------------------------------------------------------------------------------------------------ No results for input(s): VITAMINB12, FOLATE, FERRITIN, TIBC, IRON, RETICCTPCT in the last 72 hours.  Coagulation profile No results for input(s): INR, PROTIME in the last 168 hours.  No results for input(s): DDIMER in the last 72 hours.  Cardiac Enzymes  Recent Labs Lab 10/20/14 1518  TROPONINI <0.03   ------------------------------------------------------------------------------------------------------------------ Invalid  input(s): POCBNP    Camdyn Beske D.O. on 10/23/2014 at 11:01 AM  Between 7am to 7pm - Pager - [phone]  After 7pm go to [email or web address] - password [email or web address]  And look for the night coverage person covering for me after hours  [facility] Office  [phone]

## 2014-10-23 NOTE — Care Management Note (Signed)
Case Management Note  Patient Details  Name: Gary [name] MRN: [id number] Date of Birth: 11-08-1960  Subjective/Objective:                    Action/Plan:  PT recommending home health PT , patient has Medicaid . Patient has non qualifying Medicaid DX for HHPT . Medicaid will not cover HHPT  Expected Discharge Date:                  Expected Discharge Plan:  Home/Self Care  In-House Referral:     Discharge planning Services     Post Acute Care Choice:    Choice offered to:     DME Arranged:    DME Agency:     HH Arranged:    HH Agency:     Status of Service:  In process, will continue to follow  Medicare Important Message Given:    Date Medicare IM Given:    Medicare IM give by:    Date Additional Medicare IM Given:    Additional Medicare Important Message give by:     If discussed at Long Length of Stay Meetings, dates discussed:    Additional Comments:  Kingsley Plan, RN 10/23/2014, 7:26 AM

## 2014-10-23 NOTE — Care Management Note (Addendum)
Case Management Note  Patient Details  Name: Gary [name] MRN: [id number] Date of Birth: February 09, 1960  Subjective/Objective:                    Action/Plan:  [facility] , explained below . Patient can be seen there October 17 at 3:30 pm . Patient and [facility] aware .  [facility] called patient's PCP , she has changed locations and was told patient's new PCP through Medicaid is Dr Jeannetta Nap , who declines to sign home health orders .   Explained same to patient . In past [facility] has taken medicaid patient's while they are changing PCP through [place] . Patient agreeable to go to [facility] . [facility] currently closed until 1400 , will call at that time to see if patient can be seen there.  Face sheet confirmed with patient. Discharge planned for 10-24-14 . [facility] aware. Expected Discharge Date:                  Expected Discharge Plan:  Home w Home Health Services  In-House Referral:     Discharge planning Services  CM Consult  Post Acute Care Choice:  Home Health Choice offered to:  Patient  DME Arranged:    DME Agency:     HH Arranged:  RN, Social Work [employer] Agency:  [facility]  Status of Service:  Completed, signed off  Medicare Important Message Given:    Date Medicare IM Given:    Medicare IM give by:    Date Additional Medicare IM Given:    Additional Medicare Important Message give by:     If discussed at Long Length of Stay Meetings, dates discussed:    Additional Comments:  Kingsley Plan, RN 10/23/2014, 11:28 AM

## 2014-10-24 MED ORDER — CEFUROXIME AXETIL 500 MG PO TABS: 500.0000 mg | ORAL_TABLET | Freq: Two times a day (BID) | ORAL | Status: DC

## 2014-10-24 NOTE — Discharge Instructions (Signed)

## 2014-10-24 NOTE — Progress Notes (Signed)
CM received call from RN stating pt will be going home.  Pt set up with [facility] for RN, SW.  HHPT added on per recc and MD order.  CM notified [facility] rep, Tiffany of discharge.  Pt states he has a rolling walker at home and does not need any other DME.  No other CM needs were communicated.

## 2014-10-24 NOTE — Discharge Summary (Signed)
Physician Discharge Summary  Gary [name] ZOX:[id number] DOB: 20-Nov-1960 DOA: 10/20/2014  PCP: Rodman Key, FNP  Admit date: 10/20/2014 Discharge date: 10/24/2014  Time spent: 45 minutes  Recommendations for Outpatient Follow-up:  Patient will be discharged to home with home health RN and social work.  Patient will need to follow up with primary care provider within one week of discharge, appointment made.  Patient should continue medications as prescribed.  Patient should follow a low sodium diet.   Discharge Diagnoses:  Right lower extremity cellulitis Hepatitis C cirrhosis Chronic anemia and thrombocytopenia Seizure disorder   Discharge Condition: Stable  Diet recommendation: Low sodium diet  Filed Weights   10/22/14 1300  Weight: 99.4 kg (219 lb 2.2 oz)    History of present illness:  on 10/20/2014 by Dr. Melonie Florida is a 54 y.o. male With a hx of hep c cirrhosis, hx of seizure d/o who presents with one week hx of worsening redness and tenderness of RLE. Pt states area began with pruritic lesion, which pt began to scratch with a brush. Area became more erythematous, edematous, and tender. Pt then presented to ED where pt found to have normal WBC and afebrile. LE dopplers neg for DVT. Pt started on empiric vanc in ED and hospitalist consulted for admission.  Hospital Course:  Right lower extremity cellulitis -Patient received 1 dose of vancomycin in the emergency department -Cellulitis improving slowly -Cellulitis nonpurulent, was placed on IV cefazolin, will discharge on Ceftin  -Blood cultures show no growth to date -Continue pain control -PT recommended Home health, however, unable to provide to do medicaid -Patient would benefit from home health RN for disease management and teaching/observation -Right Lower extremity Doppler: no evidence of DVT, or baker's cyst  Hepatitis C cirrhosis -Patient follows up with hepatologist in [place] -LFTs  stable -avoid hepatotoxic medications -Continue lactulose, rifaximin, propranolol  Chronic anemia with thrombocytopenia -Likely secondary to cirrhosis, will continue to monitor CBC closely -Hb stable, 11.4, platelets 60 -No signs of active bleeding  Seizure disorder -Continue Keppra  Procedures  Right Lower extremity Doppler: no evidence of DVT, or baker's cyst  Consults  None  Discharge Exam: Filed Vitals:   10/24/14 0545  BP: 109/64  Pulse: 64  Temp: 98.9 F (37.2 C)  Resp: 19   Exam  General: Well developed, well nourished, NAD  HEENT: NCAT, Icteic Sclera, mucous membranes moist.   Cardiovascular: S1 S2 auscultated, no murmurs, RRR  Respiratory: Clear to auscultation bilaterally  Abdomen: Soft, nontender, nondistended, + bowel sounds  Extremities: warm dry without cyanosis clubbing. B/L LE edema- improving  Neuro: AAOx3, nonfocal  Skin: RLE erythema (improving), edema without drainage or purulence.  Psych: Appropriate mood and affect, pleasant  Discharge Instructions      Discharge Instructions    Discharge instructions    Complete by:  As directed   Patient will be discharged to home with home health RN and social work.  Patient will need to follow up with primary care provider within one week of discharge, appointment made.  Patient should continue medications as prescribed.  Patient should follow a low sodium diet.            Medication List    TAKE these medications        acetaminophen 500 MG tablet  Commonly known as:  TYLENOL  Take 1,000 mg by mouth every 6 (six) hours as needed for moderate pain.     cefUROXime 500 MG tablet  Commonly  known as:  CEFTIN  Take 1 tablet (500 mg total) by mouth 2 (two) times daily with a meal.     DEXILANT 60 MG capsule  Generic drug:  dexlansoprazole  Take 60 mg by mouth daily.     lactulose 10 GM/15ML solution  Commonly known as:  CHRONULAC  Take 10 g by mouth 2 (two) times daily.      propranolol 20 MG tablet  Commonly known as:  INDERAL  Take 20 mg by mouth 2 (two) times daily.     XIFAXAN 550 MG Tabs tablet  Generic drug:  rifaximin  Take 550 mg by mouth 2 (two) times daily.       No Known Allergies Follow-up Information    Follow up with [facility]    .   Why:  November 02, 2014 at 3 30 pm    Contact information:   [address] [place] [address] [phone]       The results of significant diagnostics from this hospitalization (including imaging, microbiology, ancillary and laboratory) are listed below for reference.    Significant Diagnostic Studies: Dg Chest 2 View  10/20/2014   CLINICAL DATA:  Shortness of breath for 1 week.  EXAM: CHEST  2 VIEW  COMPARISON:  10/31/2013  FINDINGS: The heart size and mediastinal contours are within normal limits. Both lungs are clear. The visualized skeletal structures are unremarkable.  IMPRESSION: No active cardiopulmonary disease.   Electronically Signed   By: Charlett Nose M.D.   On: 10/20/2014 14:01    Microbiology: Recent Results (from the past 240 hour(s))  Culture, blood (routine x 2)     Status: None (Preliminary result)   Collection Time: 10/20/14  7:39 PM  Result Value Ref Range Status   Specimen Description BLOOD RIGHT ARM  Final   Special Requests BOTTLES DRAWN AEROBIC AND ANAEROBIC 10   Final   Culture NO GROWTH 4 DAYS  Final   Report Status PENDING  Incomplete  Culture, blood (routine x 2)     Status: None (Preliminary result)   Collection Time: 10/20/14  7:47 PM  Result Value Ref Range Status   Specimen Description BLOOD RIGHT HAND  Final   Special Requests BOTTLES DRAWN AEROBIC ONLY 5CC  Final   Culture NO GROWTH 4 DAYS  Final   Report Status PENDING  Incomplete     Labs: Basic Metabolic Panel:  Recent Labs Lab 10/20/14 1351 10/21/14 0537 10/22/14 0455  NA 133* 133* 131*  K 4.2 4.0 4.3  CL 103 103 100*  CO2 21* 24 26  GLUCOSE  161* 107* 103*  BUN CREATININE 0.95 0.79 0.82  CALCIUM 8.1* 7.6* 7.5*   Liver Function Tests:  Recent Labs Lab 10/20/14 1518 10/21/14 0537 10/22/14 0455  AST 77* 59* 55*  ALT 34 27 24  ALKPHOS 117 97 100  BILITOT 7.9* 6.5* 5.5*  PROT 5.7* 4.8* 4.8*  ALBUMIN 2.1* 1.7* 1.7*   No results for input(s): LIPASE, AMYLASE in the last 168 hours. No results for input(s): AMMONIA in the last 168 hours. CBC:  Recent Labs Lab 10/20/14 1351 10/21/14 0537 10/22/14 0455  WBC 7.1 5.3 7.0  HGB 12.5* 10.9* 11.4*  HCT 36.5* 32.0* 33.7*  MCV 106.7* 107.0* 109.1*  PLT 68* 56* 60*   Cardiac Enzymes:  Recent Labs Lab 10/20/14 1518  TROPONINI <0.03   BNP: BNP (last 3 results)  Recent Labs  10/20/14 1518  BNP 191.0*    ProBNP (last 3 results) No results for input(s): PROBNP in the last 8760 hours.  CBG:  Recent Labs Lab 10/20/14 2130 10/21/14 0750 10/21/14 1215  GLUCAP 112* 107* 109*       Signed:  Ambrie Carte  [facility] 10/24/2014, 9:35 AM

## 2014-10-25 LAB — CULTURE, BLOOD (ROUTINE X 2)
CULTURE: NO GROWTH
Culture: NO GROWTH

## 2014-11-02 ENCOUNTER — Inpatient Hospital Stay: Payer: Medicaid Other | Admitting: Family Medicine

## 2014-11-05 ENCOUNTER — Encounter (HOSPITAL_COMMUNITY): Payer: Self-pay

## 2014-11-05 ENCOUNTER — Inpatient Hospital Stay (HOSPITAL_COMMUNITY)
Admission: EM | Admit: 2014-11-05 | Discharge: 2014-11-10 | DRG: 603 | Disposition: A | Discharge status: Home / Self Care | Payer: Medicaid Other | Attending: Internal Medicine | Admitting: Internal Medicine

## 2014-11-05 ENCOUNTER — Emergency Department (HOSPITAL_COMMUNITY): Payer: Medicaid Other

## 2014-11-05 DIAGNOSIS — R609 Edema, unspecified: Secondary | ICD-10-CM | POA: Diagnosis not present

## 2014-11-05 DIAGNOSIS — F329 Major depressive disorder, single episode, unspecified: Secondary | ICD-10-CM | POA: Diagnosis present

## 2014-11-05 DIAGNOSIS — K7460 Unspecified cirrhosis of liver: Secondary | ICD-10-CM | POA: Diagnosis present

## 2014-11-05 DIAGNOSIS — N5089 Other specified disorders of the male genital organs: Secondary | ICD-10-CM | POA: Diagnosis present

## 2014-11-05 DIAGNOSIS — N183 Chronic kidney disease, stage 3 (moderate): Secondary | ICD-10-CM | POA: Diagnosis present

## 2014-11-05 DIAGNOSIS — K219 Gastro-esophageal reflux disease without esophagitis: Secondary | ICD-10-CM | POA: Diagnosis present

## 2014-11-05 DIAGNOSIS — E872 Acidosis, unspecified: Secondary | ICD-10-CM

## 2014-11-05 DIAGNOSIS — L03115 Cellulitis of right lower limb: Secondary | ICD-10-CM | POA: Diagnosis present

## 2014-11-05 DIAGNOSIS — D696 Thrombocytopenia, unspecified: Secondary | ICD-10-CM

## 2014-11-05 DIAGNOSIS — K766 Portal hypertension: Secondary | ICD-10-CM | POA: Diagnosis present

## 2014-11-05 DIAGNOSIS — I8311 Varicose veins of right lower extremity with inflammation: Secondary | ICD-10-CM | POA: Diagnosis not present

## 2014-11-05 DIAGNOSIS — L0390 Cellulitis, unspecified: Secondary | ICD-10-CM | POA: Diagnosis present

## 2014-11-05 DIAGNOSIS — I878 Other specified disorders of veins: Secondary | ICD-10-CM | POA: Diagnosis present

## 2014-11-05 DIAGNOSIS — F419 Anxiety disorder, unspecified: Secondary | ICD-10-CM | POA: Diagnosis present

## 2014-11-05 DIAGNOSIS — N179 Acute kidney failure, unspecified: Secondary | ICD-10-CM | POA: Diagnosis present

## 2014-11-05 DIAGNOSIS — B1920 Unspecified viral hepatitis C without hepatic coma: Secondary | ICD-10-CM | POA: Diagnosis present

## 2014-11-05 DIAGNOSIS — I8312 Varicose veins of left lower extremity with inflammation: Secondary | ICD-10-CM | POA: Diagnosis not present

## 2014-11-05 DIAGNOSIS — Z79899 Other long term (current) drug therapy: Secondary | ICD-10-CM | POA: Diagnosis not present

## 2014-11-05 DIAGNOSIS — R600 Localized edema: Secondary | ICD-10-CM

## 2014-11-05 DIAGNOSIS — B182 Chronic viral hepatitis C: Secondary | ICD-10-CM | POA: Diagnosis not present

## 2014-11-05 DIAGNOSIS — I872 Venous insufficiency (chronic) (peripheral): Secondary | ICD-10-CM | POA: Diagnosis present

## 2014-11-05 DIAGNOSIS — R188 Other ascites: Secondary | ICD-10-CM | POA: Diagnosis present

## 2014-11-05 HISTORY — DX: Calculus of kidney: N20.0

## 2014-11-05 HISTORY — DX: Gastrointestinal hemorrhage, unspecified: K92.2

## 2014-11-05 HISTORY — DX: Unspecified viral hepatitis C without hepatic coma: B19.20

## 2014-11-05 HISTORY — DX: Personal history of other medical treatment: Z92.89

## 2014-11-05 HISTORY — DX: Pneumonia, unspecified organism: J18.9

## 2014-11-05 HISTORY — DX: Anemia, unspecified: D64.9

## 2014-11-05 LAB — COMPREHENSIVE METABOLIC PANEL
ALBUMIN: 1.8 g/dL — AB (ref 3.5–5.0)
ALK PHOS: 70 U/L (ref 38–126)
ALT: 18 U/L (ref 17–63)
AST: 58 U/L — AB (ref 15–41)
Anion gap: 7 (ref 5–15)
BILIRUBIN TOTAL: 6.4 mg/dL — AB (ref 0.3–1.2)
CALCIUM: 8.1 mg/dL — AB (ref 8.9–10.3)
CO2: 23 mmol/L (ref 22–32)
Chloride: 105 mmol/L (ref 101–111)
Creatinine, Ser: 0.82 mg/dL (ref 0.61–1.24)
GFR calc Af Amer: >60 mL/min (ref >60)
GLUCOSE: 89 mg/dL (ref 65–99)
Potassium: 4.2 mmol/L (ref 3.5–5.1)
Sodium: 135 mmol/L (ref 135–145)
TOTAL PROTEIN: 4.9 g/dL — AB (ref 6.5–8.1)

## 2014-11-05 LAB — CBC WITH DIFFERENTIAL/PLATELET
BASOS ABS: 0.1 10*3/uL (ref 0.0–0.1)
Basophils Relative: 2 %
Eosinophils Absolute: 0.3 10*3/uL (ref 0.0–0.7)
Eosinophils Relative: 7 %
HEMATOCRIT: 33.9 % — AB (ref 39.0–52.0)
HEMOGLOBIN: 11.6 g/dL — AB (ref 13.0–17.0)
LYMPHS PCT: 23 %
Lymphs Abs: 0.9 10*3/uL (ref 0.7–4.0)
MCH: 35.7 pg — ABNORMAL HIGH (ref 26.0–34.0)
MCHC: 34.2 g/dL (ref 30.0–36.0)
MCV: 104.3 fL — AB (ref 78.0–100.0)
MONO ABS: 0.7 10*3/uL (ref 0.1–1.0)
MONOS PCT: 18 %
NEUTROS ABS: 2 10*3/uL (ref 1.7–7.7)
Neutrophils Relative %: 51 %
Platelets: 67 10*3/uL — ABNORMAL LOW (ref 150–400)
RBC: 3.25 MIL/uL — ABNORMAL LOW (ref 4.22–5.81)
RDW: 14.7 % (ref 11.5–15.5)
WBC: 4 10*3/uL (ref 4.0–10.5)

## 2014-11-05 LAB — LACTIC ACID, PLASMA: Lactic Acid, Venous: 1.9 mmol/L (ref 0.5–2.0)

## 2014-11-05 LAB — BRAIN NATRIURETIC PEPTIDE: B NATRIURETIC PEPTIDE 5: 65.5 pg/mL (ref 0.0–100.0)

## 2014-11-05 LAB — I-STAT CG4 LACTIC ACID, ED: Lactic Acid, Venous: 3.26 mmol/L (ref 0.5–2.0)

## 2014-11-05 LAB — RAPID URINE DRUG SCREEN, HOSP PERFORMED
AMPHETAMINES: NOT DETECTED
BARBITURATES: NOT DETECTED
BENZODIAZEPINES: NOT DETECTED
Cocaine: NOT DETECTED
Opiates: NOT DETECTED
TETRAHYDROCANNABINOL: NOT DETECTED

## 2014-11-05 LAB — ETHANOL: Alcohol, Ethyl (B): <5 mg/dL (ref <5)

## 2014-11-05 MED ORDER — ACETAMINOPHEN 325 MG PO TABS
650.0000 mg | ORAL_TABLET | Freq: Four times a day (QID) | ORAL | Status: DC | PRN
  Administered 2014-11-06: 650 mg via ORAL
  Filled 2014-11-05: qty 2

## 2014-11-05 MED ORDER — FUROSEMIDE 10 MG/ML IJ SOLN
40.0000 mg | Freq: Two times a day (BID) | INTRAMUSCULAR | Status: DC
  Administered 2014-11-05 – 2014-11-07 (×4): 40 mg via INTRAVENOUS
  Filled 2014-11-05 (×3): qty 4

## 2014-11-05 MED ORDER — ONDANSETRON HCL 4 MG/2ML IJ SOLN: 4.0000 mg | Freq: Four times a day (QID) | INTRAMUSCULAR | Status: DC | PRN

## 2014-11-05 MED ORDER — ALUM & MAG HYDROXIDE-SIMETH 200-200-20 MG/5ML PO SUSP: 30.0000 mL | Freq: Four times a day (QID) | ORAL | Status: DC | PRN

## 2014-11-05 MED ORDER — LACTULOSE 10 GM/15ML PO SOLN
10.0000 g | Freq: Two times a day (BID) | ORAL | Status: DC
  Administered 2014-11-05 – 2014-11-07 (×6): 10 g via ORAL
  Filled 2014-11-05 (×6): qty 15

## 2014-11-05 MED ORDER — ACETAMINOPHEN 650 MG RE SUPP: 650.0000 mg | Freq: Four times a day (QID) | RECTAL | Status: DC | PRN

## 2014-11-05 MED ORDER — VANCOMYCIN HCL IN DEXTROSE 1-5 GM/200ML-% IV SOLN
1000.0000 mg | Freq: Three times a day (TID) | INTRAVENOUS | Status: DC
  Administered 2014-11-05 – 2014-11-06 (×3): 1000 mg via INTRAVENOUS
  Filled 2014-11-05 (×5): qty 200

## 2014-11-05 MED ORDER — SODIUM CHLORIDE 0.9 % IJ SOLN
3.0000 mL | Freq: Two times a day (BID) | INTRAMUSCULAR | Status: DC
  Administered 2014-11-05 – 2014-11-06 (×3): 3 mL via INTRAVENOUS

## 2014-11-05 MED ORDER — PROPRANOLOL HCL 20 MG PO TABS
20.0000 mg | ORAL_TABLET | Freq: Two times a day (BID) | ORAL | Status: DC
  Administered 2014-11-05 – 2014-11-07 (×3): 20 mg via ORAL
  Filled 2014-11-05 (×7): qty 1

## 2014-11-05 MED ORDER — PANTOPRAZOLE SODIUM 40 MG PO TBEC
40.0000 mg | DELAYED_RELEASE_TABLET | Freq: Every day | ORAL | Status: DC
  Administered 2014-11-05 – 2014-11-10 (×6): 40 mg via ORAL
  Filled 2014-11-05 (×6): qty 1

## 2014-11-05 MED ORDER — RIFAXIMIN 550 MG PO TABS
550.0000 mg | ORAL_TABLET | Freq: Two times a day (BID) | ORAL | Status: DC
  Administered 2014-11-05 – 2014-11-10 (×10): 550 mg via ORAL
  Filled 2014-11-05 (×10): qty 1

## 2014-11-05 MED ORDER — VANCOMYCIN HCL IN DEXTROSE 1-5 GM/200ML-% IV SOLN
1000.0000 mg | Freq: Once | INTRAVENOUS | Status: DC
  Filled 2014-11-05: qty 200

## 2014-11-05 MED ORDER — SODIUM CHLORIDE 0.9 % IV SOLN: 250.0000 mL | INTRAVENOUS | Status: DC | PRN

## 2014-11-05 MED ORDER — ONDANSETRON HCL 4 MG PO TABS: 4.0000 mg | ORAL_TABLET | Freq: Four times a day (QID) | ORAL | Status: DC | PRN

## 2014-11-05 MED ORDER — OXYCODONE-ACETAMINOPHEN 5-325 MG PO TABS
2.0000 | ORAL_TABLET | Freq: Once | ORAL | Status: AC
Start: 2014-11-05 — End: 2014-11-05
  Administered 2014-11-05: 2 via ORAL
  Filled 2014-11-05: qty 2

## 2014-11-05 MED ORDER — ENOXAPARIN SODIUM 40 MG/0.4ML ~~LOC~~ SOLN
40.0000 mg | SUBCUTANEOUS | Status: DC
  Administered 2014-11-05 – 2014-11-10 (×6): 40 mg via SUBCUTANEOUS
  Filled 2014-11-05 (×6): qty 0.4

## 2014-11-05 MED ORDER — HYDROCODONE-ACETAMINOPHEN 5-325 MG PO TABS
1.0000 | ORAL_TABLET | Freq: Four times a day (QID) | ORAL | Status: DC | PRN
  Administered 2014-11-05 – 2014-11-10 (×5): 1 via ORAL
  Filled 2014-11-05 (×5): qty 1

## 2014-11-05 MED ORDER — SODIUM CHLORIDE 0.9 % IJ SOLN: 3.0000 mL | INTRAMUSCULAR | Status: DC | PRN

## 2014-11-05 NOTE — H&P (Addendum)
[facility] History and Physical  Gary CrossRandall S Cudd ZOX:[id number] DOB: 06/07/1960 DOA: 11/05/2014   PCP: Rodman KeyHarrison, Dawn S, FNP    Chief Complaint: Swelling of both legs with pain and redness  HPI: Gary [name] is a 54 y.o. male with past medical history of hep C and cirrhosis who was admitted recently for right leg cellulitis. He was given IV vancomycin and transitioned to Ancef and discharged on Ceftin. Patient states that both of his legs have been swollen for a while now however,  the swelling did improve during his prior admission. He states that he still has not finished his course of antibiotics and his right leg has began to swell again. His left leg has always been swollen but now the top of his left foot is becoming red and painful. The redness in his right leg improved slightly but never resolved completely. Both legs have been hurting quite a bit.The patient has not had any fevers or chills.   General: The patient denies anorexia, fever, weight loss Cardiac: Denies chest pain, syncope, palpitations, + pedal edema for a little over a month now Respiratory: Denies cough, shortness of breath, wheezing GI: Denies severe indigestion/heartburn, abdominal pain, nausea, vomiting, diarrhea and constipation GU: Denies hematuria, incontinence, dysuria  Musculoskeletal: Denies arthritis  Skin: Denies suspicious skin lesions Neurologic: Denies focal weakness or numbness, change in vision Psychiatry: + depression & anxiety. Hematologic: + bruising or bleeding  All other systems reviewed and found to be negative.  Past Medical History  Diagnosis Date  . Hepatitis   . Cirrhosis (HCC)   . SDH (subdural hematoma) (HCC)   . Seizure [facility](HCC)     Past Surgical History  Procedure Laterality Date  . Craniotomy Right 10/21/2013    Procedure: CRANIOTOMY HEMATOMA EVACUATION SUBDURAL;  Surgeon: Maeola HarmanJoseph Stern, MD;  Location: [facility];  Service: Neurosurgery;  Laterality: Right;     Social History: does not smoke cigarettes, drinks on occasion  Lives at home alone.  On disability for hepatitis cirrhosis.   No Known Allergies  Family history:   Family History  Problem Relation Age of Onset  . Cirrhosis Neg Hx   . Seizures Neg Hx       Prior to Admission medications   Medication Sig Start Date End Date Taking? Authorizing Provider  acetaminophen (TYLENOL) 500 MG tablet Take 1,000 mg by mouth every 6 (six) hours as needed for moderate pain.   Yes Historical Provider, MD  cefUROXime (CEFTIN) 500 MG tablet Take 1 tablet (500 mg total) by mouth 2 (two) times daily with a meal. 10/24/14  Yes Maryann Mikhail, DO  dexlansoprazole (DEXILANT) 60 MG capsule Take 60 mg by mouth daily.   Yes Historical Provider, MD  lactulose (CHRONULAC) 10 GM/15ML solution Take 10 g by mouth 2 (two) times daily.    Yes Historical Provider, MD  propranolol (INDERAL) 20 MG tablet Take 20 mg by mouth 2 (two) times daily.   Yes Historical Provider, MD  rifaximin (XIFAXAN) 550 MG TABS tablet Take 550 mg by mouth 2 (two) times daily.   Yes Historical Provider, MD     Physical Exam: Filed Vitals:   11/05/14 0945 11/05/14 1041 11/05/14 1100 11/05/14 1130  BP: 119/64 117/69 116/73 112/69  Pulse: 64 72 86 77  Temp:      TempSrc:      Resp: 14 20    Height:      Weight:      SpO2: 98% 99% 95% 95%  General: Middle-aged male laying in bed in no acute distress HEENT: Normocephalic and Atraumatic, Mucous membranes pink                PERRLA; EOM intact; No scleral icterus,                 Nares: Patent, Oropharynx: Clear, Fair Dentition                 Neck: FROM, no cervical lymphadenopathy, thyromegaly, carotid bruit or JVD;  Breasts: deferred CHEST WALL: No tenderness  CHEST: Normal respiration, clear to auscultation bilaterally  HEART: Regular rate and rhythm; no murmurs rubs or gallops  BACK: No kyphosis or scoliosis; no CVA tenderness  GI: Positive Bowel Sounds, soft,  non-tender; no masses, moderately distended and dull to percussion Rectal Exam: deferred MSK: No cyanosis, clubbing, +3 pitting edema in bilateral lower extremities extending up to his thighs, chronic venous stasis changes in his right leg. Erythema on dorsum of left foot. Both legs equally tender on exam (even the areas that are not erythematous). Genitalia: not examined  SKIN:  no rash or ulceration  CNS: Alert and Oriented x 4, Nonfocal exam, CN 2-12 intact  Labs on Admission:  Basic Metabolic Panel:  Recent Labs Lab 11/05/14 0900  NA 135  K 4.2  CL 105  CO2 23  GLUCOSE 89  BUN <5*  CREATININE 0.82  CALCIUM 8.1*   Liver Function Tests:  Recent Labs Lab 11/05/14 0900  AST 58*  ALT 18  ALKPHOS 70  BILITOT 6.4*  PROT 4.9*  ALBUMIN 1.8*   No results for input(s): LIPASE, AMYLASE in the last 168 hours. No results for input(s): AMMONIA in the last 168 hours. CBC:  Recent Labs Lab 11/05/14 0900  WBC 4.0  NEUTROABS 2.0  HGB 11.6*  HCT 33.9*  MCV 104.3*  PLT 67*   Cardiac Enzymes: No results for input(s): CKTOTAL, CKMB, CKMBINDEX, TROPONINI in the last 168 hours.  BNP (last 3 results)  Recent Labs  10/20/14 1518 11/05/14 0900  BNP 191.0* 65.5    ProBNP (last 3 results) No results for input(s): PROBNP in the last 8760 hours.  CBG: No results for input(s): GLUCAP in the last 168 hours.  Radiological Exams on Admission: Dg Chest 2 View  11/05/2014  CLINICAL DATA:  Leg swelling EXAM: CHEST  2 VIEW COMPARISON:  10/20/2014 FINDINGS: No cardiomegaly or failure. The lungs are clear. Negative aortic and hilar contours. IMPRESSION: Negative chest.  No cardiomegaly or failure. Electronically Signed   By: Marnee Spring M.D.   On: 11/05/2014 10:22    EKG: Independently reviewed. 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

## 2014-11-05 NOTE — Progress Notes (Signed)
[facility]  Patient Status: Active (receiving services up to time of hospitalization)  [facility] is providing the following services: RN  If patient discharges after hours, please call [phone].   Kizzie FurnishDonna Fellmy 11/05/2014, 2:26 PM

## 2014-11-05 NOTE — ED Notes (Signed)
Vancomycin to be held at this time per Admitting MD.

## 2014-11-05 NOTE — ED Provider Notes (Signed)
S: Gary [name] is a 54 y.o. male presents to the ED with bilateral leg swelling and redness with pain in the bilateral legs from the waist down.  Pt reports persistence of redness to the right leg and development of redness to the left leg.  Pt reports his pain is a 10/10.  Pt reports taking Lasix as described.  Pt reports he drinks EtOH regularly and has a hx of cirrhosis.  Pt was admitted on 10/20/14 for the same and d/c home with ceftin which he reports taking.    O:  General: Awake  HEENT: Atraumatic, scleral icterus   Resp: Normal effort  Cardiac: RRR Abd: Nondistended, soft, nontender, no anasarca  Neuro:No focal weakness MSK: 3+ pitting edema GU: Pitting edema extends into the scrotum Skin: Jaundiced; erythema, increased warmth and induration of the right lower leg extending from the forefoot to just below the knee with tenderness to palpation   EKG Interpretation  Date/Time:  Thursday November 05 2014 09:44:35 EDT Ventricular Rate:  67 PR Interval:  61 QRS Duration: 98 QT Interval:  457 QTC Calculation: 482 R Axis:   47 Text Interpretation:  Sinus rhythm Short PR interval Consider right atrial enlargement Low voltage, precordial leads Borderline prolonged QT interval Baseline wander in lead(s) V3 V4 V5 V6 no acute ischemia. No significant change since last tracing Confirmed by Kandis Mannan ([id number]) on 11/05/2014 10:17:02 AM       Results for orders placed or performed during the hospital encounter of 11/05/14  CBC with Differential  Result Value Ref Range   WBC 4.0 4.0 - 10.5 K/uL   RBC 3.25 (L) 4.22 - 5.81 MIL/uL   Hemoglobin 11.6 (L) 13.0 - 17.0 g/dL   HCT 78.4 (L) 69.6 - 29.5 %   MCV 104.3 (H) 78.0 - 100.0 fL   MCH 35.7 (H) 26.0 - 34.0 pg   MCHC 34.2 30.0 - 36.0 g/dL   RDW 28.4 13.2 - 44.0 %   Platelets 67 (L) 150 - 400 K/uL   Neutrophils Relative % 51 %   Neutro Abs 2.0 1.7 - 7.7 K/uL   Lymphocytes Relative 23 %   Lymphs Abs 0.9 0.7 - 4.0 K/uL   Monocytes Relative 18 %   Monocytes Absolute 0.7 0.1 - 1.0 K/uL   Eosinophils Relative 7 %   Eosinophils Absolute 0.3 0.0 - 0.7 K/uL   Basophils Relative 2 %   Basophils Absolute 0.1 0.0 - 0.1 K/uL  Comprehensive metabolic panel  Result Value Ref Range   Sodium 135 135 - 145 mmol/L   Potassium 4.2 3.5 - 5.1 mmol/L   Chloride 105 101 - 111 mmol/L   CO2 23 22 - 32 mmol/L   Glucose, Bld 89 65 - 99 mg/dL   BUN <5 (L) 6 - 20 mg/dL   Creatinine, Ser 1.02 0.61 - 1.24 mg/dL   Calcium 8.1 (L) 8.9 - 10.3 mg/dL   Total Protein 4.9 (L) 6.5 - 8.1 g/dL   Albumin 1.8 (L) 3.5 - 5.0 g/dL   AST 58 (H) 15 - 41 U/L   ALT 18 17 - 63 U/L   Alkaline Phosphatase 70 38 - 126 U/L   Total Bilirubin 6.4 (H) 0.3 - 1.2 mg/dL   GFR calc non Af Amer >60 >60 mL/min   GFR calc Af Amer >60 >60 mL/min   Anion gap 7 5 - 15  Brain natriuretic peptide  Result Value Ref Range   B Natriuretic Peptide 65.5 0.0 - 100.0 pg/mL  I-Stat CG4 Lactic Acid, ED  Result Value Ref Range   Lactic Acid, Venous 3.26 (HH) 0.5 - 2.0 mmol/L   Comment NOTIFIED PHYSICIAN    Dg Chest 2 View  11/05/2014  CLINICAL DATA:  Leg swelling EXAM: CHEST  2 VIEW COMPARISON:  10/20/2014 FINDINGS: No cardiomegaly or failure. The lungs are clear. Negative aortic and hilar contours. IMPRESSION: Negative chest.  No cardiomegaly or failure. Electronically Signed   By: Marnee SpringJonathon  Watts M.D.   On: 11/05/2014 10:22AM    1. Cellulitis of right lower extremity   2. Thrombocytopenia (HCC)   3. Peripheral edema    A/P:  Pt with recurrent cellulitis after failing outpatient antibiotics. Right lower leg with concern for increasing cellulitis. Increasing pitting edema from the forefeet to the entirety of both legs and into the scrotum. No anasarca. Abdomen is soft and nontender. Patient is afebrile, non-tachycardic and without hypotension however he does have an elevated lactic acid. Blood cultures obtained and patient given vancomycin here in the emergency  department.  No evidence of CHF or fluid overload with nonischemic ECG, negative troponin, clear breath sounds and no evidence of urinary edema on chest x-ray.   Discussed with Dr. Butler Denmarkizwan of [facility] who will admit to MedSurg.  BP 122/69 mmHg  Pulse 68  Temp(Src) 99 F (37.2 C) (Oral)  Resp 16  Ht 5\' 10"  (1.778 m)  Wt 215 lb (97.523 kg)  BMI 30.85 kg/m2  SpO2 97%   Pt was seen by Shanna CiscoJamie Ward, PA-C and personally evaluated by myself with Courteney Jaiyon AnLyn Mackuen, MD supervising.      Dahlia ClientHannah Zainah Steven, PA-C 11/05/14 1126  Courteney Lyn Mackuen, MD 11/10/14 1505

## 2014-11-05 NOTE — Progress Notes (Signed)
ANTIBIOTIC CONSULT NOTE - INITIAL  Pharmacy Consult for vancomycin Indication: cellulitis  No Known Allergies  Patient Measurements: Height: 5\' 10"  (177.8 cm) Weight: 215 lb (97.523 kg) IBW/kg (Calculated) : 73   Vital Signs: Temp: 99 F (37.2 C) (10/20 0848) Temp Source: Oral (10/20 0848) BP: 112/69 mmHg (10/20 1130) Pulse Rate: 77 (10/20 1130) Intake/Output from previous day:   Intake/Output from this shift:    Labs:  Recent Labs  11/05/14 0900  WBC 4.0  HGB 11.6*  PLT 67*  CREATININE 0.82   Estimated Creatinine Clearance: 119.2 mL/min (by C-G formula based on Cr of 0.82). No results for input(s): VANCOTROUGH, VANCOPEAK, VANCORANDOM, GENTTROUGH, GENTPEAK, GENTRANDOM, TOBRATROUGH, TOBRAPEAK, TOBRARND, AMIKACINPEAK, AMIKACINTROU, AMIKACIN in the last 72 hours.   Microbiology: Recent Results (from the past 720 hour(s))  Culture, blood (routine x 2)     Status: None   Collection Time: 10/20/14  7:39 PM  Result Value Ref Range Status   Specimen Description BLOOD RIGHT ARM  Final   Special Requests BOTTLES DRAWN AEROBIC AND ANAEROBIC 10   Final   Culture NO GROWTH 5 DAYS  Final   Report Status 10/25/2014 FINAL  Final  Culture, blood (routine x 2)     Status: None   Collection Time: 10/20/14  7:47 PM  Result Value Ref Range Status   Specimen Description BLOOD RIGHT HAND  Final   Special Requests BOTTLES DRAWN AEROBIC ONLY 5CC  Final   Culture NO GROWTH 5 DAYS  Final   Report Status 10/25/2014 FINAL  Final    Assessment: 89 YOM with possible cellulitis with underlying chronic venous stasis dermatitis of BLE. Some thought to hold off on antibiotics and observe off, but confirmed with Dr. Butler Denmarkizwan and we will use vancomycin monotherapy at this time. SCr 0.82, CrCl >17200mL/min.  WBC nml, afebrile right now.  10/20 BCx: sent  Goal of Therapy:  Vancomycin trough level 10-15 mcg/ml  Plan:  -vancomycin 1000mg  IV q8h -follow c/s, clinical progression, renal function,  trough PRN, ability to de-escalate  Jhony Antrim D. Debarah Mccumbers, PharmD, BCPS Clinical Pharmacist Pager: [phone] 11/05/2014 1:12 PM

## 2014-11-05 NOTE — ED Notes (Signed)
Pt reports he was seen in the ED last week for cellulitis of the right leg.  Pt reports the swelling was improving until last night when he was woken up by pain and swelling in bilateral legs.  Pt has +3 pitting edema in bother lower extremities, worse on the left.  Pt denies SOB or chest pain at this time.

## 2014-11-05 NOTE — Progress Notes (Signed)
Admission note:  Arrival Method: Patient came in stretcher with staff accompanying from ER. Mental Orientation:  Alert and oriented x 4. Telemetry: N/A. Assessment: See doc flow sheets. Skin: Warm, dry and intact.  Assessed by two nurses (Cybill), no wounds or any skin tears noted except bruising and tattoos noted on both upper extremities.  4+ edema noted in both lower extremities, right more red than left and warm to touch.  Elevated both legs on two pillows as ordered. IV: Left wrist infusing antibiotic. Pain: Denies any pain. Tubes: N/A. Safety Measures:  Fall Prevention Safety Plan: Reviewed the plan, understood and acknowledged. Admission Screening: In process. 6700 Orientation: Patient has been oriented to the unit, staff and to the room.

## 2014-11-05 NOTE — ED Provider Notes (Signed)
CSN: [id number]     Arrival date & time 11/05/14  6578 History   First MD Initiated Contact with Patient 11/05/14 [phone]     Chief Complaint  Patient presents with  . Leg Swelling     (Consider location/radiation/quality/duration/timing/severity/associated sxs/prior Treatment) The history is provided by the patient and medical records. No language interpreter was used.  [name] presents with bilateral leg swelling and erythema. Pt reports pain (10/10) bilaterally from the waist down. He describes the pain as burning, needles pricks. He was seen for similar complaint on 10/20/14. Since his discharge, he states the swelling and redness were improving, but last night began to worsen again. He noted redness on his left foot for the first time last night, which is how the right-sided erythema began. He denies any alleviating factors and states compliance with medication. Patient admits to intermittent numbness in both lower extremities.  Past Medical History  Diagnosis Date  . Hepatitis   . Cirrhosis (HCC)   . SDH (subdural hematoma) (HCC)   . Seizure [facility])    Past Surgical History  Procedure Laterality Date  . Craniotomy Right 10/21/2013    Procedure: CRANIOTOMY HEMATOMA EVACUATION SUBDURAL;  Surgeon: Maeola Harman, MD;  Location: [facility];  Service: Neurosurgery;  Laterality: Right;   Family History  Problem Relation Age of Onset  . Cirrhosis Neg Hx   . Seizures Neg Hx    Social History  Substance Use Topics  . Smoking status: Never Smoker   . Smokeless tobacco: None  . Alcohol Use: Yes    Review of Systems  Constitutional: Negative.   Eyes:       + jaundice   Respiratory: Negative for cough, chest tightness, shortness of breath and wheezing.   Cardiovascular: Positive for leg swelling. Negative for chest pain and palpitations.  Gastrointestinal: Negative for nausea, vomiting, abdominal pain and abdominal distention.  Endocrine: Negative.   Musculoskeletal: Negative for  back pain and arthralgias.  Skin: Positive for color change.  Neurological: Positive for numbness. Negative for dizziness, weakness and headaches.  Psychiatric/Behavioral: Negative.       Allergies  Review of patient's allergies indicates no known allergies.  Home Medications   Prior to Admission medications   Medication Sig Start Date End Date Taking? Authorizing Provider  acetaminophen (TYLENOL) 500 MG tablet Take 1,000 mg by mouth every 6 (six) hours as needed for moderate pain.   Yes Historical Provider, MD  cefUROXime (CEFTIN) 500 MG tablet Take 1 tablet (500 mg total) by mouth 2 (two) times daily with a meal. 10/24/14  Yes Maryann Mikhail, DO  dexlansoprazole (DEXILANT) 60 MG capsule Take 60 mg by mouth daily.   Yes Historical Provider, MD  lactulose (CHRONULAC) 10 GM/15ML solution Take 10 g by mouth 2 (two) times daily.    Yes Historical Provider, MD  propranolol (INDERAL) 20 MG tablet Take 20 mg by mouth 2 (two) times daily.   Yes Historical Provider, MD  rifaximin (XIFAXAN) 550 MG TABS tablet Take 550 mg by mouth 2 (two) times daily.   Yes Historical Provider, MD   BP 116/73 mmHg  Pulse 86  Temp(Src) 99 F (37.2 C) (Oral)  Resp 20  Ht  (1.778 m)  Wt 215 lb (97.523 kg)  BMI 30.85 kg/m2  SpO2 95% Physical Exam  Constitutional: He is oriented to person, place, and time. He appears well-developed and well-nourished.  HENT:  Head: Normocephalic and atraumatic.  Eyes:  + scleral jaundice   Neck: Normal  range of motion. Neck supple.  Cardiovascular: Normal rate, regular rhythm, normal heart sounds and intact distal pulses.   Pulmonary/Chest: Effort normal and breath sounds normal.  Abdominal: Soft. Bowel sounds are normal. He exhibits no distension. There is no tenderness. There is no rebound and no guarding.  Musculoskeletal: He exhibits edema.  Tenderness to palpation of bilateral LE's from groin to ankle.   Neurological: He is alert and oriented to person, place,  and time.  Skin: Skin is warm and dry. There is erythema.     Erythema bilateral lower extremities as depicted in image. Right LE well demarcated borders approx. 2 inches distally to skin marking from prior visit. Left LE patch-like redness of foot. Patient states this is how right LE began.   Erythema without discharge or purulence   Psychiatric: He has a normal mood and affect. His behavior is normal. Judgment and thought content normal.  Vitals reviewed.   ED Course  Procedures (including critical care time) Labs Review Labs Reviewed  CBC WITH DIFFERENTIAL/PLATELET - Abnormal; Notable for the following:    RBC 3.25 (*)    Hemoglobin 11.6 (*)    HCT 33.9 (*)    MCV 104.3 (*)    MCH 35.7 (*)    Platelets 67 (*)    All other components within normal limits  COMPREHENSIVE METABOLIC PANEL - Abnormal; Notable for the following:    BUN <5 (*)    Calcium 8.1 (*)    Total Protein 4.9 (*)    Albumin 1.8 (*)    AST 58 (*)    Total Bilirubin 6.4 (*)    All other components within normal limits  I-STAT CG4 LACTIC ACID, ED - Abnormal; Notable for the following:    Lactic Acid, Venous 3.26 (*)    All other components within normal limits  CULTURE, BLOOD (ROUTINE X 2)  CULTURE, BLOOD (ROUTINE X 2)  BRAIN NATRIURETIC PEPTIDE  TROPONIN I  ETHANOL  URINE RAPID DRUG SCREEN, HOSP PERFORMED    Imaging Review Dg Chest 2 View  11/05/2014  CLINICAL DATA:  Leg swelling EXAM: CHEST  2 VIEW COMPARISON:  10/20/2014 FINDINGS: No cardiomegaly or failure. The lungs are clear. Negative aortic and hilar contours. IMPRESSION: Negative chest.  No cardiomegaly or failure. Electronically Signed   By: Marnee SpringJonathon  Watts M.D.   On: 11/05/2014 10:22   I have personally reviewed and evaluated these images and lab results as part of my medical decision-making.   EKG Interpretation   Date/Time:  Thursday November 05 2014 09:44:35 EDT Ventricular Rate:  67 PR Interval:  61 QRS Duration: 98 QT Interval:   457 QTC Calculation: 482 R Axis:   47 Text Interpretation:  Sinus rhythm Short PR interval Consider right atrial  enlargement Low voltage, precordial leads Borderline prolonged QT interval  Baseline wander in lead(s) V3 V4 V5 V6 no acute ischemia. No significant  change since last tracing Confirmed by Kandis MannanMACKUEN, COURTNEY ([id number]) on  11/05/2014 10:17:02 AM      MDM   Final diagnoses:  None   Norris Crossandall S [name] presented with bilateral lower extremity swelling and erythema, likely cellulitis which has now failed outpatient management. Differential includes heart failure.  11:12 am: Pt. Does not meet SIRS criteria, but with lactate of 3.26, blood cultures x2 obtained and IV vancomycin started.  No evidence of CHF, BNP wnl.  Disposition: Admission needed for IV antibiotics, likely diuresis, and further observation.  Patient discussed with Dr. Butler Denmarkizwan of [facility] and will be admitted  to a medsurg bed.     Chase Picket Ward, PA-C 11/05/14 1249  Courteney Donal An, MD 11/05/14 1626

## 2014-11-06 ENCOUNTER — Inpatient Hospital Stay: Payer: Medicaid Other | Admitting: Family Medicine

## 2014-11-06 NOTE — Progress Notes (Signed)
[facility] Progress Note   Gary [name]  WUJ:[id number]  DOB: 21-Jun-1960  DOA: 11/05/2014 PCP: Rodman Key, FNP  Brief narrative: Gary [name] is a 54 y.o. male with b/l pedal edema, erythema and pain. Recently treated for cellulitis and had not yet finished his course of Ceftin.    Subjective: Swelling and pain in legs improving. No new complaints.   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       Family Communication:  Disposition Plan: home in 1-2 days once swelling improves DVT prophylaxis: Lovenox Consultants: Procedures:  Antibiotics: Anti-infectives    Start     Dose/Rate Route Frequency Ordered Stop   11/05/14 2200  rifaximin (XIFAXAN) tablet 550 mg     550 mg Oral 2 times daily 11/05/14 1141     11/05/14 1400  vancomycin (VANCOCIN) IVPB 1000 mg/200 mL premix     1,000 mg 200 mL/hr over 60 Minutes Intravenous Every 8 hours 11/05/14 1315     11/05/14 1100  vancomycin (VANCOCIN) IVPB 1000 mg/200 mL premix  Status:  Discontinued     1,000 mg 200 mL/hr over 60 Minutes Intravenous  Once 11/05/14 1051 11/05/14 1213      Objective: Filed  Weights   11/05/14 0848  Weight: 97.523 kg (215 lb)    Intake/Output Summary (Last 24 hours) at 11/06/14 0808 Last data filed at 11/06/14 9562  Gross per 24 hour  Intake   1120 ml  Output    700 ml  Net    420 ml     Vitals Filed Vitals:   11/05/14 2109 11/05/14 2206 11/06/14 0608 11/06/14 0751  BP: 113/71 112/67 92/52 99/55   Pulse: 83 71 58 58  Temp: 98.1 F (36.7 C) 98.5 F (36.9 C) 98.4 F (36.9 C) 97.9 F (36.6 C)  TempSrc: Oral Oral Oral Oral  Resp: Height:      Weight:      SpO2: 94% 98% 98% 96%    Exam:  General:  Pt is alert, not in acute distress  HEENT: No icterus, No thrush, oral mucosa moist  Cardiovascular: regular rate and rhythm, S1/S2 No murmur  Respiratory: clear to auscultation bilaterally   Abdomen: Soft, +Bowel sounds, non tender, no guarding- distension improving   MSK: +3 pitting LE edema L > R  B/l  erythema R> L, no cyanosis or clubbing  Data Reviewed: Basic Metabolic Panel:  Recent Labs Lab 11/05/14 0900  NA 135  K 4.2  CL 105  CO2 23  GLUCOSE 89  BUN <5*  CREATININE 0.82  CALCIUM 8.1*   Liver Function Tests:  Recent Labs Lab 11/05/14 0900  AST 58*  ALT 18  ALKPHOS 70  BILITOT 6.4*  PROT 4.9*  ALBUMIN 1.8*   No results for input(s): LIPASE, AMYLASE in the last 168 hours. No results for input(s): AMMONIA in the last 168 hours. CBC:  Recent Labs Lab 11/05/14 0900  WBC 4.0  NEUTROABS 2.0  HGB 11.6*  HCT 33.9*  MCV 104.3*  PLT 67*   Cardiac Enzymes: No results for input(s): CKTOTAL, CKMB, CKMBINDEX, TROPONINI in the last 168 hours. BNP (last 3 results)  Recent Labs  10/20/14 1518 11/05/14 0900  BNP 191.0* 65.5    ProBNP (last 3 results) No results for input(s): PROBNP in the last 8760 hours.  CBG: No results for input(s): GLUCAP in the last 168 hours.  No results found for this or any previous visit (from the past 240 hour(s)).   Studies: Dg Chest 2 View  11/05/2014  CLINICAL  DATA:  Leg swelling EXAM: CHEST  2 VIEW COMPARISON:  10/20/2014 FINDINGS: No cardiomegaly or failure. The lungs are clear. Negative aortic and hilar contours. IMPRESSION: Negative chest.  No cardiomegaly or failure. Electronically Signed   By: Marnee SpringJonathon  Watts M.D.   On: 11/05/2014 10:22    Scheduled Meds:  Scheduled Meds: . enoxaparin (LOVENOX) injection  40 mg Subcutaneous Q24H  . furosemide  40 mg Intravenous BID  . lactulose  10 g Oral BID  . pantoprazole  40 mg Oral Daily  . propranolol  20 mg Oral BID  . rifaximin  550 mg Oral BID  . sodium chloride  3 mL Intravenous Q12H  . vancomycin  1,000 mg Intravenous Q8H   Continuous Infusions:   Time spent on care of this patient: 35 min   Wanna Gully, MD 11/06/2014, 8:08 AM  LOS: 1 day   [facility] Office  [phone] Pager - Text Page per [email or web address] If 7PM-7AM, please contact night-coverage [email or web address]

## 2014-11-07 DIAGNOSIS — I8312 Varicose veins of left lower extremity with inflammation: Secondary | ICD-10-CM

## 2014-11-07 DIAGNOSIS — I8311 Varicose veins of right lower extremity with inflammation: Secondary | ICD-10-CM

## 2014-11-07 LAB — BASIC METABOLIC PANEL
ANION GAP: 5 (ref 5–15)
BUN: 9 mg/dL (ref 6–20)
CO2: 27 mmol/L (ref 22–32)
Calcium: 7.5 mg/dL — ABNORMAL LOW (ref 8.9–10.3)
Chloride: 100 mmol/L — ABNORMAL LOW (ref 101–111)
Creatinine, Ser: 1.26 mg/dL — ABNORMAL HIGH (ref 0.61–1.24)
GFR calc Af Amer: >60 mL/min (ref >60)
Glucose, Bld: 107 mg/dL — ABNORMAL HIGH (ref 65–99)
POTASSIUM: 3.1 mmol/L — AB (ref 3.5–5.1)
SODIUM: 132 mmol/L — AB (ref 135–145)

## 2014-11-07 LAB — MAGNESIUM: MAGNESIUM: 1.5 mg/dL — AB (ref 1.7–2.4)

## 2014-11-07 MED ORDER — SPIRONOLACTONE 25 MG PO TABS
25.0000 mg | ORAL_TABLET | Freq: Every day | ORAL | Status: DC
  Administered 2014-11-07: 25 mg via ORAL
  Filled 2014-11-07: qty 1

## 2014-11-07 MED ORDER — MAGNESIUM SULFATE 2 GM/50ML IV SOLN
2.0000 g | Freq: Once | INTRAVENOUS | Status: AC
  Administered 2014-11-07: 2 g via INTRAVENOUS
  Filled 2014-11-07: qty 50

## 2014-11-07 MED ORDER — POTASSIUM CHLORIDE CRYS ER 20 MEQ PO TBCR
40.0000 meq | EXTENDED_RELEASE_TABLET | Freq: Once | ORAL | Status: AC
  Administered 2014-11-07: 40 meq via ORAL
  Filled 2014-11-07: qty 2

## 2014-11-07 MED ORDER — FUROSEMIDE 40 MG PO TABS: 40.0000 mg | ORAL_TABLET | Freq: Two times a day (BID) | ORAL | Status: DC

## 2014-11-07 NOTE — Progress Notes (Signed)
[facility] Progress Note   Gary [name]  ZOX:[id number]  DOB: 10/21/1960  DOA: 11/05/2014 PCP: Rodman KeyHarrison, Dawn S, FNP  Brief narrative: Gary CrossRandall S Bukhari is a 54 y.o. male with b/l pedal edema, erythema and pain. Recently treated for cellulitis and had not yet finished his course of Ceftin.    Subjective:  In bed denies any headache, no chest abdominal pain no shortness of breath. Feels better. Swelling is going down in his legs and scrotum.  Assessment/Plan:   Chronic venous stasis dermatitis of both lower extremities, scrotal edema likely due to low albumin levels from cirrhosis and portal hypertension. Do not think he has an active infection agree with stopping of vancomycin, continue Lasix at a lower dose as creatinine has bumped slightly, apply TED stockings, fluid intake restriction. Monitor electrolytes closely.     Cirrhosis  Secondary to Hep C with varices, hepatic encephalopathy and ascites  - cont Propranolol , lactulose and Xifaxin, outpatient follow-up with GI post discharge - No encephalopathy this admission     Thrombocytopenia - due to cirrhosis     Lactic acidosis - resolved    Code Status:     Code Status Orders        Start     Ordered   11/05/14 1221  Full code   Continuous     11/05/14 1221    Advance Directive Documentation        Most Recent Value   Type of Advance Directive  Healthcare Power of Attorney, Living will   Pre-existing out of facility DNR order (yellow form or pink MOST form)     "MOST" Form in Place?       Family Communication:  Disposition Plan: home in 1-2 days once swelling improves DVT prophylaxis: Lovenox Consultants: Procedures:  Antibiotics: Anti-infectives    Start     Dose/Rate Route Frequency Ordered Stop   11/05/14 2200  rifaximin (XIFAXAN) tablet 550 mg     550 mg Oral 2 times daily 11/05/14 1141     11/05/14 1400  vancomycin (VANCOCIN) IVPB 1000 mg/200 mL premix  Status:  Discontinued     1,000 mg 200 mL/hr over 60 Minutes Intravenous Every 8 hours 11/05/14 1315 11/06/14 0814   11/05/14 1100  vancomycin (VANCOCIN) IVPB 1000 mg/200 mL premix  Status:  Discontinued     1,000 mg 200 mL/hr over 60 Minutes Intravenous  Once 11/05/14 1051 11/05/14 1213      Objective: Filed Weights   11/05/14 0848 11/06/14 0800 11/06/14 2001  Weight: 97.523 kg (215 lb) 96.1 kg (211 lb 13.8 oz) 100.699 kg (222 lb)    Intake/Output Summary (Last 24 hours) at 11/07/14 1151 Last data filed at 11/07/14 1029  Gross per 24 hour  Intake    600 ml  Output   1200 ml  Net   -600 ml     Vitals Filed Vitals:   11/07/14 0548 11/07/14 0741 11/07/14 0900 11/07/14 0938  BP: 97/63 97/53 101/63 105/57  Pulse: 57 55 61 104  Temp: 98.1 F (36.7 C) 98.6 F (37 C) 98.3 F (36.8 C)   TempSrc:  Oral Oral   Resp: 20 18 17    Height:      Weight:      SpO2: 99% 97% 98%     Exam:  General:  Pt is alert, not in acute distress  HEENT: No icterus, No thrush, oral mucosa moist  Cardiovascular: regular rate and rhythm, S1/S2 No murmur  Respiratory: clear to auscultation  bilaterally   Abdomen: Soft, +Bowel sounds, non tender, no guarding- distension improving   MSK: +3 pitting LE edema L > R  B/l  erythema R> L, no cyanosis or clubbing  Data Reviewed: Basic Metabolic Panel:  Recent Labs Lab 11/05/14 0900 11/07/14 0755  NA 135 132*  K 4.2 3.1*  CL 105 100*  CO2 23 27  GLUCOSE 89 107*  BUN <5* 9  CREATININE 0.82 1.26*  CALCIUM 8.1* 7.5*  MG  --  1.5*   Liver Function Tests:  Recent Labs Lab 11/05/14 0900  AST 58*  ALT 18  ALKPHOS 70  BILITOT 6.4*  PROT 4.9*  ALBUMIN 1.8*   No results for input(s): LIPASE, AMYLASE in the last 168 hours. No results for input(s): AMMONIA in the last 168 hours. CBC:  Recent Labs Lab 11/05/14 0900  WBC 4.0  NEUTROABS 2.0  HGB 11.6*  HCT 33.9*  MCV 104.3*  PLT 67*   Cardiac Enzymes: No results for input(s): CKTOTAL, CKMB, CKMBINDEX,  TROPONINI in the last 168 hours. BNP (last 3 results)  Recent Labs  10/20/14 1518 11/05/14 0900  BNP 191.0* 65.5    ProBNP (last 3 results) No results for input(s): PROBNP in the last 8760 hours.  CBG: No results for input(s): GLUCAP in the last 168 hours.  Recent Results (from the past 240 hour(s))  Blood culture (routine x 2)     Status: None (Preliminary result)   Collection Time: 11/05/14 11:41 AM  Result Value Ref Range Status   Specimen Description BLOOD RIGHT ARM  Final   Special Requests BOTTLES DRAWN AEROBIC AND ANAEROBIC 5CC  Final   Culture NO GROWTH 2 DAYS  Final   Report Status PENDING  Incomplete  Blood culture (routine x 2)     Status: None (Preliminary result)   Collection Time: 11/05/14 11:56 AM  Result Value Ref Range Status   Specimen Description BLOOD LEFT ARM  Final   Special Requests BOTTLES DRAWN AEROBIC AND ANAEROBIC 4CC  Final   Culture NO GROWTH 2 DAYS  Final   Report Status PENDING  Incomplete     Studies: No results found.  Scheduled Meds:  Scheduled Meds: . enoxaparin (LOVENOX) injection  40 mg Subcutaneous Q24H  . furosemide  40 mg Intravenous BID  . lactulose  10 g Oral BID  . magnesium sulfate 1 - 4 g bolus IVPB  2 g Intravenous Once  . pantoprazole  40 mg Oral Daily  . potassium chloride  40 mEq Oral Once  . propranolol  20 mg Oral BID  . rifaximin  550 mg Oral BID  . spironolactone  25 mg Oral Daily   Continuous Infusions:   Time spent on care of this patient: 35 min   Leroy Sea, MD 11/07/2014, 11:51 AM  LOS: 2 days   [facility] Office  [phone] Pager - Text Page per [email or web address] If 7PM-7AM, please contact night-coverage [email or web address]

## 2014-11-07 NOTE — Progress Notes (Signed)
Clinical Social Work department aware of patient and consult for Substance Abuse counseling. Patient just see on 10/19 for SA resources and he politely declined. At this time, he reports no interest.  CM following as well for aftercare planning.  If patient desires more information, education, or resources, LCSW will be happy to follow back up prior to DC.  Deretha EmoryHannah Samba Cumba LCSW, MSW Clinical Social Work: Emergency Room [phone]

## 2014-11-08 LAB — BASIC METABOLIC PANEL
Anion gap: 7 (ref 5–15)
BUN: 13 mg/dL (ref 6–20)
CALCIUM: 7.5 mg/dL — AB (ref 8.9–10.3)
CO2: 23 mmol/L (ref 22–32)
CREATININE: 1.58 mg/dL — AB (ref 0.61–1.24)
Chloride: 106 mmol/L (ref 101–111)
GFR calc non Af Amer: 48 mL/min — ABNORMAL LOW (ref >60)
GFR, EST AFRICAN AMERICAN: 55 mL/min — AB (ref >60)
Glucose, Bld: 110 mg/dL — ABNORMAL HIGH (ref 65–99)
Potassium: 3.6 mmol/L (ref 3.5–5.1)
SODIUM: 136 mmol/L (ref 135–145)

## 2014-11-08 LAB — MAGNESIUM: Magnesium: 1.8 mg/dL (ref 1.7–2.4)

## 2014-11-08 MED ORDER — SODIUM CHLORIDE 0.9 % IV SOLN
INTRAVENOUS | Status: AC
  Administered 2014-11-08 (×2): via INTRAVENOUS

## 2014-11-08 MED ORDER — ALBUMIN HUMAN 25 % IV SOLN
50.0000 g | Freq: Four times a day (QID) | INTRAVENOUS | Status: AC
  Administered 2014-11-08 – 2014-11-09 (×4): 50 g via INTRAVENOUS
  Filled 2014-11-08 (×5): qty 200

## 2014-11-08 MED ORDER — PROPRANOLOL HCL 10 MG PO TABS
10.0000 mg | ORAL_TABLET | Freq: Two times a day (BID) | ORAL | Status: DC
  Administered 2014-11-08: 10 mg via ORAL
  Filled 2014-11-08 (×4): qty 1

## 2014-11-08 MED ORDER — MIDODRINE HCL 5 MG PO TABS
10.0000 mg | ORAL_TABLET | Freq: Three times a day (TID) | ORAL | Status: DC
  Administered 2014-11-08 – 2014-11-10 (×8): 10 mg via ORAL
  Filled 2014-11-08 (×8): qty 2

## 2014-11-08 NOTE — Progress Notes (Signed)
[facility] Progress Note   Gary [name]  [id number]  DOB: 08/25/1960  DOA: 11/05/2014 PCP: Rodman KeyHarrison, Dawn S, FNP  Brief narrative: Gary [name] is a 54 y.o. male with b/l pedal edema, erythema and pain. Recently treated for cellulitis and had not yet finished his course of Ceftin.    Subjective:  In bed denies any headache, no chest abdominal pain no shortness of breath. Feels better. Swelling is going down in his legs and scrotum.  Assessment/Plan:   Chronic venous stasis dermatitis of both lower extremities, scrotal edema likely due to low albumin levels from cirrhosis and portal hypertension. Swelling improved with diuresis however is still helping ARF hence diuresis has to be held for now. No signs of infection. Applied TED stockings.   Cirrhosis  Secondary to Hep C with varices, hepatic encephalopathy and ascites - cont Propranolol at a lower dose since blood pressure is low , hold lactulose since blood pressure low but continue Xifaxin, outpatient follow-up with GI post discharge,  No encephalopathy this admission   Thrombocytopenia - due to cirrhosis   Lactic acidosis resolved   ARF. Low albumin and completely third spaced, however diuresis has to be held due to acute renal failure, will place on midodrine-albumin along with gentle fluids. Repeat BMP in the morning.     Code Status:     Code Status Orders        Start     Ordered   11/05/14 1221  Full code   Continuous     11/05/14 1221    Advance Directive Documentation        Most Recent Value   Type of Advance Directive  Healthcare Power of Attorney, Living will   Pre-existing out of facility DNR order (yellow form or pink MOST form)     "MOST" Form in Place?       Family Communication: None present Disposition Plan: home in 1-2 days once swelling improves DVT prophylaxis: Lovenox Consultants: Procedures:  Antibiotics: Anti-infectives    Start     Dose/Rate Route Frequency  Ordered Stop   11/05/14 2200  rifaximin (XIFAXAN) tablet 550 mg     550 mg Oral 2 times daily 11/05/14 1141     11/05/14 1400  vancomycin (VANCOCIN) IVPB 1000 mg/200 mL premix  Status:  Discontinued     1,000 mg 200 mL/hr over 60 Minutes Intravenous Every 8 hours 11/05/14 1315 11/06/14 0814   11/05/14 1100  vancomycin (VANCOCIN) IVPB 1000 mg/200 mL premix  Status:  Discontinued     1,000 mg 200 mL/hr over 60 Minutes Intravenous  Once 11/05/14 1051 11/05/14 1213      Objective: Filed Weights   11/06/14 0800 11/06/14 2001 11/07/14 2035  Weight: 96.1 kg (211 lb 13.8 oz) 100.699 kg (222 lb) 98.431 kg (217 lb)    Intake/Output Summary (Last 24 hours) at 11/08/14 1028 Last data filed at 11/08/14 0600  Gross per 24 hour  Intake    770 ml  Output    500 ml  Net    270 ml     Vitals Filed Vitals:   11/07/14 1532 11/07/14 2035 11/08/14 0527 11/08/14 0759  BP: 97/55 99/51 101/47 98/47  Pulse: 65 57 56 54  Temp: 98.6 F (37 C) 99 F (37.2 C) 98.7 F (37.1 C) 98.6 F (37 C)  TempSrc: Oral   Oral  Resp: 16 17 17 16   Height:      Weight:  98.431 kg (217 lb)  SpO2: 99% 99% 98% 100%    Exam:  General:  Pt is alert, not in acute distress  HEENT: No icterus, No thrush, oral mucosa moist  Cardiovascular: regular rate and rhythm, S1/S2 No murmur  Respiratory: clear to auscultation bilaterally   Abdomen: Soft, +Bowel sounds, non tender, no guarding- distension improving   MSK: +3 pitting LE edema L > R  B/l  erythema R> L, no cyanosis or clubbing  Data Reviewed: Basic Metabolic Panel:  Recent Labs Lab 11/05/14 0900 11/07/14 0755 11/08/14 0247  NA 135 132* 136  K 4.2 3.1* 3.6  CL 105 100* 106  CO2 GLUCOSE 89 107* 110*  BUN <5* 9 13  CREATININE 0.82 1.26* 1.58*  CALCIUM 8.1* 7.5* 7.5*  MG  --  1.5* 1.8   Liver Function Tests:  Recent Labs Lab 11/05/14 0900  AST 58*  ALT 18  ALKPHOS 70  BILITOT 6.4*  PROT 4.9*  ALBUMIN 1.8*   No results for  input(s): LIPASE, AMYLASE in the last 168 hours. No results for input(s): AMMONIA in the last 168 hours. CBC:  Recent Labs Lab 11/05/14 0900  WBC 4.0  NEUTROABS 2.0  HGB 11.6*  HCT 33.9*  MCV 104.3*  PLT 67*   Cardiac Enzymes: No results for input(s): CKTOTAL, CKMB, CKMBINDEX, TROPONINI in the last 168 hours. BNP (last 3 results)  Recent Labs  10/20/14 1518 11/05/14 0900  BNP 191.0* 65.5    ProBNP (last 3 results) No results for input(s): PROBNP in the last 8760 hours.  CBG: No results for input(s): GLUCAP in the last 168 hours.  Recent Results (from the past 240 hour(s))  Blood culture (routine x 2)     Status: None (Preliminary result)   Collection Time: 11/05/14 11:41 AM  Result Value Ref Range Status   Specimen Description BLOOD RIGHT ARM  Final   Special Requests BOTTLES DRAWN AEROBIC AND ANAEROBIC 5CC  Final   Culture NO GROWTH 3 DAYS  Final   Report Status PENDING  Incomplete  Blood culture (routine x 2)     Status: None (Preliminary result)   Collection Time: 11/05/14 11:56 AM  Result Value Ref Range Status   Specimen Description BLOOD LEFT ARM  Final   Special Requests BOTTLES DRAWN AEROBIC AND ANAEROBIC 4CC  Final   Culture NO GROWTH 3 DAYS  Final   Report Status PENDING  Incomplete     Studies: No results found.  Scheduled Meds:  Scheduled Meds: . albumin human  50 g Intravenous Q6H  . enoxaparin (LOVENOX) injection  40 mg Subcutaneous Q24H  . midodrine  10 mg Oral TID WC  . pantoprazole  40 mg Oral Daily  . propranolol  10 mg Oral BID  . rifaximin  550 mg Oral BID   Continuous Infusions: . sodium chloride      Time spent on care of this patient: 35 min   Leroy Sea, MD 11/08/2014, 10:28 AM  LOS: 3 days   [facility] Office  [phone] Pager - Text Page per [email or web address] If 7PM-7AM, please contact night-coverage [email or web address]

## 2014-11-09 LAB — BASIC METABOLIC PANEL
Anion gap: 5 (ref 5–15)
BUN: 16 mg/dL (ref 6–20)
CHLORIDE: 106 mmol/L (ref 101–111)
CO2: 28 mmol/L (ref 22–32)
Calcium: 8 mg/dL — ABNORMAL LOW (ref 8.9–10.3)
Creatinine, Ser: 1.4 mg/dL — ABNORMAL HIGH (ref 0.61–1.24)
GFR calc Af Amer: >60 mL/min (ref >60)
GFR calc non Af Amer: 55 mL/min — ABNORMAL LOW (ref >60)
GLUCOSE: 104 mg/dL — AB (ref 65–99)
Potassium: 3.5 mmol/L (ref 3.5–5.1)
Sodium: 139 mmol/L (ref 135–145)

## 2014-11-09 MED ORDER — ALBUMIN HUMAN 25 % IV SOLN
50.0000 g | Freq: Four times a day (QID) | INTRAVENOUS | Status: AC
  Administered 2014-11-09 – 2014-11-10 (×4): 50 g via INTRAVENOUS
  Filled 2014-11-09 (×5): qty 200

## 2014-11-09 NOTE — Progress Notes (Signed)
[facility] Progress Note   Gary [name]  [id number]  DOB: 1960/01/18  DOA: 11/05/2014 PCP: Rodman Key, FNP  Brief narrative:  Gary [name] is a 54 y.o. male in bed, denies any chest pain belly pain. No shortness of breath or focal weakness. Feels better today.   Subjective:  In bed denies any headache, no chest abdominal pain no shortness of breath. Feels better. Swelling is going down in his legs and scrotum.  Assessment/Plan:   Chronic venous stasis dermatitis of both lower extremities, scrotal edema likely due to low albumin levels from cirrhosis and portal hypertension. Swelling improved with diuresis however is still helping ARF hence diuresis has to be held for now. No signs of infection. Applied TED stockings.   Cirrhosis  Secondary to Hep C with varices, hepatic encephalopathy and ascites - cont Propranolol at a lower dose since blood pressure is low , hold lactulose since blood pressure low but continue Xifaxin, outpatient follow-up with GI post discharge,  No encephalopathy this admission   Thrombocytopenia - due to cirrhosis   Lactic acidosis resolved   ARF. Low albumin and completely third spaced, however diuresis has to be held due to acute renal failure, will place on midodrine-albumin along with gentle fluids. Repeat BMP in the morning.     Code Status:     Code Status Orders        Start     Ordered   11/05/14 1221  Full code   Continuous     11/05/14 1221    Advance Directive Documentation        Most Recent Value   Type of Advance Directive  Healthcare Power of Attorney, Living will   Pre-existing out of facility DNR order (yellow form or pink MOST form)     "MOST" Form in Place?       Family Communication: None present Disposition Plan: home in 1-2 days once swelling improves DVT prophylaxis: Lovenox Consultants: Procedures:  Antibiotics: Anti-infectives    Start     Dose/Rate Route Frequency Ordered Stop    11/05/14 2200  rifaximin (XIFAXAN) tablet 550 mg     550 mg Oral 2 times daily 11/05/14 1141     11/05/14 1400  vancomycin (VANCOCIN) IVPB 1000 mg/200 mL premix  Status:  Discontinued     1,000 mg 200 mL/hr over 60 Minutes Intravenous Every 8 hours 11/05/14 1315 11/06/14 0814   11/05/14 1100  vancomycin (VANCOCIN) IVPB 1000 mg/200 mL premix  Status:  Discontinued     1,000 mg 200 mL/hr over 60 Minutes Intravenous  Once 11/05/14 1051 11/05/14 1213      Objective: Filed Weights   11/06/14 2001 11/07/14 2035 11/09/14 0113  Weight: 100.699 kg (222 lb) 98.431 kg (217 lb) 100.517 kg (221 lb 9.6 oz)    Intake/Output Summary (Last 24 hours) at 11/09/14 1127 Last data filed at 11/08/14 2200  Gross per 24 hour  Intake 1878.33 ml  Output   1150 ml  Net 728.33 ml     Vitals Filed Vitals:   11/09/14 0115 11/09/14 0250 11/09/14 0505 11/09/14 0911  BP: 96/63  Pulse: 53 60 50 51  Temp: 98.5 F (36.9 C) 98 F (36.7 C) 98.5 F (36.9 C) 98.3 F (36.8 C)  TempSrc: Oral Oral Oral Oral  Resp: Height:      Weight:      SpO2: 97% 94% 96% 96%    Exam:  General:  Pt is alert, not in acute distress  HEENT: No icterus, No thrush, oral mucosa moist  Cardiovascular: regular rate and rhythm, S1/S2 No murmur  Respiratory: clear to auscultation bilaterally   Abdomen: Soft, +Bowel sounds, non tender, no guarding- distension improving   MSK: +3 pitting LE edema L > R  B/l  erythema R> L, no cyanosis or clubbing  Data Reviewed: Basic Metabolic Panel:  Recent Labs Lab 11/05/14 0900 11/07/14 0755 11/08/14 0247 11/09/14 0431  NA 135 132* 136 139  K 4.2 3.1* 3.6 3.5  CL 105 100* 106 106  CO2 23 27 23 28   GLUCOSE 89 107* 110* 104*  BUN <5* 9 13 16   CREATININE 0.82 1.26* 1.58* 1.40*  CALCIUM 8.1* 7.5* 7.5* 8.0*  MG  --  1.5* 1.8  --    Liver Function Tests:  Recent Labs Lab 11/05/14 0900  AST 58*  ALT 18  ALKPHOS 70  BILITOT 6.4*  PROT 4.9*   ALBUMIN 1.8*   No results for input(s): LIPASE, AMYLASE in the last 168 hours. No results for input(s): AMMONIA in the last 168 hours. CBC:  Recent Labs Lab 11/05/14 0900  WBC 4.0  NEUTROABS 2.0  HGB 11.6*  HCT 33.9*  MCV 104.3*  PLT 67*   Cardiac Enzymes: No results for input(s): CKTOTAL, CKMB, CKMBINDEX, TROPONINI in the last 168 hours. BNP (last 3 results)  Recent Labs  10/20/14 1518 11/05/14 0900  BNP 191.0* 65.5    ProBNP (last 3 results) No results for input(s): PROBNP in the last 8760 hours.  CBG: No results for input(s): GLUCAP in the last 168 hours.  Recent Results (from the past 240 hour(s))  Blood culture (routine x 2)     Status: None (Preliminary result)   Collection Time: 11/05/14 11:41 AM  Result Value Ref Range Status   Specimen Description BLOOD RIGHT ARM  Final   Special Requests BOTTLES DRAWN AEROBIC AND ANAEROBIC 5CC  Final   Culture NO GROWTH 3 DAYS  Final   Report Status PENDING  Incomplete  Blood culture (routine x 2)     Status: None (Preliminary result)   Collection Time: 11/05/14 11:56 AM  Result Value Ref Range Status   Specimen Description BLOOD LEFT ARM  Final   Special Requests BOTTLES DRAWN AEROBIC AND ANAEROBIC 4CC  Final   Culture NO GROWTH 3 DAYS  Final   Report Status PENDING  Incomplete     Studies: No results found.  Scheduled Meds:  Scheduled Meds: . albumin human  50 g Intravenous Q6H  . enoxaparin (LOVENOX) injection  40 mg Subcutaneous Q24H  . midodrine  10 mg Oral TID WC  . pantoprazole  40 mg Oral Daily  . rifaximin  550 mg Oral BID   Continuous Infusions:    Time spent on care of this patient: 35 min   Leroy SeaSINGH,Taegan Haider K, MD 11/09/2014, 11:27 AM  LOS: 4 days   [facility] Office  [phone] Pager - Text Page per [email or web address] If 7PM-7AM, please contact night-coverage [email or web address]

## 2014-11-10 LAB — BASIC METABOLIC PANEL
Anion gap: 7 (ref 5–15)
BUN: 17 mg/dL (ref 6–20)
CALCIUM: 8.6 mg/dL — AB (ref 8.9–10.3)
CHLORIDE: 109 mmol/L (ref 101–111)
CO2: 25 mmol/L (ref 22–32)
Creatinine, Ser: 1.27 mg/dL — ABNORMAL HIGH (ref 0.61–1.24)
GFR calc Af Amer: >60 mL/min (ref >60)
GLUCOSE: 92 mg/dL (ref 65–99)
Potassium: 3.7 mmol/L (ref 3.5–5.1)
Sodium: 141 mmol/L (ref 135–145)

## 2014-11-10 LAB — CULTURE, BLOOD (ROUTINE X 2)
CULTURE: NO GROWTH
Culture: NO GROWTH

## 2014-11-10 LAB — CBC
HCT: 26.6 % — ABNORMAL LOW (ref 39.0–52.0)
HEMOGLOBIN: 9 g/dL — AB (ref 13.0–17.0)
MCH: 36.1 pg — ABNORMAL HIGH (ref 26.0–34.0)
MCHC: 33.8 g/dL (ref 30.0–36.0)
MCV: 106.8 fL — ABNORMAL HIGH (ref 78.0–100.0)
Platelets: 38 10*3/uL — ABNORMAL LOW (ref 150–400)
RBC: 2.49 MIL/uL — ABNORMAL LOW (ref 4.22–5.81)
RDW: 15 % (ref 11.5–15.5)
WBC: 2.5 10*3/uL — ABNORMAL LOW (ref 4.0–10.5)

## 2014-11-10 MED ORDER — RIFAXIMIN 550 MG PO TABS: 550.0000 mg | ORAL_TABLET | Freq: Two times a day (BID) | ORAL | Status: DC

## 2014-11-10 MED ORDER — LACTULOSE 10 GM/15ML PO SOLN: 10.0000 g | Freq: Two times a day (BID) | ORAL | Status: DC

## 2014-11-10 MED ORDER — PROPRANOLOL HCL 10 MG PO TABS
10.0000 mg | ORAL_TABLET | Freq: Two times a day (BID) | ORAL | Status: DC
Start: 2014-11-10 — End: 2015-03-24

## 2014-11-10 MED ORDER — SPIRONOLACTONE 50 MG PO TABS: 50.0000 mg | ORAL_TABLET | Freq: Every day | ORAL | Status: DC

## 2014-11-10 NOTE — Care Management Note (Addendum)
Case Management Note  Patient Details  Name: Norris CrossRandall S Indelicato MRN: [id number] Date of Birth: 05/21/1960  Subjective/Objective:            CM following for progression and d/c planning.        Action[facility]Plan: [facility] and [facility]  [facility] also following this pt and aware of plan to d/c this pt to home today.  Expected Discharge Date:      11/10/2014            Expected Discharge Plan:  Home w Home Health Services  In-House Referral:  NA  Discharge planning Services  CM Consult  Post Acute Care Choice:  NA Choice offered to:  NA  DME Arranged:  N/A DME Agency:  NA  HH Arranged:  NA HH Agency:  NA  Status of Service:  Completed, signed off  Medicare Important Message Given:    Date Medicare IM Given:    Medicare IM give by:    Date Additional Medicare IM Given:    Additional Medicare Important Message give by:     If discussed at Long Length of Stay Meetings, dates discussed:    Additional Comments:  Starlyn SkeansRoyal, Alvy Alsop U, RN 11/10/2014, 12:25 PM

## 2014-11-10 NOTE — Discharge Summary (Signed)
Gary [name], is a 54 y.o. male  DOB [birth year]  MRN [id number].  Admission date:  11/05/2014  Admitting Physician  Calvert Cantor, MD  Discharge Date:  11/10/2014   Primary MD  Rodman Key, FNP  Recommendations for primary care physician for things to follow:   Check CBC and CMP within 5-7 days. Must follow with GI closely.   Admission Diagnosis  Peripheral edema [R60.9] Thrombocytopenia (HCC) [D69.6] Cellulitis of right lower extremity [L03.115]   Discharge Diagnosis  Peripheral edema [R60.9] Thrombocytopenia (HCC) [D69.6] Cellulitis of right lower extremity [L03.115]     Principal Problem:   Chronic venous stasis dermatitis of both lower extremities Active Problems:   Cirrhosis (HCC)   Thrombocytopenia (HCC)   Lactic acidosis   Cellulitis      Past Medical History  Diagnosis Date  . Cirrhosis (HCC)   . SDH (subdural hematoma) (HCC)   . Seizure (HCC)   . Pneumonia "several times"  . History of blood transfusion     "related to that gastro thing; took all my dirty blood out and put clean blood back in"  . Hepatitis C   . Headache "10/15-01/2014"    "while my skull healed"  . Anxiety   . Depression   . Chronic anemia     [name] 10/20/2014  . Kidney stones   . Chronic kidney disease, stage 3, mod decreased GFR     [name] 04/09/2011  . GI bleed     [name] 04/08/2014    Past Surgical History  Procedure Laterality Date  . Craniotomy Right 10/21/2013    Procedure: CRANIOTOMY HEMATOMA EVACUATION SUBDURAL;  Surgeon: Maeola Harman, MD;  Location: [facility];  Service: Neurosurgery;  Laterality: Right;  . Tonsillectomy    . Appendectomy    . Abdominal hernia repair    . Hernia repair    . Brain surgery    . Esophagogastroduodenoscopy  04/10/2011    showed diffuse portal gastropathy. No  esophageal varices. No bleeding./notes 04/12/2011       HPI  from the history and physical done on the day of admission:   Gary [name] is a 54 y.o. male with past medical history of hep C and cirrhosis who was admitted recently for right leg cellulitis. He was given IV vancomycin and transitioned to Ancef and discharged on Ceftin.  Patient states that both of his legs have been swollen for a while now however, the swelling did improve during his prior admission. He states that he still has not finished his course of antibiotics and his right leg has began to swell again. His left leg has always been swollen but now the top of his left foot is becoming red and painful. The redness in his right leg improved slightly but never resolved completely. Both legs have been hurting quite a bit.The patient has not had any fevers or chills.     Hospital Course:     Chronic venous stasis dermatitis of both lower extremities, scrotal edema likely due to  low albumin levels from cirrhosis and portal hypertension. Swelling improved, no acute issues. Placed on Aldactone upon discharge. His blood pressures run low and had to place him on lowest possible dose of diuretic. We'll request primary care physician to monitor blood pressure and BMP closely. If room can increase diuresis. No signs of cellulitis.   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 No encephalopathy this admission. Requested him to follow with GI closely postdischarge.   Thrombocytopenia - due to cirrhosis, no acute issues.   Lactic acidosis resolved   ARF. Has Low albumin and completely third spaced, after holding diuresis, placing on midodrine and albumin his creatinine has stabilized and renal function has improved. We'll request PCP to monitor renal function closely.      Discharge  Condition: Stable  Follow UP  Follow-up Information    Follow up with Rodman Key, FNP. Schedule an appointment as soon as possible for a visit in 5 days.   Specialty:  Nurse Practitioner      Follow up with Stan Head, MD. Schedule an appointment as soon as possible for a visit in 1 week.   Specialty:  Gastroenterology   Why:  Hep C Cirrhosis    Contact information:   [address] [place] [address] [phone]        Consults obtained - None  Diet and Activity recommendation: See Discharge Instructions below  Discharge Instructions       Discharge Instructions    Discharge instructions    Complete by:  As directed   Follow with Primary MD Rodman Key, FNP in 5 days   Get CBC, CMP, 2 view Chest X ray checked  by Primary MD next visit.    Activity: As tolerated with Full fall precautions use walker/cane & assistance as needed   Disposition Home     Diet: Heart Healthy  Check your Weight same time everyday, if you gain over 2 pounds, or you develop in leg swelling, experience more shortness of breath or chest pain, call your Primary MD immediately. Follow Cardiac Low Salt Diet and 1.2 lit/day fluid restriction.   On your next visit with your primary care physician please Get Medicines reviewed and adjusted.   Please request your Prim.MD to go over all Hospital Tests and Procedure/Radiological results at the follow up, please get all Hospital records sent to your Prim MD by signing hospital release before you go home.   If you experience worsening of your admission symptoms, develop shortness of breath, life threatening emergency, suicidal or homicidal thoughts you must seek medical attention immediately by calling 911 or calling your MD immediately  if symptoms less severe.  You Must read complete instructions/literature along with all the possible adverse reactions/side effects for all the Medicines you take and that have been prescribed to you.  Take any new Medicines after you have completely understood and accpet all the possible adverse reactions/side effects.   Do not drive, operating heavy machinery, perform activities at heights, swimming or participation in water activities or provide baby sitting services if your were admitted for syncope or siezures until you have seen by Primary MD or a Neurologist and advised to do so again.  Do not drive when taking Pain medications.    Do not take more than prescribed Pain, Sleep and Anxiety Medications  Special Instructions: If you  have smoked or chewed Tobacco  in the last 2 yrs please stop smoking, stop any regular Alcohol  and or any Recreational drug use.  Wear Seat belts while driving.   Please note  You were cared for by a hospitalist during your hospital stay. If you have any questions about your discharge medications or the care you received while you were in the hospital after you are discharged, you can call the unit and asked to speak with the hospitalist on call if the hospitalist that took care of you is not available. Once you are discharged, your primary care physician will handle any further medical issues. Please note that NO REFILLS for any discharge medications will be authorized once you are discharged, as it is imperative that you return to your primary care physician (or establish a relationship with a primary care physician if you do not have one) for your aftercare needs so that they can reassess your need for medications and monitor your lab values.     Increase activity slowly    Complete by:  As directed              Discharge Medications       Medication List    STOP taking these medications        acetaminophen 500 MG tablet  Commonly known as:  TYLENOL     cefUROXime 500 MG tablet  Commonly known as:  CEFTIN      TAKE these medications        DEXILANT 60 MG capsule  Generic drug:  dexlansoprazole  Take 60 mg by mouth daily.      lactulose 10 GM/15ML solution  Commonly known as:  CHRONULAC  Take 15 mLs (10 g total) by mouth 2 (two) times daily.     propranolol 10 MG tablet  Commonly known as:  INDERAL  Take 1 tablet (10 mg total) by mouth 2 (two) times daily.     rifaximin 550 MG Tabs tablet  Commonly known as:  XIFAXAN  Take 1 tablet (550 mg total) by mouth 2 (two) times daily.     spironolactone 50 MG tablet  Commonly known as:  ALDACTONE  Take 1 tablet (50 mg total) by mouth daily.        Major procedures and Radiology Reports - PLEASE review detailed and final reports for all details, in brief -     Dg Chest 2 View  11/05/2014  CLINICAL DATA:  Leg swelling EXAM: CHEST  2 VIEW COMPARISON:  10/20/2014 FINDINGS: No cardiomegaly or failure. The lungs are clear. Negative aortic and hilar contours. IMPRESSION: Negative chest.  No cardiomegaly or failure. Electronically Signed   By: Marnee SpringJonathon  Watts M.D.   On: 11/05/2014 10:22   Dg Chest 2 View  10/20/2014  CLINICAL DATA:  Shortness of breath for 1 week. EXAM: CHEST  2 VIEW COMPARISON:  10/31/2013 FINDINGS: The heart size and mediastinal contours are within normal limits. Both lungs are clear. The visualized skeletal structures are unremarkable. IMPRESSION: No active cardiopulmonary disease. Electronically Signed   By: Charlett NoseKevin  Dover M.D.   On: 10/20/2014 14:01    Micro Results      Recent Results (from the past 240 hour(s))  Blood culture (routine x 2)     Status: None (Preliminary result)   Collection Time: 11/05/14 11:41 AM  Result Value Ref Range Status   Specimen Description BLOOD RIGHT ARM  Final   Special Requests BOTTLES DRAWN AEROBIC AND ANAEROBIC 5CC  Final   Culture NO GROWTH 4 DAYS  Final   Report Status PENDING  Incomplete  Blood culture (routine x 2)     Status: None (Preliminary result)   Collection Time: 11/05/14 11:56 AM  Result Value Ref Range Status   Specimen Description BLOOD LEFT ARM  Final   Special Requests BOTTLES DRAWN  AEROBIC AND ANAEROBIC 4CC  Final   Culture NO GROWTH 4 DAYS  Final   Report Status PENDING  Incomplete       Today   Subjective    Gary [name] today has no headache,no chest abdominal pain,no new weakness tingling or numbness, feels much better wants to go home today.     Objective   Blood pressure 101/56, pulse 68, temperature 98.6 F (37 C), temperature source Oral, resp. rate 17, height  (1.778 m), weight 100.4 kg (221 lb 5.5 oz), SpO2 95 %.   Intake/Output Summary (Last 24 hours) at 11/10/14 0926 Last data filed at 11/10/14 0900  Gross per 24 hour  Intake   1460 ml  Output   1075 ml  Net    385 ml    Exam Awake Alert, Oriented x 3, No new F.N deficits, Normal affect [place].AT,PERRAL Supple Neck,No JVD, No cervical lymphadenopathy appriciated.  Symmetrical Chest wall movement, Good air movement bilaterally, CTAB RRR,No Gallops,Rubs or new Murmurs, No Parasternal Heave +ve B.Sounds, Abd Soft, minimal soft ascites, Non tender, No organomegaly appriciated, No rebound -guarding or rigidity. No Cyanosis, Clubbing, No new Rash or bruise, 2+ lower extremity and scrotal edema   Data Review   CBC w Diff: Lab Results  Component Value Date   WBC 2.5* 11/10/2014   WBC 6.8 10/21/2013   HGB 9.0* 11/10/2014   HGB 13.6 10/21/2013   HCT 26.6* 11/10/2014   HCT 42.2 10/21/2013   PLT 38* 11/10/2014   PLT 68* 10/21/2013   LYMPHOPCT 23 11/05/2014   LYMPHOPCT 41.7 09/29/2012   MONOPCT 18 11/05/2014   MONOPCT 17.1 09/29/2012   EOSPCT 7 11/05/2014   EOSPCT 4.5 09/29/2012   BASOPCT 2 11/05/2014   BASOPCT 1.1 09/29/2012    CMP: Lab Results  Component Value Date   NA 141 11/10/2014   NA 137 10/21/2013   K 3.7 11/10/2014   K 3.6 10/21/2013   CL 109 11/10/2014   CL 106 10/21/2013   CO2 25 11/10/2014   CO2 27 10/21/2013   BUN 17 11/10/2014   BUN 16 10/21/2013   CREATININE 1.27* 11/10/2014   CREATININE 0.91 10/21/2013   PROT 4.9* 11/05/2014   PROT 6.1* 10/21/2013    ALBUMIN 1.8* 11/05/2014   ALBUMIN 2.7* 10/21/2013   BILITOT 6.4* 11/05/2014   BILITOT 5.2* 10/21/2013   ALKPHOS 70 11/05/2014   ALKPHOS 126* 10/21/2013   AST 58* 11/05/2014   AST 83* 10/21/2013   ALT 18 11/05/2014   ALT 41 10/21/2013  .   Total Time in preparing paper work, data evaluation and todays exam - 35 minutes  Leroy Sea M.D on 11/10/2014 at 9:26 AM  [facility]   Office  [phone]

## 2014-11-10 NOTE — Progress Notes (Signed)
This is a late entry.  Patient to be discharged to home via friend. PIV removed. Discharge instructions reviewed with patient by unit AD. Home health arranged by CM. Prescriptions sent with patient.  Leanna BattlesEckelmann, Demita Tobia Eileen, RN.

## 2014-11-10 NOTE — Progress Notes (Signed)
Utilization review completed. Urvi Imes, RN, BSN. 

## 2014-11-10 NOTE — Discharge Instructions (Signed)
Follow with Primary MD Rodman KeyHarrison, Dawn S, FNP in 5 days   Get CBC, CMP, 2 view Chest X ray checked  by Primary MD next visit.    Activity: As tolerated with Full fall precautions use walker/cane & assistance as needed   Disposition Home     Diet: Heart Healthy  Check your Weight same time everyday, if you gain over 2 pounds, or you develop in leg swelling, experience more shortness of breath or chest pain, call your Primary MD immediately. Follow Cardiac Low Salt Diet and 1.2 lit/day fluid restriction.   On your next visit with your primary care physician please Get Medicines reviewed and adjusted.   Please request your Prim.MD to go over all Hospital Tests and Procedure/Radiological results at the follow up, please get all Hospital records sent to your Prim MD by signing hospital release before you go home.   If you experience worsening of your admission symptoms, develop shortness of breath, life threatening emergency, suicidal or homicidal thoughts you must seek medical attention immediately by calling 911 or calling your MD immediately  if symptoms less severe.  You Must read complete instructions/literature along with all the possible adverse reactions/side effects for all the Medicines you take and that have been prescribed to you. Take any new Medicines after you have completely understood and accpet all the possible adverse reactions/side effects.   Do not drive, operating heavy machinery, perform activities at heights, swimming or participation in water activities or provide baby sitting services if your were admitted for syncope or siezures until you have seen by Primary MD or a Neurologist and advised to do so again.  Do not drive when taking Pain medications.    Do not take more than prescribed Pain, Sleep and Anxiety Medications  Special Instructions: If you have smoked or chewed Tobacco  in the last 2 yrs please stop smoking, stop any regular Alcohol  and or any  Recreational drug use.  Wear Seat belts while driving.   Please note  You were cared for by a hospitalist during your hospital stay. If you have any questions about your discharge medications or the care you received while you were in the hospital after you are discharged, you can call the unit and asked to speak with the hospitalist on call if the hospitalist that took care of you is not available. Once you are discharged, your primary care physician will handle any further medical issues. Please note that NO REFILLS for any discharge medications will be authorized once you are discharged, as it is imperative that you return to your primary care physician (or establish a relationship with a primary care physician if you do not have one) for your aftercare needs so that they can reassess your need for medications and monitor your lab values.

## 2015-02-02 ENCOUNTER — Ambulatory Visit: Payer: Medicaid Other | Admitting: Internal Medicine

## 2015-03-18 ENCOUNTER — Inpatient Hospital Stay (HOSPITAL_COMMUNITY)
Admission: EM | Admit: 2015-03-18 | Discharge: 2015-03-24 | DRG: 432 | Disposition: A | Discharge status: Home Health | Payer: Medicaid Other | Attending: Internal Medicine | Admitting: Internal Medicine

## 2015-03-18 ENCOUNTER — Emergency Department (HOSPITAL_COMMUNITY): Payer: Medicaid Other

## 2015-03-18 ENCOUNTER — Encounter (HOSPITAL_COMMUNITY): Payer: Self-pay | Admitting: *Deleted

## 2015-03-18 DIAGNOSIS — S065XAA Traumatic subdural hemorrhage with loss of consciousness status unknown, initial encounter: Secondary | ICD-10-CM

## 2015-03-18 DIAGNOSIS — S065X9A Traumatic subdural hemorrhage with loss of consciousness of unspecified duration, initial encounter: Secondary | ICD-10-CM

## 2015-03-18 DIAGNOSIS — N390 Urinary tract infection, site not specified: Secondary | ICD-10-CM | POA: Diagnosis present

## 2015-03-18 DIAGNOSIS — B182 Chronic viral hepatitis C: Secondary | ICD-10-CM | POA: Diagnosis not present

## 2015-03-18 DIAGNOSIS — Z23 Encounter for immunization: Secondary | ICD-10-CM | POA: Diagnosis not present

## 2015-03-18 DIAGNOSIS — L89321 Pressure ulcer of left buttock, stage 1: Secondary | ICD-10-CM | POA: Diagnosis present

## 2015-03-18 DIAGNOSIS — K7040 Alcoholic hepatic failure without coma: Principal | ICD-10-CM | POA: Diagnosis present

## 2015-03-18 DIAGNOSIS — K7031 Alcoholic cirrhosis of liver with ascites: Secondary | ICD-10-CM | POA: Diagnosis present

## 2015-03-18 DIAGNOSIS — G9341 Metabolic encephalopathy: Secondary | ICD-10-CM | POA: Diagnosis present

## 2015-03-18 DIAGNOSIS — R740 Nonspecific elevation of levels of transaminase and lactic acid dehydrogenase [LDH]: Secondary | ICD-10-CM

## 2015-03-18 DIAGNOSIS — D696 Thrombocytopenia, unspecified: Secondary | ICD-10-CM | POA: Diagnosis present

## 2015-03-18 DIAGNOSIS — W19XXXD Unspecified fall, subsequent encounter: Secondary | ICD-10-CM | POA: Diagnosis present

## 2015-03-18 DIAGNOSIS — L89311 Pressure ulcer of right buttock, stage 1: Secondary | ICD-10-CM | POA: Diagnosis present

## 2015-03-18 DIAGNOSIS — L8990 Pressure ulcer of unspecified site, unspecified stage: Secondary | ICD-10-CM | POA: Diagnosis not present

## 2015-03-18 DIAGNOSIS — R4182 Altered mental status, unspecified: Secondary | ICD-10-CM | POA: Diagnosis present

## 2015-03-18 DIAGNOSIS — D649 Anemia, unspecified: Secondary | ICD-10-CM | POA: Diagnosis present

## 2015-03-18 DIAGNOSIS — R7401 Elevation of levels of liver transaminase levels: Secondary | ICD-10-CM | POA: Diagnosis present

## 2015-03-18 DIAGNOSIS — R188 Other ascites: Secondary | ICD-10-CM

## 2015-03-18 DIAGNOSIS — B1920 Unspecified viral hepatitis C without hepatic coma: Secondary | ICD-10-CM | POA: Diagnosis present

## 2015-03-18 DIAGNOSIS — Z803 Family history of malignant neoplasm of breast: Secondary | ICD-10-CM | POA: Diagnosis not present

## 2015-03-18 DIAGNOSIS — S065X0D Traumatic subdural hemorrhage without loss of consciousness, subsequent encounter: Secondary | ICD-10-CM

## 2015-03-18 DIAGNOSIS — K7460 Unspecified cirrhosis of liver: Secondary | ICD-10-CM | POA: Diagnosis not present

## 2015-03-18 DIAGNOSIS — F10239 Alcohol dependence with withdrawal, unspecified: Secondary | ICD-10-CM | POA: Diagnosis present

## 2015-03-18 DIAGNOSIS — I6200 Nontraumatic subdural hemorrhage, unspecified: Secondary | ICD-10-CM | POA: Diagnosis not present

## 2015-03-18 DIAGNOSIS — Z9119 Patient's noncompliance with other medical treatment and regimen: Secondary | ICD-10-CM | POA: Diagnosis not present

## 2015-03-18 DIAGNOSIS — F418 Other specified anxiety disorders: Secondary | ICD-10-CM | POA: Diagnosis present

## 2015-03-18 DIAGNOSIS — R569 Unspecified convulsions: Secondary | ICD-10-CM

## 2015-03-18 DIAGNOSIS — F1290 Cannabis use, unspecified, uncomplicated: Secondary | ICD-10-CM | POA: Diagnosis present

## 2015-03-18 DIAGNOSIS — F329 Major depressive disorder, single episode, unspecified: Secondary | ICD-10-CM | POA: Diagnosis present

## 2015-03-18 HISTORY — DX: Other specified anxiety disorders: F41.8

## 2015-03-18 HISTORY — DX: Other diseases of stomach and duodenum: K31.89

## 2015-03-18 HISTORY — DX: Cellulitis, unspecified: L03.90

## 2015-03-18 HISTORY — DX: Acute kidney failure, unspecified: N17.9

## 2015-03-18 HISTORY — DX: Portal hypertension: K76.6

## 2015-03-18 HISTORY — DX: Thrombocytopenia, unspecified: D69.6

## 2015-03-18 HISTORY — DX: Venous insufficiency (chronic) (peripheral): I87.2

## 2015-03-18 HISTORY — DX: Other ascites: R18.8

## 2015-03-18 HISTORY — DX: Hepatic failure, unspecified without coma: K72.90

## 2015-03-18 HISTORY — DX: Coagulation defect, unspecified: D68.9

## 2015-03-18 LAB — CBC
HEMATOCRIT: 38.3 % — AB (ref 39.0–52.0)
Hemoglobin: 13.1 g/dL (ref 13.0–17.0)
MCH: 35.6 pg — AB (ref 26.0–34.0)
MCHC: 34.2 g/dL (ref 30.0–36.0)
MCV: 104.1 fL — AB (ref 78.0–100.0)
Platelets: 63 10*3/uL — ABNORMAL LOW (ref 150–400)
RBC: 3.68 MIL/uL — AB (ref 4.22–5.81)
RDW: 15.9 % — ABNORMAL HIGH (ref 11.5–15.5)
WBC: 7.3 10*3/uL (ref 4.0–10.5)

## 2015-03-18 LAB — COMPREHENSIVE METABOLIC PANEL
ALK PHOS: 105 U/L (ref 38–126)
ALT: 60 U/L (ref 17–63)
AST: 158 U/L — AB (ref 15–41)
Albumin: 2.7 g/dL — ABNORMAL LOW (ref 3.5–5.0)
Anion gap: 13 (ref 5–15)
BILIRUBIN TOTAL: 13.6 mg/dL — AB (ref 0.3–1.2)
BUN: 14 mg/dL (ref 6–20)
CALCIUM: 9.1 mg/dL (ref 8.9–10.3)
CO2: 23 mmol/L (ref 22–32)
CREATININE: 0.92 mg/dL (ref 0.61–1.24)
Chloride: 102 mmol/L (ref 101–111)
Glucose, Bld: 97 mg/dL (ref 65–99)
Potassium: 4.6 mmol/L (ref 3.5–5.1)
Sodium: 138 mmol/L (ref 135–145)
TOTAL PROTEIN: 6.7 g/dL (ref 6.5–8.1)

## 2015-03-18 LAB — URINE MICROSCOPIC-ADD ON

## 2015-03-18 LAB — RAPID URINE DRUG SCREEN, HOSP PERFORMED
Amphetamines: NOT DETECTED
Barbiturates: NOT DETECTED
Benzodiazepines: NOT DETECTED
COCAINE: NOT DETECTED
OPIATES: NOT DETECTED
TETRAHYDROCANNABINOL: POSITIVE — AB

## 2015-03-18 LAB — ETHANOL: Alcohol, Ethyl (B): <5 mg/dL (ref <5)

## 2015-03-18 LAB — URINALYSIS, ROUTINE W REFLEX MICROSCOPIC
Glucose, UA: NEGATIVE mg/dL
KETONES UR: 15 mg/dL — AB
NITRITE: POSITIVE — AB
PROTEIN: NEGATIVE mg/dL
Specific Gravity, Urine: 1.029 (ref 1.005–1.030)
pH: 5.5 (ref 5.0–8.0)

## 2015-03-18 LAB — I-STAT TROPONIN, ED: TROPONIN I, POC: 0.06 ng/mL (ref 0.00–0.08)

## 2015-03-18 LAB — AMMONIA: AMMONIA: 39 umol/L — AB (ref 9–35)

## 2015-03-18 LAB — CK: CK TOTAL: 253 U/L (ref 49–397)

## 2015-03-18 MED ORDER — DEXTROSE 5 % IV SOLN
1.0000 g | INTRAVENOUS | Status: DC
  Filled 2015-03-18: qty 10

## 2015-03-18 MED ORDER — HEPARIN SODIUM (PORCINE) 5000 UNIT/ML IJ SOLN: 5000.0000 [IU] | Freq: Three times a day (TID) | INTRAMUSCULAR | Status: DC

## 2015-03-18 MED ORDER — PANTOPRAZOLE SODIUM 40 MG PO TBEC: 40.0000 mg | DELAYED_RELEASE_TABLET | Freq: Every day | ORAL | Status: DC

## 2015-03-18 MED ORDER — PROPRANOLOL HCL 10 MG PO TABS
10.0000 mg | ORAL_TABLET | Freq: Two times a day (BID) | ORAL | Status: DC
  Administered 2015-03-19 – 2015-03-20 (×3): 10 mg via ORAL
  Filled 2015-03-18 (×6): qty 1

## 2015-03-18 MED ORDER — RIFAXIMIN 550 MG PO TABS
550.0000 mg | ORAL_TABLET | Freq: Two times a day (BID) | ORAL | Status: DC
  Administered 2015-03-19 – 2015-03-24 (×11): 550 mg via ORAL
  Filled 2015-03-18 (×11): qty 1

## 2015-03-18 MED ORDER — DEXTROSE 5 % IV SOLN
1.0000 g | Freq: Once | INTRAVENOUS | Status: AC
Start: 2015-03-18 — End: 2015-03-18
  Administered 2015-03-18: 1 g via INTRAVENOUS
  Filled 2015-03-18: qty 10

## 2015-03-18 MED ORDER — LORAZEPAM 2 MG/ML IJ SOLN
1.0000 mg | Freq: Once | INTRAMUSCULAR | Status: AC
  Administered 2015-03-18: 1 mg via INTRAVENOUS
  Filled 2015-03-18: qty 1

## 2015-03-18 MED ORDER — LACTULOSE 10 GM/15ML PO SOLN
10.0000 g | Freq: Two times a day (BID) | ORAL | Status: DC
  Administered 2015-03-19 – 2015-03-24 (×11): 10 g via ORAL
  Filled 2015-03-18 (×11): qty 15

## 2015-03-18 NOTE — ED Notes (Signed)
Pt came from home.  Family reports altered mental status with hx of same with high amonia levels.  Hx hep C.  Pt family has no further information to give.  Ems 134/88, 100, 16, 98%RA, Pt following rn with eyes but will not answer questions.

## 2015-03-18 NOTE — ED Provider Notes (Signed)
CSN: [id number]     Arrival date & time 03/18/15  1629 History   First MD Initiated Contact with Patient 03/18/15 1637     Chief Complaint  Patient presents with  . Altered Mental Status   Level 5 caveat: altered mental status  HPI  MARTICE DOTY is a 55 y.o. male PMH significant for cirrhosis, subdural hematoma, seizures, hepatitis C, depression, chronic kidney disease stage III presenting with a 1 day history of altered mental status. His son, via telephone encounter, states his last normal was yesterday morning. He states the patient later appeared to be altered when he would take out his wallet and folded and unfolded in his hands. He states later on he would not see the family, walked out to the road, was later coaxed back towards the patient's house, and his son thinks he may have fallen last night. His son also mentions that today he found a bottle [place] near the beer that he thinks the patient was drinking but he doubts that the patient added this to his beer. His his son endorses a history of all terminal status with previous hepatic encephalopathy episode. Per RN, patient has been off of liver meds for "months." Unable to obtain further history of present illness due to patient's altered mental status.  Past Medical History  Diagnosis Date  . Cirrhosis (HCC)   . SDH (subdural hematoma) (HCC)   . Seizure (HCC)   . Pneumonia "several times"  . History of blood transfusion     "related to that gastro thing; took all my dirty blood out and put clean blood back in"  . Hepatitis C   . Headache "10/15-01/2014"    "while my skull healed"  . Anxiety   . Depression   . Chronic anemia     [name] 10/20/2014  . Kidney stones   . Chronic kidney disease, stage 3, mod decreased GFR     [name] 04/09/2011  . GI bleed     [name] 04/08/2014   Past Surgical History  Procedure Laterality Date  . Craniotomy Right 10/21/2013    Procedure: CRANIOTOMY HEMATOMA EVACUATION SUBDURAL;  Surgeon:  Maeola Harman, MD;  Location: [facility];  Service: Neurosurgery;  Laterality: Right;  . Tonsillectomy    . Appendectomy    . Abdominal hernia repair    . Hernia repair    . Brain surgery    . Esophagogastroduodenoscopy  04/10/2011    showed diffuse portal gastropathy. No esophageal varices. No bleeding./notes 04/12/2011   Family History  Problem Relation Age of Onset  . Cirrhosis Neg Hx   . Seizures Neg Hx    Social History  Substance Use Topics  . Smoking status: Never Smoker   . Smokeless tobacco: Never Used  . Alcohol Use: 1.2 oz/week    2 Cans of beer per week     Comment: "I've been to detox"    Review of Systems  Unable to perform ROS: Mental status change    Allergies  Review of patient's allergies indicates no known allergies.  Home Medications   Prior to Admission medications   Medication Sig Start Date End Date Taking? Authorizing Provider  lactulose (CHRONULAC) 10 GM/15ML solution Take 15 mLs (10 g total) by mouth 2 (two) times daily. Patient not taking: Reported on 03/18/2015 11/10/14   Leroy Sea, MD  propranolol (INDERAL) 10 MG tablet Take 1 tablet (10 mg total) by mouth 2 (two) times daily. Patient not taking: Reported on 03/18/2015 11/10/14  Leroy Sea, MD  rifaximin (XIFAXAN) 550 MG TABS tablet Take 1 tablet (550 mg total) by mouth 2 (two) times daily. Patient not taking: Reported on 03/18/2015 11/10/14   Leroy Sea, MD  spironolactone (ALDACTONE) 50 MG tablet Take 1 tablet (50 mg total) by mouth daily. Patient not taking: Reported on 03/18/2015 11/10/14   Leroy Sea, MD   There were no vitals taken for this visit. Physical Exam  Constitutional: He appears well-developed and well-nourished. No distress.  Disheveled  HENT:  Head: Normocephalic and atraumatic.  Oropharynx dry.   Eyes: Pupils are equal, round, and reactive to light. Right eye exhibits no discharge. Left eye exhibits no discharge. Scleral icterus is present.   Cardiovascular: Normal rate, regular rhythm, normal heart sounds and intact distal pulses.  Exam reveals no gallop and no friction rub.   No murmur heard. Pulmonary/Chest: Effort normal and breath sounds normal. No respiratory distress. He has no wheezes. He has no rales. He exhibits no tenderness.  Abdominal: Soft. Bowel sounds are normal. He exhibits distension. He exhibits no mass. There is no tenderness. There is no rebound and no guarding.  Musculoskeletal: He exhibits no edema.  Lymphadenopathy:    He has no cervical adenopathy.  Neurological: He is alert. Coordination normal.  Oriented to place  Skin: Skin is warm and dry. No rash noted. He is not diaphoretic. No erythema.  Jaundiced  Psychiatric: He has a normal mood and affect. His behavior is normal.  Nursing note and vitals reviewed.   ED Course  Procedures  Labs Review Labs Reviewed  CBC - Abnormal; Notable for the following:    RBC 3.68 (*)    HCT 38.3 (*)    MCV 104.1 (*)    MCH 35.6 (*)    RDW 15.9 (*)    Platelets 63 (*)    All other components within normal limits  COMPREHENSIVE METABOLIC PANEL - Abnormal; Notable for the following:    Albumin 2.7 (*)    AST 158 (*)    Total Bilirubin 13.6 (*)    All other components within normal limits  URINALYSIS, ROUTINE W REFLEX MICROSCOPIC (NOT AT [facility])  AMMONIA  I-STAT TROPOININ, ED   Imaging Review Dg Chest 2 View  03/18/2015  CLINICAL DATA:  Altered mental status. EXAM: CHEST  2 VIEW COMPARISON:  11/05/2014 FINDINGS: Heart, mediastinum hila are unremarkable. Lung volumes are low. Lungs are clear. No pleural effusion or pneumothorax. Bony thorax is intact. IMPRESSION: No active cardiopulmonary disease. Electronically Signed   By: Amie Portland M.D.   On: 03/18/2015 17:56   Ct Head Wo Contrast  03/18/2015  CLINICAL DATA:  55 year old male with acute altered mental status. EXAM: CT HEAD WITHOUT CONTRAST TECHNIQUE: Contiguous axial images were obtained from the base of  the skull through the vertex without intravenous contrast. COMPARISON:  10/31/2013 and prior CTs FINDINGS: Atrophy and chronic small-vessel white matter ischemic changes are again identified. A low-density 2 mm right frontal subdural collection is compatible with residual changes from prior subdural hematoma. There is no evidence of acute infarction, hemorrhage, new extra-axial collection, midline shift, hydrocephalus or mass lesion. No acute bony abnormalities are identified. Right frontal craniotomy changes are present. IMPRESSION: No evidence of acute intracranial abnormality. Atrophy, chronic small-vessel white matter ischemic changes and 2 mm chronic right frontal subdural collections/changes. Electronically Signed   By: Harmon Pier M.D.   On: 03/18/2015 20:36   I have personally reviewed and evaluated these images and lab results  as part of my medical decision-making.   EKG Interpretation   Date/Time:  Thursday March 18 2015 17:00:35 EST Ventricular Rate:  93 PR Interval:    QRS Duration: 102 QT Interval:  366 QTC Calculation: 455 R Axis:   73 Text Interpretation:  Atrial fibrillation Low voltage, precordial leads  Borderline T abnormalities, inferior leads Confirmed by GOLDSTON  MD,  SCOTT ([id number]) on 03/18/2015 6:47:20 PM      MDM   Final diagnoses:  AMS   Will perform broad AMS workup for infectious vs neuro vs cardiac.  Urinalysis with nitrite positive urine and moderate hemoglobin, large bili, orange color. Nitrite positive may be secondary due to bili and orange color; however, will give 1 g of Rocephin in ED. UDS positive for THC. Troponin, CBC, EtOH unremarkable. Ammonia slightly elevated at 39 AST elevated at 158, ALT normal at 60. CT head, chest x-ray unremarkable for acute change. EKG with A. fib, borderline T-wave abnormalities in inferior leads. Patient will need admission for further altered mental status workup. Hospitalist agrees to admission and wants CT  abdomen/pelvis with contrast.   Melton Krebs, PA-C 03/20/15 1309  Pricilla Loveless, MD 03/20/15 1622

## 2015-03-18 NOTE — ED Notes (Signed)
CT called to inquire about length of time before study;

## 2015-03-18 NOTE — ED Notes (Signed)
Pt had dried feces on his bottom and legs. Pt wiped with soap and water. Pt's bedding and bed pads were changed.

## 2015-03-18 NOTE — ED Notes (Signed)
Pt off unit with xray 

## 2015-03-18 NOTE — H&P (Signed)
PCP:   No primary care provider on file.   Chief Complaint:  Confusion  HPI: This is a 55 year old gentleman who was sent in to the ER, weight a neighbor or friend noted to be confused. This was initially noted yesterday, the patient was not acting correctly. This is much worse today. Per chart the patient has not been taking his medications. The patient is not able to give any information because he is confused. History provided mainly by the chart.   Review of Systems:  Unable to do due to patient's confusional states.  Past Medical History: Past Medical History  Diagnosis Date  . Cirrhosis (HCC)   . SDH (subdural hematoma) (HCC)   . Seizure (HCC)   . Pneumonia "several times"  . History of blood transfusion     "related to that gastro thing; took all my dirty blood out and put clean blood back in"  . Hepatitis C   . Headache "10/15-01/2014"    "while my skull healed"  . Anxiety   . Depression   . Chronic anemia     [name] 10/20/2014  . Kidney stones   . Chronic kidney disease, stage 3, mod decreased GFR     [name] 04/09/2011  . GI bleed     [name] 04/08/2014   Past Surgical History  Procedure Laterality Date  . Craniotomy Right 10/21/2013    Procedure: CRANIOTOMY HEMATOMA EVACUATION SUBDURAL;  Surgeon: Maeola Harman, MD;  Location: [facility];  Service: Neurosurgery;  Laterality: Right;  . Tonsillectomy    . Appendectomy    . Abdominal hernia repair    . Hernia repair    . Brain surgery    . Esophagogastroduodenoscopy  04/10/2011    showed diffuse portal gastropathy. No esophageal varices. No bleeding./notes 04/12/2011    Medications: Prior to Admission medications   Medication Sig Start Date End Date Taking? Authorizing Provider  lactulose (CHRONULAC) 10 GM/15ML solution Take 15 mLs (10 g total) by mouth 2 (two) times daily. Patient not taking: Reported on 03/18/2015 11/10/14   Leroy Sea, MD  propranolol (INDERAL) 10 MG tablet Take 1 tablet (10 mg total) by  mouth 2 (two) times daily. Patient not taking: Reported on 03/18/2015 11/10/14   Leroy Sea, MD  rifaximin (XIFAXAN) 550 MG TABS tablet Take 1 tablet (550 mg total) by mouth 2 (two) times daily. Patient not taking: Reported on 03/18/2015 11/10/14   Leroy Sea, MD  spironolactone (ALDACTONE) 50 MG tablet Take 1 tablet (50 mg total) by mouth daily. Patient not taking: Reported on 03/18/2015 11/10/14   Leroy Sea, MD    Allergies:  No Known Allergies  Social History:  reports that he has never smoked. He has never used smokeless tobacco. He reports that he drinks about 1.2 oz of alcohol per week. He reports that he uses illicit drugs (Cocaine, Marijuana, and Other-see comments).  Family History: Family History  Problem Relation Age of Onset  . Cirrhosis Neg Hx   . Seizures Neg Hx     Physical Exam: Filed Vitals:   03/18/15 2130 03/18/15 2145 03/18/15 2200 03/18/15 2215  BP: 134/81 120/76 135/79 136/82  Pulse: 95 93 100 98  Temp:      TempSrc:      Resp: SpO2: 92% 93% 96% 92%    General: Disheveled, confused and incontinent male Eyes: positive scleral icterus ENT: Moist oral mucosa, neck supple, no thyromegaly Lungs: clear to ascultation, no wheeze, no  crackles, no use of accessory muscles Cardiovascular: regular rate and rhythm, no regurgitation, no gallops, no murmurs. No carotid bruits, no JVD Abdomen: soft, positive BS, non-tender, full abdomen, not an acute abdomen GU: not examined Neuro: Unable to assess due to patient's confusional state Musculoskeletal: Unable to assess strength due to patient's confusional state, 3+ bilateral lower extremity pitting edema Skin: no rash, no subcutaneous crepitation, no decubitus Psych: Unable to assess due to patient's mentation   Labs on Admission:   Recent Labs  03/18/15 1721  NA 138  K 4.6  CL 102  CO2 23  GLUCOSE 97  BUN 14  CREATININE 0.92  CALCIUM 9.1    Recent Labs  03/18/15 1721  AST  158*  ALT 60  ALKPHOS 105  BILITOT 13.6*  PROT 6.7  ALBUMIN 2.7*   No results for input(s): LIPASE, AMYLASE in the last 72 hours.  Recent Labs  03/18/15 1721  WBC 7.3  HGB 13.1  HCT 38.3*  MCV 104.1*  PLT 63*    Results for Gary [name], Gary [name] (MRN [id number]) as of 03/18/2015 23:00  Ref. Range 03/18/2015 19:18  Amphetamines Latest Ref Range: NONE DETECTED  NONE DETECTED  Barbiturates Latest Ref Range: NONE DETECTED  NONE DETECTED  Benzodiazepines Latest Ref Range: NONE DETECTED  NONE DETECTED  Opiates Latest Ref Range: NONE DETECTED  NONE DETECTED  COCAINE Latest Ref Range: NONE DETECTED  NONE DETECTED  Tetrahydrocannabinol Latest Ref Range: NONE DETECTED  POSITIVE (A)     Recent Labs  03/18/15 2214  CKTOTAL 253   Invalid input(s): POCBNP No results for input(s): DDIMER in the last 72 hours. No results for input(s): HGBA1C in the last 72 hours. No results for input(s): CHOL, HDL, LDLCALC, TRIG, CHOLHDL, LDLDIRECT in the last 72 hours. No results for input(s): TSH, T4TOTAL, T3FREE, THYROIDAB in the last 72 hours.  Invalid input(s): FREET3 No results for input(s): VITAMINB12, FOLATE, FERRITIN, TIBC, IRON, RETICCTPCT in the last 72 hours.  Micro Results: No results found for this or any previous visit (from the past 240 hour(s)).   Radiological Exams on Admission: Dg Chest 2 View  03/18/2015  CLINICAL DATA:  Altered mental status. EXAM: CHEST  2 VIEW COMPARISON:  11/05/2014 FINDINGS: Heart, mediastinum hila are unremarkable. Lung volumes are low. Lungs are clear. No pleural effusion or pneumothorax. Bony thorax is intact. IMPRESSION: No active cardiopulmonary disease. Electronically Signed   By: Amie Portland M.D.   On: 03/18/2015 17:56   Ct Head Wo Contrast  03/18/2015  CLINICAL DATA:  55 year old male with acute altered mental status. EXAM: CT HEAD WITHOUT CONTRAST TECHNIQUE: Contiguous axial images were obtained from the base of the skull through the vertex without  intravenous contrast. COMPARISON:  10/31/2013 and prior CTs FINDINGS: Atrophy and chronic small-vessel white matter ischemic changes are again identified. A low-density 2 mm right frontal subdural collection is compatible with residual changes from prior subdural hematoma. There is no evidence of acute infarction, hemorrhage, new extra-axial collection, midline shift, hydrocephalus or mass lesion. No acute bony abnormalities are identified. Right frontal craniotomy changes are present. IMPRESSION: No evidence of acute intracranial abnormality. Atrophy, chronic small-vessel white matter ischemic changes and 2 mm chronic right frontal subdural collections/changes. Electronically Signed   By: Harmon Pier M.D.   On: 03/18/2015 20:36    Assessment/Plan Present on Admission:  . Metabolic encephalopathy -Admit to MedSurg -Unclear course the patient's metabolic encephalopathy. Patient will possible UTI, we'll treat with IV Rocephin and  await cultures -Patient has  not been taking his medication, will resume medications and see the patient's mentation improved . Transaminitis -Patient is AST and total bili are both normal. Monitor, if increasing we'll obtain a GI consult  -CT abdomen and pelvis ordered, results spelled -Hepatitis panel ordered.  . Cirrhosis (HCC) . HCV (hepatitis C virus) -Monitor  Thrombocytopenia  -Chronic, unchanged. Monitor  -No Lovenox for DVT prophylaxis    Ninette Cotta 03/18/2015, 10:56 PM

## 2015-03-18 NOTE — ED Notes (Signed)
Admitting MD

## 2015-03-19 ENCOUNTER — Inpatient Hospital Stay (HOSPITAL_COMMUNITY): Payer: Medicaid Other

## 2015-03-19 ENCOUNTER — Encounter (HOSPITAL_COMMUNITY): Payer: Self-pay | Admitting: Physician Assistant

## 2015-03-19 DIAGNOSIS — R740 Nonspecific elevation of levels of transaminase and lactic acid dehydrogenase [LDH]: Secondary | ICD-10-CM

## 2015-03-19 DIAGNOSIS — L8990 Pressure ulcer of unspecified site, unspecified stage: Secondary | ICD-10-CM | POA: Insufficient documentation

## 2015-03-19 LAB — COMPREHENSIVE METABOLIC PANEL
ALBUMIN: 2.4 g/dL — AB (ref 3.5–5.0)
ALK PHOS: 87 U/L (ref 38–126)
ALT: 64 U/L — ABNORMAL HIGH (ref 17–63)
ANION GAP: 10 (ref 5–15)
AST: 177 U/L — ABNORMAL HIGH (ref 15–41)
BUN: 16 mg/dL (ref 6–20)
CALCIUM: 8.8 mg/dL — AB (ref 8.9–10.3)
CHLORIDE: 101 mmol/L (ref 101–111)
CO2: 25 mmol/L (ref 22–32)
Creatinine, Ser: 0.8 mg/dL (ref 0.61–1.24)
GFR calc non Af Amer: >60 mL/min (ref >60)
GLUCOSE: 94 mg/dL (ref 65–99)
POTASSIUM: 4.3 mmol/L (ref 3.5–5.1)
SODIUM: 136 mmol/L (ref 135–145)
Total Bilirubin: 13.8 mg/dL — ABNORMAL HIGH (ref 0.3–1.2)
Total Protein: 6 g/dL — ABNORMAL LOW (ref 6.5–8.1)

## 2015-03-19 LAB — CBC
HEMATOCRIT: 35.8 % — AB (ref 39.0–52.0)
HEMOGLOBIN: 12.3 g/dL — AB (ref 13.0–17.0)
MCH: 35.4 pg — AB (ref 26.0–34.0)
MCHC: 34.4 g/dL (ref 30.0–36.0)
MCV: 103.2 fL — AB (ref 78.0–100.0)
Platelets: 52 10*3/uL — ABNORMAL LOW (ref 150–400)
RBC: 3.47 MIL/uL — AB (ref 4.22–5.81)
RDW: 16.1 % — ABNORMAL HIGH (ref 11.5–15.5)
WBC: 6.2 10*3/uL (ref 4.0–10.5)

## 2015-03-19 LAB — BILIRUBIN, FRACTIONATED(TOT/DIR/INDIR)
BILIRUBIN DIRECT: 6 mg/dL — AB (ref 0.1–0.5)
BILIRUBIN INDIRECT: 7.2 mg/dL — AB (ref 0.3–0.9)
Total Bilirubin: 13.2 mg/dL — ABNORMAL HIGH (ref 0.3–1.2)

## 2015-03-19 LAB — AMMONIA: AMMONIA: 40 umol/L — AB (ref 9–35)

## 2015-03-19 LAB — PROTIME-INR
INR: 3.09 — ABNORMAL HIGH (ref 0.00–1.49)
PROTHROMBIN TIME: 31.3 s — AB (ref 11.6–15.2)

## 2015-03-19 MED ORDER — CEFTRIAXONE SODIUM 2 G IJ SOLR
2.0000 g | INTRAMUSCULAR | Status: DC
  Administered 2015-03-19 – 2015-03-23 (×5): 2 g via INTRAVENOUS
  Filled 2015-03-19 (×6): qty 2

## 2015-03-19 MED ORDER — LORAZEPAM 1 MG PO TABS
1.0000 mg | ORAL_TABLET | Freq: Four times a day (QID) | ORAL | Status: AC | PRN
  Administered 2015-03-20 – 2015-03-21 (×2): 1 mg via ORAL
  Filled 2015-03-19 (×2): qty 1

## 2015-03-19 MED ORDER — FOLIC ACID 1 MG PO TABS
1.0000 mg | ORAL_TABLET | Freq: Every day | ORAL | Status: DC
  Administered 2015-03-19 – 2015-03-24 (×6): 1 mg via ORAL
  Filled 2015-03-19 (×6): qty 1

## 2015-03-19 MED ORDER — VITAMIN B-1 100 MG PO TABS
100.0000 mg | ORAL_TABLET | Freq: Every day | ORAL | Status: DC
  Administered 2015-03-19 – 2015-03-24 (×5): 100 mg via ORAL
  Filled 2015-03-19 (×5): qty 1

## 2015-03-19 MED ORDER — LORAZEPAM 2 MG/ML IJ SOLN
0.0000 mg | Freq: Four times a day (QID) | INTRAMUSCULAR | Status: AC
  Administered 2015-03-19: 2 mg via INTRAVENOUS
  Administered 2015-03-20: 1 mg via INTRAVENOUS
  Filled 2015-03-19 (×3): qty 1

## 2015-03-19 MED ORDER — IOHEXOL 300 MG/ML  SOLN
25.0000 mL | INTRAMUSCULAR | Status: AC
  Administered 2015-03-19 (×2): 25 mL via ORAL

## 2015-03-19 MED ORDER — THIAMINE HCL 100 MG/ML IJ SOLN
100.0000 mg | Freq: Every day | INTRAMUSCULAR | Status: DC
  Administered 2015-03-22: 100 mg via INTRAVENOUS
  Filled 2015-03-19 (×2): qty 2

## 2015-03-19 MED ORDER — LORAZEPAM 2 MG/ML IJ SOLN
0.0000 mg | Freq: Two times a day (BID) | INTRAMUSCULAR | Status: AC
Start: 2015-03-21 — End: 2015-03-23
  Administered 2015-03-22: 1 mg via INTRAVENOUS
  Filled 2015-03-19: qty 1

## 2015-03-19 MED ORDER — IOHEXOL 300 MG/ML  SOLN
100.0000 mL | Freq: Once | INTRAMUSCULAR | Status: AC | PRN
  Administered 2015-03-19: 100 mL via INTRAVENOUS

## 2015-03-19 MED ORDER — PANTOPRAZOLE SODIUM 40 MG IV SOLR
40.0000 mg | Freq: Every day | INTRAVENOUS | Status: DC
  Administered 2015-03-19 – 2015-03-23 (×6): 40 mg via INTRAVENOUS
  Filled 2015-03-19 (×6): qty 40

## 2015-03-19 MED ORDER — LORAZEPAM 2 MG/ML IJ SOLN: 1.0000 mg | Freq: Four times a day (QID) | INTRAMUSCULAR | Status: AC | PRN

## 2015-03-19 MED ORDER — ADULT MULTIVITAMIN W/MINERALS CH
1.0000 | ORAL_TABLET | Freq: Every day | ORAL | Status: DC
  Administered 2015-03-19 – 2015-03-24 (×6): 1 via ORAL
  Filled 2015-03-19 (×6): qty 1

## 2015-03-19 NOTE — Progress Notes (Signed)
Pt was admitted from ED per stretcher accompanied by a tech, pt came into admission with altered mental status, very confused only oriented to his name,pt  was drowsy, pt education not done due to his confusion, all his PO med are on hold per MD CROSLEY D, order, since pt is on NPO, pt was supposed to received iv rocephen, i sent massage to pharmacy to tube med to the floor but pharmacy send a massage that pt has already received it at ED before he was brought to the unit to be admitted, i called pharmacy on phone to varrified the massage sent, will continues to monitor

## 2015-03-19 NOTE — Consult Note (Signed)
[facility] Reason for consult: hepatic encephalopathy Referring Physician: [facility] hospitalist. Patient has no PCP.  Gary [name] is an 55 y.o. male.  HPI: he was brought into the emergency room by a friend because he was confused and acting inappropriately. Labs revealed signs of significant liver disease with slightly elevated ammonia and marked increase in bilirubin of 13.6 with only slight increase in transaminase. Platelet count low 63. Urine drug screen positive for marijuana. CT of the head negative CT of the abdomen showed cirrhosis with splenomegaly cholelithiasis with varices and marked ascites. Patient started empirically on Rocephin lactulose Xifaxan and Protonix. Most of the history obtained by review of his medical records which indicates history of cirrhosis secondary extensive chronic alcohol use and hepatitis C, history of subdural hematoma due to all, history of CKD stage III, in the past appointments were made for GI due to abnormal liver test. But appointment apparently was never kept. Patient says that he has cut his drinking back significantly and now drinks only a 12 pack a day. Patient has a positive hepatitis C antibody but unable to find a viral load . Past Medical History  Diagnosis Date  . Cirrhosis ([place]) dx prior to 2013    due to ETOH and Hep C.   . SDH (subdural hematoma) ([place]) 10/2013  . Seizure ([place]) 2015  . Pneumonia "several times"  . History of blood transfusion     got FFP and platelets 10/2013.   Marland Kitchen Hepatitis C dx prior to 2013    genotype 2.   . Depression with anxiety   . Chronic anemia     Archie Endo 10/20/2014  . Kidney stones   . AKI (acute kidney injury) ([place]) 03/2011, 10/2014    bounces back to normal GFR post AKI.   . GI bleed 03/2011    hematemesis  . Portal hypertensive gastropathy 03/2011    EGD: portal htn, no varices. Dr Verdie Shire in [place].   . Thrombocytopenia ([place]) 03/2011  . Ascites 2014  . Coagulopathy ([place]) 03/2011   . Cellulitis 10/2014    Right LE  . Venous stasis dermatitis of both lower extremities 10/2014  . Hepatic encephalopathy ([place]) 10/2013    treatment with Rifaximin, lactulose.     Past Surgical History  Procedure Laterality Date  . Craniotomy Right 10/21/2013    Procedure: CRANIOTOMY HEMATOMA EVACUATION SUBDURAL;  Surgeon: Erline Levine, MD;  Location: [place] [facility];  Service: Neurosurgery;  Laterality: Right;  . Tonsillectomy    . Appendectomy    . Abdominal hernia repair    . Esophagogastroduodenoscopy  04/10/2011    Dr Eddie Dibbles Oh: diffuse portal gastropathy. No esophageal varices. No bleeding.    Family History  Problem Relation Age of Onset  . Cirrhosis Neg Hx   . Seizures Neg Hx   . Cancer Mother     breast    Social History:  reports that he has never smoked. He has never used smokeless tobacco. He reports that he drinks about 1.2 oz of alcohol per week. He reports that he uses illicit drugs (Cocaine, Marijuana, and Other-see comments).  Allergies: No Known Allergies  Medications; Prior to Admission medications   Medication Sig Start Date End Date Taking? Authorizing Provider  lactulose (CHRONULAC) 10 GM/15ML solution Take 15 mLs (10 g total) by mouth 2 (two) times daily. Patient not taking: Reported on 03/18/2015 11/10/14   Thurnell Lose, MD  propranolol (INDERAL) 10 MG tablet Take 1 tablet (10 mg total) by mouth  2 (two) times daily. Patient not taking: Reported on 03/18/2015 11/10/14   Thurnell Lose, MD  rifaximin (XIFAXAN) 550 MG TABS tablet Take 1 tablet (550 mg total) by mouth 2 (two) times daily. Patient not taking: Reported on 03/18/2015 11/10/14   Thurnell Lose, MD  spironolactone (ALDACTONE) 50 MG tablet Take 1 tablet (50 mg total) by mouth daily. Patient not taking: Reported on 03/18/2015 11/10/14   Thurnell Lose, MD   . cefTRIAXone (ROCEPHIN)  IV  2 g Intravenous Q24H  . lactulose  10 g Oral BID  . pantoprazole (PROTONIX) IV  40 mg Intravenous QHS  .  propranolol  10 mg Oral BID  . rifaximin  550 mg Oral BID   PRN Meds  Results for orders placed or performed during the hospital encounter of 03/18/15 (from the past 48 hour(s))  Ammonia     Status: Abnormal   Collection Time: 03/18/15  5:00 PM  Result Value Ref Range   Ammonia 39 (H) 9 - 35 umol/L  Ethanol     Status: None   Collection Time: 03/18/15  5:00 PM  Result Value Ref Range   Alcohol, Ethyl (B) <5 <5 mg/dL    Comment:        LOWEST DETECTABLE LIMIT FOR SERUM ALCOHOL IS 5 mg/dL FOR MEDICAL PURPOSES ONLY   CBC     Status: Abnormal   Collection Time: 03/18/15  5:21 PM  Result Value Ref Range   WBC 7.3 4.0 - 10.5 K/uL   RBC 3.68 (L) 4.22 - 5.81 MIL/uL   Hemoglobin 13.1 13.0 - 17.0 g/dL   HCT 38.3 (L) 39.0 - 52.0 %   MCV 104.1 (H) 78.0 - 100.0 fL   MCH 35.6 (H) 26.0 - 34.0 pg   MCHC 34.2 30.0 - 36.0 g/dL   RDW 15.9 (H) 11.5 - 15.5 %   Platelets 63 (L) 150 - 400 K/uL    Comment: REPEATED TO VERIFY PLATELET COUNT CONFIRMED BY SMEAR   Comprehensive metabolic panel     Status: Abnormal   Collection Time: 03/18/15  5:21 PM  Result Value Ref Range   Sodium 138 135 - 145 mmol/L   Potassium 4.6 3.5 - 5.1 mmol/L   Chloride 102 101 - 111 mmol/L   CO2 23 22 - 32 mmol/L   Glucose, Bld 97 65 - 99 mg/dL   BUN 14 6 - 20 mg/dL   Creatinine, Ser 0.92 0.61 - 1.24 mg/dL   Calcium 9.1 8.9 - 10.3 mg/dL   Total Protein 6.7 6.5 - 8.1 g/dL   Albumin 2.7 (L) 3.5 - 5.0 g/dL   AST 158 (H) 15 - 41 U/L   ALT 60 17 - 63 U/L   Alkaline Phosphatase 105 38 - 126 U/L   Total Bilirubin 13.6 (H) 0.3 - 1.2 mg/dL   GFR calc non Af Amer >60 >60 mL/min   GFR calc Af Amer >60 >60 mL/min    Comment: (NOTE) The eGFR has been calculated using the CKD EPI equation. This calculation has not been validated in all clinical situations. eGFR's persistently <60 mL/min signify possible Chronic Kidney Disease.    Anion gap 13 5 - 15  I-Stat Troponin, ED (not at [facility])     Status: None   Collection Time:  03/18/15  5:22 PM  Result Value Ref Range   Troponin i, poc 0.06 0.00 - 0.08 ng/mL   Comment 3            Comment:  Due to the release kinetics of cTnI, a negative result within the first hours of the onset of symptoms does not rule out myocardial infarction with certainty. If myocardial infarction is still suspected, repeat the test at appropriate intervals.   Urinalysis, Routine w reflex microscopic (not at [facility])     Status: Abnormal   Collection Time: 03/18/15  7:18 PM  Result Value Ref Range   Color, Urine ORANGE (A) YELLOW    Comment: BIOCHEMICALS MAY BE AFFECTED BY COLOR   APPearance TURBID (A) CLEAR   Specific Gravity, Urine 1.029 1.005 - 1.030   pH 5.5 5.0 - 8.0   Glucose, UA NEGATIVE NEGATIVE mg/dL   Hgb urine dipstick MODERATE (A) NEGATIVE   Bilirubin Urine LARGE (A) NEGATIVE   Ketones, ur 15 (A) NEGATIVE mg/dL   Protein, ur NEGATIVE NEGATIVE mg/dL   Nitrite POSITIVE (A) NEGATIVE   Leukocytes, UA SMALL (A) NEGATIVE  Urine rapid drug screen (hosp performed)     Status: Abnormal   Collection Time: 03/18/15  7:18 PM  Result Value Ref Range   Opiates NONE DETECTED NONE DETECTED   Cocaine NONE DETECTED NONE DETECTED   Benzodiazepines NONE DETECTED NONE DETECTED   Amphetamines NONE DETECTED NONE DETECTED   Tetrahydrocannabinol POSITIVE (A) NONE DETECTED   Barbiturates NONE DETECTED NONE DETECTED    Comment:        DRUG SCREEN FOR MEDICAL PURPOSES ONLY.  IF CONFIRMATION IS NEEDED FOR ANY PURPOSE, NOTIFY LAB WITHIN 5 DAYS.        LOWEST DETECTABLE LIMITS FOR URINE DRUG SCREEN Drug Class       Cutoff (ng/mL) Amphetamine      1000 Barbiturate      200 Benzodiazepine   299 Tricyclics       242 Opiates          300 Cocaine          300 THC              50   Urine microscopic-add on     Status: Abnormal   Collection Time: 03/18/15  7:18 PM  Result Value Ref Range   Squamous Epithelial / LPF 0-5 (A) NONE SEEN   WBC, UA 0-5 0 - 5 WBC/hpf   RBC / HPF 0-5 0 - 5  RBC/hpf   Bacteria, UA RARE (A) NONE SEEN   Casts HYALINE CASTS (A) NEGATIVE   Urine-Other MUCOUS PRESENT   Urine culture     Status: None (Preliminary result)   Collection Time: 03/18/15  7:18 PM  Result Value Ref Range   Specimen Description URINE, CATHETERIZED    Special Requests NONE    Culture TOO YOUNG TO READ    Report Status PENDING   CK     Status: None   Collection Time: 03/18/15 10:14 PM  Result Value Ref Range   Total CK 253 49 - 397 U/L  Ammonia     Status: Abnormal   Collection Time: 03/19/15  6:16 AM  Result Value Ref Range   Ammonia 40 (H) 9 - 35 umol/L  Comprehensive metabolic panel     Status: Abnormal   Collection Time: 03/19/15  6:16 AM  Result Value Ref Range   Sodium 136 135 - 145 mmol/L   Potassium 4.3 3.5 - 5.1 mmol/L   Chloride 101 101 - 111 mmol/L   CO2 25 22 - 32 mmol/L   Glucose, Bld 94 65 - 99 mg/dL   BUN 16 6 - 20 mg/dL  Creatinine, Ser 0.80 0.61 - 1.24 mg/dL   Calcium 8.8 (L) 8.9 - 10.3 mg/dL   Total Protein 6.0 (L) 6.5 - 8.1 g/dL   Albumin 2.4 (L) 3.5 - 5.0 g/dL   AST 177 (H) 15 - 41 U/L   ALT 64 (H) 17 - 63 U/L   Alkaline Phosphatase 87 38 - 126 U/L   Total Bilirubin 13.8 (H) 0.3 - 1.2 mg/dL   GFR calc non Af Amer >60 >60 mL/min   GFR calc Af Amer >60 >60 mL/min    Comment: (NOTE) The eGFR has been calculated using the CKD EPI equation. This calculation has not been validated in all clinical situations. eGFR's persistently <60 mL/min signify possible Chronic Kidney Disease.    Anion gap 10 5 - 15  CBC     Status: Abnormal   Collection Time: 03/19/15  6:16 AM  Result Value Ref Range   WBC 6.2 4.0 - 10.5 K/uL   RBC 3.47 (L) 4.22 - 5.81 MIL/uL   Hemoglobin 12.3 (L) 13.0 - 17.0 g/dL   HCT 35.8 (L) 39.0 - 52.0 %   MCV 103.2 (H) 78.0 - 100.0 fL   MCH 35.4 (H) 26.0 - 34.0 pg   MCHC 34.4 30.0 - 36.0 g/dL   RDW 16.1 (H) 11.5 - 15.5 %   Platelets 52 (L) 150 - 400 K/uL    Comment: CONSISTENT WITH PREVIOUS RESULT  Protime-INR     Status:  Abnormal   Collection Time: 03/19/15  6:16 AM  Result Value Ref Range   Prothrombin Time 31.3 (H) 11.6 - 15.2 seconds   INR 3.09 (H) 0.00 - 1.49  Bilirubin, fractionated(tot/dir/indir)     Status: Abnormal   Collection Time: 03/19/15 10:42 AM  Result Value Ref Range   Total Bilirubin 13.2 (H) 0.3 - 1.2 mg/dL   Bilirubin, Direct 6.0 (H) 0.1 - 0.5 mg/dL   Indirect Bilirubin 7.2 (H) 0.3 - 0.9 mg/dL    Dg Chest 2 View  03/18/2015  CLINICAL DATA:  Altered mental status. EXAM: CHEST  2 VIEW COMPARISON:  11/05/2014 FINDINGS: Heart, mediastinum hila are unremarkable. Lung volumes are low. Lungs are clear. No pleural effusion or pneumothorax. Bony thorax is intact. IMPRESSION: No active cardiopulmonary disease. Electronically Signed   By: Lajean Manes M.D.   On: 03/18/2015 17:56   Ct Head Wo Contrast  03/18/2015  CLINICAL DATA:  55 year old male with acute altered mental status. EXAM: CT HEAD WITHOUT CONTRAST TECHNIQUE: Contiguous axial images were obtained from the base of the skull through the vertex without intravenous contrast. COMPARISON:  10/31/2013 and prior CTs FINDINGS: Atrophy and chronic small-vessel white matter ischemic changes are again identified. A low-density 2 mm right frontal subdural collection is compatible with residual changes from prior subdural hematoma. There is no evidence of acute infarction, hemorrhage, new extra-axial collection, midline shift, hydrocephalus or mass lesion. No acute bony abnormalities are identified. Right frontal craniotomy changes are present. IMPRESSION: No evidence of acute intracranial abnormality. Atrophy, chronic small-vessel white matter ischemic changes and 2 mm chronic right frontal subdural collections/changes. Electronically Signed   By: Margarette Canada M.D.   On: 03/18/2015 20:36   Ct Abdomen Pelvis W Contrast  03/19/2015  CLINICAL DATA:  Elevated LFTs. Biliary and hepatic encephalopathy. Initial encounter. EXAM: CT ABDOMEN AND PELVIS WITH CONTRAST  TECHNIQUE: Multidetector CT imaging of the abdomen and pelvis was performed using the standard protocol following bolus administration of intravenous contrast. CONTRAST:  138m OMNIPAQUE IOHEXOL 300 MG/ML  SOLN COMPARISON:  Right upper quadrant ultrasound performed 09/25/2012, and CT of the abdomen and pelvis performed 09/18/2012 FINDINGS: Trace bilateral pleural effusions are noted, with associated atelectasis. Minimal bilateral gynecomastia is noted. Large volume ascites is seen within the abdomen and pelvis. There is a diffusely nodular contour of the liver, compatible with hepatic cirrhosis. Underlying mass cannot be excluded, but is not well assessed on this study. The spleen is enlarged, measuring 17.6 cm in length. Scattered stones are seen within the gallbladder. The gallbladder is not well assessed given surrounding ascites. The pancreas and adrenal glands are grossly unremarkable. 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 The kidneys are unremarkable in appearance. There is no evidence of hydronephrosis. No renal or ureteral stones are seen. No perinephric stranding is appreciated. The small bowel is unremarkable in appearance. The stomach is within normal limits. No acute vascular abnormalities are seen. The patient is status post appendectomy. Mild mucosal thickening is suggested along the entirety of the colon, though this may simply reflect surrounding ascites. Would correlate for any evidence of enteropathy. The bladder is mildly distended and grossly unremarkable. The prostate remains normal in size. No inguinal lymphadenopathy is seen. Diffuse soft tissue edema is noted about the abdomen and pelvis, concerning for mild anasarca. No acute osseous abnormalities are identified. IMPRESSION: 1. Large volume ascites within the abdomen and pelvis. 2. Diffusely nodular contour of the liver, compatible with hepatic cirrhosis. Underlying mass cannot be excluded, but is not well assessed on this study. Given the patient's  elevated LFTs, this could be further assessed on dynamic liver protocol MRI or CT, as deemed clinically appropriate. 3. Mild mucosal thickening along the entirety of the colon may simply reflect surrounding ascites. Would correlate for any evidence of enteropathy. 4. Splenomegaly noted. 5. Cholelithiasis. Gallbladder not well assessed given surrounding ascites. 6. Splenic varices noted. 7. Diffuse soft tissue edema about the abdomen and pelvis, concerning for mild anasarca. Electronically Signed   By: Garald Balding M.D.   On: 03/19/2015 05:26               Blood pressure 97/83, pulse 68, temperature 98.5 F (36.9 C), temperature source Oral, resp. rate 20, height _0  (1.803 m), weight 97.9 kg (215 lb 13.3 oz), SpO2 96 %.  Physical exam:   General--white male who is reasonably alert at this time cover with multiple tattoos  ENT--Mark scleral icterus  Neck--no lymphadenopathy  Heart--regular rate and rhythm without murmurs or gallops  Lungs--clear  Abdomen--nontender positive ascites  Psych--somewhat mentally slow but seems oriented Extremities - 2+ pitting edema   Assessment: 1. Hepatic cirrhosis. He has significant liver disease with total bilirubin 13, marked ascites and varices on the CT scan. This is probably due to hepatitis C and continued alcohol ingestion.  Plan: 1. Agree with giving the patient lactulose and in paragraph 7. He seems to party be better. Rifaximin is also on board. He has significant liver disease. We will check hepatitis C viral load to see if he may benefit from hepatitis C treatment although I doubt he'll have the resources for this. Clearly alcohol abstinence is critical.   Inella Kuwahara JR,Corbin Falck L 03/19/2015, 3:17 PM   This note was created using voice recognition software and minor errors may Have occurred unintentionally. Pager: [phone] If no answer or after hours call [phone]

## 2015-03-19 NOTE — Progress Notes (Signed)
PROGRESS NOTE  Gary [name]:[id number] DOB: 05-27-1960 DOA: 03/18/2015 PCP: No primary care provider on file.  Assessment/Plan: Metabolic encephalopathy -Admit to MedSurg -Patient will possible UTI vs SBP- rocephin 2 grams IV daily Ammonia only slightly elevated -Patient has not been taking his medication, will resume medications  Transaminitis -GI consult- Dr. Randa Evens -CT abdomen and pelvis -Hepatitis panel ordered.   Cirrhosis (HCC) -still abusing alcohol per H&P  HCV (hepatitis C virus) -Monitor   Thrombocytopenia  -Chronic, unchanged. Monitor  -No Lovenox for DVT prophylaxis   Chronic subdural  Code Status: full Family Communication: patient Disposition Plan:    Consultants:    Procedures:      HPI/Subjective: Will only state his name Nurse reports he drank his contrast fine earlier  Objective: Filed Vitals:   03/19/15 0549 03/19/15 0957  BP: 117/66 121/76  Pulse: 93 89  Temp: 98.4 F (36.9 C)   Resp: 18     Intake/Output Summary (Last 24 hours) at 03/19/15 1223 Last data filed at 03/19/15 1025  Gross per 24 hour  Intake      0 ml  Output    125 ml  Net   -125 ml   Filed Weights   03/19/15 0549  Weight: 97.9 kg (215 lb 13.3 oz)    Exam:   General:  Sleeping but will awaken and state name  Cardiovascular: rrr  Respiratory: clear  Abdomen: +BS, distended, + fluid  Musculoskeletal: +edema   Data Reviewed: Basic Metabolic Panel:  Recent Labs Lab 03/18/15 1721 03/19/15 0616  NA 138 136  K 4.6 4.3  CL 102 101  CO2 23 25  GLUCOSE 97 94  BUN 14 16  CREATININE 0.92 0.80  CALCIUM 9.1 8.8*   Liver Function Tests:  Recent Labs Lab 03/18/15 1721 03/19/15 0616  AST 158* 177*  ALT 60 64*  ALKPHOS 105 87  BILITOT 13.6* 13.8*  PROT 6.7 6.0*  ALBUMIN 2.7* 2.4*   No results for input(s): LIPASE, AMYLASE in the last 168 hours.  Recent Labs Lab 03/18/15 1700 03/19/15 0616  AMMONIA 39* 40*   CBC:  Recent  Labs Lab 03/18/15 1721 03/19/15 0616  WBC 7.3 6.2  HGB 13.1 12.3*  HCT 38.3* 35.8*  MCV 104.1* 103.2*  PLT 63* 52*   Cardiac Enzymes:  Recent Labs Lab 03/18/15 2214  CKTOTAL 253   BNP (last 3 results)  Recent Labs  10/20/14 1518 11/05/14 0900  BNP 191.0* 65.5    ProBNP (last 3 results) No results for input(s): PROBNP in the last 8760 hours.  CBG: No results for input(s): GLUCAP in the last 168 hours.  Recent Results (from the past 240 hour(s))  Urine culture     Status: None (Preliminary result)   Collection Time: 03/18/15  7:18 PM  Result Value Ref Range Status   Specimen Description URINE, CATHETERIZED  Final   Special Requests NONE  Final   Culture TOO YOUNG TO READ  Final   Report Status PENDING  Incomplete     Studies: Dg Chest 2 View  03/18/2015  CLINICAL DATA:  Altered mental status. EXAM: CHEST  2 VIEW COMPARISON:  11/05/2014 FINDINGS: Heart, mediastinum hila are unremarkable. Lung volumes are low. Lungs are clear. No pleural effusion or pneumothorax. Bony thorax is intact. IMPRESSION: No active cardiopulmonary disease. Electronically Signed   By: Amie Portland M.D.   On: 03/18/2015 17:56   Ct Head Wo Contrast  03/18/2015  CLINICAL DATA:  55 year old male with acute altered mental  status. EXAM: CT HEAD WITHOUT CONTRAST TECHNIQUE: Contiguous axial images were obtained from the base of the skull through the vertex without intravenous contrast. COMPARISON:  10/31/2013 and prior CTs FINDINGS: Atrophy and chronic small-vessel white matter ischemic changes are again identified. A low-density 2 mm right frontal subdural collection is compatible with residual changes from prior subdural hematoma. There is no evidence of acute infarction, hemorrhage, new extra-axial collection, midline shift, hydrocephalus or mass lesion. No acute bony abnormalities are identified. Right frontal craniotomy changes are present. IMPRESSION: No evidence of acute intracranial abnormality.  Atrophy, chronic small-vessel white matter ischemic changes and 2 mm chronic right frontal subdural collections/changes. Electronically Signed   By: Harmon Pier M.D.   On: 03/18/2015 20:36   Ct Abdomen Pelvis W Contrast  03/19/2015  CLINICAL DATA:  Elevated LFTs. Biliary and hepatic encephalopathy. Initial encounter. EXAM: CT ABDOMEN AND PELVIS WITH CONTRAST TECHNIQUE: Multidetector CT imaging of the abdomen and pelvis was performed using the standard protocol following bolus administration of intravenous contrast. CONTRAST:  OMNIPAQUE IOHEXOL 300 MG/ML  SOLN COMPARISON:  Right upper quadrant ultrasound performed 09/25/2012, and CT of the abdomen and pelvis performed 09/18/2012 FINDINGS: Trace bilateral pleural effusions are noted, with associated atelectasis. Minimal bilateral gynecomastia is noted. Large volume ascites is seen within the abdomen and pelvis. There is a diffusely nodular contour of the liver, compatible with hepatic cirrhosis. Underlying mass cannot be excluded, but is not well assessed on this study. The spleen is enlarged, measuring 17.6 cm in length. Scattered stones are seen within the gallbladder. The gallbladder is not well assessed given surrounding ascites. The pancreas and adrenal glands are grossly unremarkable. 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 The kidneys are unremarkable in appearance. There is no evidence of hydronephrosis. No renal or ureteral stones are seen. No perinephric stranding is appreciated. The small bowel is unremarkable in appearance. The stomach is within normal limits. No acute vascular abnormalities are seen. The patient is status post appendectomy. Mild mucosal thickening is suggested along the entirety of the colon, though this may simply reflect surrounding ascites. Would correlate for any evidence of enteropathy. The bladder is mildly distended and grossly unremarkable. The prostate remains normal in size. No inguinal lymphadenopathy is seen. Diffuse soft tissue  edema is noted about the abdomen and pelvis, concerning for mild anasarca. No acute osseous abnormalities are identified. IMPRESSION: 1. Large volume ascites within the abdomen and pelvis. 2. Diffusely nodular contour of the liver, compatible with hepatic cirrhosis. Underlying mass cannot be excluded, but is not well assessed on this study. Given the patient's elevated LFTs, this could be further assessed on dynamic liver protocol MRI or CT, as deemed clinically appropriate. 3. Mild mucosal thickening along the entirety of the colon may simply reflect surrounding ascites. Would correlate for any evidence of enteropathy. 4. Splenomegaly noted. 5. Cholelithiasis. Gallbladder not well assessed given surrounding ascites. 6. Splenic varices noted. 7. Diffuse soft tissue edema about the abdomen and pelvis, concerning for mild anasarca. Electronically Signed   By: Roanna Raider M.D.   On: 03/19/2015 05:26    Scheduled Meds: . cefTRIAXone (ROCEPHIN)  IV  2 g Intravenous Q24H  . lactulose  10 g Oral BID  . pantoprazole (PROTONIX) IV  40 mg Intravenous QHS  . propranolol  10 mg Oral BID  . rifaximin  550 mg Oral BID   Continuous Infusions:  Antibiotics Given (last 72 hours)    Date/Time Action Medication Dose   03/19/15 0957 Given   rifaximin (XIFAXAN) tablet  550 mg 550 mg      Active Problems:   Cirrhosis (HCC)   Seizure (HCC)   HCV (hepatitis C virus)   Metabolic encephalopathy   Transaminitis   SDH (subdural hematoma) (HCC)   Pressure ulcer    Time spent: 25 min    Chrisma Hurlock [facility]  [facility] Pager [phone]. If 7PM-7AM, please contact night-coverage at [email or web address], password [email or web address] 03/19/2015, 12:23 PM  LOS: 1 day

## 2015-03-20 LAB — URINE CULTURE

## 2015-03-20 LAB — HEPATITIS PANEL, ACUTE
HCV Ab: >11 s/co ratio — ABNORMAL HIGH (ref 0.0–0.9)
HEP B S AG: NEGATIVE
Hep A IgM: NEGATIVE
Hep B C IgM: NEGATIVE

## 2015-03-20 MED ORDER — INFLUENZA VAC SPLIT QUAD 0.5 ML IM SUSY
0.5000 mL | PREFILLED_SYRINGE | INTRAMUSCULAR | Status: AC
  Administered 2015-03-21: 0.5 mL via INTRAMUSCULAR
  Filled 2015-03-20: qty 0.5

## 2015-03-20 MED ORDER — PNEUMOCOCCAL VAC POLYVALENT 25 MCG/0.5ML IJ INJ
0.5000 mL | INJECTION | INTRAMUSCULAR | Status: AC
  Administered 2015-03-21: 0.5 mL via INTRAMUSCULAR
  Filled 2015-03-20: qty 0.5

## 2015-03-20 NOTE — Progress Notes (Signed)
[facility]                                                                              Patient Demographics  Gary [name], is a 55 y.o. male, DOB - 10/16/1960, ZOX:[id number]  Admit date - 03/18/2015   Admitting Physician Gery Pray, MD  Outpatient Primary MD for the patient is No primary care provider on file.  LOS - 2   Chief Complaint  Patient presents with  . Altered Mental Status       Brief HPI   Patient is a 55 year old male with known history of chronic subdural hematoma, seizures, cirrhosis, depression was brought to the ED with confusion and not acting right.   Assessment & Plan    Metabolic encephalopathy: Possibly due to UTI versus SBP, hepatic encephalopathy. Urine drug screen positive for THC  - Much more alert and awake today - Continue IV Rocephin, to cover for UTI and SBP prophylaxis - GI consulted, recommended continuing lactulose  Transaminitis in the setting of hepatic cirrhosis, significant liver disease, bilirubin 13, marked ascites, hepatitis C and alcohol ingestion -GI consulted, recommended continuing lactulose, rifaximin  - Follow hepatitis viral load  - Recommended patient to be abstinent from alcohol  - May benefit from diagnostic and therapeutic paracentesis, will follow GI recommendation, currently comfortable   Alcohol abuse  -continue CIWA protocol with Ativan, thiamine, folate  Urinary tract infection - Continue IV Rocephin, urine culture shows multiple species  HCV (hepatitis C virus) -Monitor viral load  Thrombocytopenia  -Chronic, unchanged. Monitor  -No Lovenox for DVT prophylaxis   Chronic subdural hematoma - CT head negative for any acute intracranial abnormality  Code Status full code   Family Communication: Discussed in detail with the patient, all imaging results, lab results explained to the patient   Disposition Plan:  Time Spent in minutes  25 minutes  Procedures  CT head, abdomen and  pelvis Consults   Gastroenterology  DVT Prophylaxis  SCD's  Medications  Scheduled Meds: . cefTRIAXone (ROCEPHIN)  IV  2 g Intravenous Q24H  . folic acid  1 mg Oral Daily  . lactulose  10 g Oral BID  . LORazepam  0-4 mg Intravenous Q6H   Followed by  . [START ON 03/21/2015] LORazepam  0-4 mg Intravenous Q12H  . multivitamin with minerals  1 tablet Oral Daily  . pantoprazole (PROTONIX) IV  40 mg Intravenous QHS  . propranolol  10 mg Oral BID  . rifaximin  550 mg Oral BID  . thiamine  100 mg Oral Daily   Or  . thiamine  100 mg Intravenous Daily   Continuous Infusions:  PRN Meds:.LORazepam **OR** LORazepam   Antibiotics   Anti-infectives    Start     Dose/Rate Route Frequency Ordered Stop   03/19/15 2000  cefTRIAXone (ROCEPHIN) 1 g in dextrose 5 % 50 mL IVPB  Status:  Discontinued     1 g 100 mL/hr over 30 Minutes Intravenous Every 24 hours 03/18/15 2336 03/19/15 0902   03/19/15 2000  cefTRIAXone (ROCEPHIN) 2 g in dextrose 5 % 50 mL IVPB     2 g 100 mL/hr  over 30 Minutes Intravenous Every 24 hours 03/19/15 0902     03/19/15 0015  rifaximin (XIFAXAN) tablet 550 mg     550 mg Oral 2 times daily 03/18/15 2336     03/18/15 2000  cefTRIAXone (ROCEPHIN) 1 g in dextrose 5 % 50 mL IVPB     1 g 100 mL/hr over 30 Minutes Intravenous  Once 03/18/15 1955 03/18/15 2155        Subjective:   Gary [name] was seen and examined today. Much more alert and awake today, still somewhat confused, tremulous . Abdomen is distended.  Objective:   Filed Vitals:   03/19/15 2006 03/19/15 2319 03/20/15 0513 03/20/15 1200  BP: 124/83 119/76 91/52 96/56   Pulse: 65 62 57 54  Temp: 98.5 F (36.9 C) 98.1 F (36.7 C)  97.7 F (36.5 C)  TempSrc: Oral Oral Oral Oral  Resp: Height:      Weight:      SpO2: 100% 100% 95% 96%    Intake/Output Summary (Last 24 hours) at 03/20/15 1400 Last data filed at 03/20/15 1307  Gross per 24 hour  Intake      0 ml  Output    500 ml  Net    -500 ml     Wt Readings from Last 3 Encounters:  03/19/15 97.9 kg (215 lb 13.3 oz)  11/09/14 100.4 kg (221 lb 5.5 oz)  10/22/14 99.4 kg (219 lb 2.2 oz)     Exam  General: Alert and oriented x 2, NAD, multiple tattoos  HEENT:  PERRLA, EOMI, icteric Sclera +, mucous membranes moist.   Neck: Supple, no JVD, no masses  CVS: S1 S2 auscultated, no rubs, murmurs or gallops. Regular rate and rhythm.  Respiratory: Clear to auscultation bilaterally, no wheezing, rales or rhonchi  Abdomen: Soft, nontender, ascites, distended, + bowel sounds  Ext: no cyanosis clubbing, 2+ edema  Neuro: moving all 4 extremities  Skin: No rashes  Psych: somewhat slow to respond but seems oriented 2   Data Review   Micro Results Recent Results (from the past 240 hour(s))  Urine culture     Status: None   Collection Time: 03/18/15  7:18 PM  Result Value Ref Range Status   Specimen Description URINE, CATHETERIZED  Final   Special Requests NONE  Final   Culture MULTIPLE SPECIES PRESENT, SUGGEST RECOLLECTION  Final   Report Status 03/20/2015 FINAL  Final    Radiology Reports Dg Chest 2 View  03/18/2015  CLINICAL DATA:  Altered mental status. EXAM: CHEST  2 VIEW COMPARISON:  11/05/2014 FINDINGS: Heart, mediastinum hila are unremarkable. Lung volumes are low. Lungs are clear. No pleural effusion or pneumothorax. Bony thorax is intact. IMPRESSION: No active cardiopulmonary disease. Electronically Signed   By: Amie Portland M.D.   On: 03/18/2015 17:56   Ct Head Wo Contrast  03/18/2015  CLINICAL DATA:  55 year old male with acute altered mental status. EXAM: CT HEAD WITHOUT CONTRAST TECHNIQUE: Contiguous axial images were obtained from the base of the skull through the vertex without intravenous contrast. COMPARISON:  10/31/2013 and prior CTs FINDINGS: Atrophy and chronic small-vessel white matter ischemic changes are again identified. A low-density 2 mm right frontal subdural collection is compatible  with residual changes from prior subdural hematoma. There is no evidence of acute infarction, hemorrhage, new extra-axial collection, midline shift, hydrocephalus or mass lesion. No acute bony abnormalities are identified. Right frontal craniotomy changes are present. IMPRESSION: No evidence of acute intracranial abnormality. Atrophy,  chronic small-vessel white matter ischemic changes and 2 mm chronic right frontal subdural collections/changes. Electronically Signed   By: Harmon Pier M.D.   On: 03/18/2015 20:36   Ct Abdomen Pelvis W Contrast  03/19/2015  CLINICAL DATA:  Elevated LFTs. Biliary and hepatic encephalopathy. Initial encounter. EXAM: CT ABDOMEN AND PELVIS WITH CONTRAST TECHNIQUE: Multidetector CT imaging of the abdomen and pelvis was performed using the standard protocol following bolus administration of intravenous contrast. CONTRAST:  OMNIPAQUE IOHEXOL 300 MG/ML  SOLN COMPARISON:  Right upper quadrant ultrasound performed 09/25/2012, and CT of the abdomen and pelvis performed 09/18/2012 FINDINGS: Trace bilateral pleural effusions are noted, with associated atelectasis. Minimal bilateral gynecomastia is noted. Large volume ascites is seen within the abdomen and pelvis. There is a diffusely nodular contour of the liver, compatible with hepatic cirrhosis. Underlying mass cannot be excluded, but is not well assessed on this study. The spleen is enlarged, measuring 17.6 cm in length. Scattered stones are seen within the gallbladder. The gallbladder is not well assessed given surrounding ascites. The pancreas and adrenal glands are grossly unremarkable. 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 The kidneys are unremarkable in appearance. There is no evidence of hydronephrosis. No renal or ureteral stones are seen. No perinephric stranding is appreciated. The small bowel is unremarkable in appearance. The stomach is within normal limits. No acute vascular abnormalities are seen. The patient is status post  appendectomy. Mild mucosal thickening is suggested along the entirety of the colon, though this may simply reflect surrounding ascites. Would correlate for any evidence of enteropathy. The bladder is mildly distended and grossly unremarkable. The prostate remains normal in size. No inguinal lymphadenopathy is seen. Diffuse soft tissue edema is noted about the abdomen and pelvis, concerning for mild anasarca. No acute osseous abnormalities are identified. IMPRESSION: 1. Large volume ascites within the abdomen and pelvis. 2. Diffusely nodular contour of the liver, compatible with hepatic cirrhosis. Underlying mass cannot be excluded, but is not well assessed on this study. Given the patient's elevated LFTs, this could be further assessed on dynamic liver protocol MRI or CT, as deemed clinically appropriate. 3. Mild mucosal thickening along the entirety of the colon may simply reflect surrounding ascites. Would correlate for any evidence of enteropathy. 4. Splenomegaly noted. 5. Cholelithiasis. Gallbladder not well assessed given surrounding ascites. 6. Splenic varices noted. 7. Diffuse soft tissue edema about the abdomen and pelvis, concerning for mild anasarca. Electronically Signed   By: Roanna Raider M.D.   On: 03/19/2015 05:26    CBC  Recent Labs Lab 03/18/15 1721 03/19/15 0616  WBC 7.3 6.2  HGB 13.1 12.3*  HCT 38.3* 35.8*  PLT 63* 52*  MCV 104.1* 103.2*  MCH 35.6* 35.4*  MCHC 34.2 34.4  RDW 15.9* 16.1*    Chemistries   Recent Labs Lab 03/18/15 1721 03/19/15 0616 03/19/15 1042  NA 138 136  --   K 4.6 4.3  --   CL 102 101  --   CO2 23 25  --   GLUCOSE 97 94  --   BUN 14 16  --   CREATININE 0.92 0.80  --   CALCIUM 9.1 8.8*  --   AST 158* 177*  --   ALT 60 64*  --   ALKPHOS 105 87  --   BILITOT 13.6* 13.8* 13.2*   ------------------------------------------------------------------------------------------------------------------ estimated creatinine clearance is 124.4 mL/min (by  C-G formula based on Cr of 0.8). ------------------------------------------------------------------------------------------------------------------ No results for input(s): HGBA1C in the last 72 hours. ------------------------------------------------------------------------------------------------------------------ No results  for input(s): CHOL, HDL, LDLCALC, TRIG, CHOLHDL, LDLDIRECT in the last 72 hours. ------------------------------------------------------------------------------------------------------------------ No results for input(s): TSH, T4TOTAL, T3FREE, THYROIDAB in the last 72 hours.  Invalid input(s): FREET3 ------------------------------------------------------------------------------------------------------------------ No results for input(s): VITAMINB12, FOLATE, FERRITIN, TIBC, IRON, RETICCTPCT in the last 72 hours.  Coagulation profile  Recent Labs Lab 03/19/15 0616  INR 3.09*    No results for input(s): DDIMER in the last 72 hours.  Cardiac Enzymes No results for input(s): CKMB, TROPONINI, MYOGLOBIN in the last 168 hours.  Invalid input(s): CK ------------------------------------------------------------------------------------------------------------------ Invalid input(s): POCBNP  No results for input(s): GLUCAP in the last 72 hours.   Gary [name] M.D. [facility] 03/20/2015, 2:00 PM  Pager: [phone] Between 7am to 7pm - call Pager - [phone]  After 7pm go to [email or web address] - password [email or web address]  Call night coverage person covering after 7pm

## 2015-03-20 NOTE — Progress Notes (Signed)
Patient ID: Gary [name], male   DOB: 11/15/1960, 55 y.o.   MRN: [id number] [facility]  Gary [name] 55 y.o. 04/08/1960   Subjective: Somnolent and difficult to arouse. Slow to respond to time and place questions.  Objective: Vital signs in last 24 hours: Filed Vitals:   03/20/15 0513 03/20/15 1200  BP: 91/52 96/56  Pulse: 57 54  Temp:  97.7 F (36.5 C)  Resp: 16 16    Physical Exam: Gen: somnolent, thin HEENT: +scleral icterus CV: RRR Chest: CTA B Abd: distended, mild tenderness diffusely, +BS Ext: +LE edema  Lab Results:  Recent Labs  03/18/15 1721 03/19/15 0616  NA 138 136  K 4.6 4.3  CL 102 101  CO2 23 25  GLUCOSE 97 94  BUN 14 16  CREATININE 0.92 0.80  CALCIUM 9.1 8.8*    Recent Labs  03/18/15 1721 03/19/15 0616 03/19/15 1042  AST 158* 177*  --   ALT 60 64*  --   ALKPHOS 105 87  --   BILITOT 13.6* 13.8* 13.2*  PROT 6.7 6.0*  --   ALBUMIN 2.7* 2.4*  --     Recent Labs  03/18/15 1721 03/19/15 0616  WBC 7.3 6.2  HGB 13.1 12.3*  HCT 38.3* 35.8*  MCV 104.1* 103.2*  PLT 63* 52*    Recent Labs  03/19/15 0616  LABPROT 31.3*  INR 3.09*      Assessment/Plan: Hepatic encephalopathy - continue Lactulose and Rifaximin. Supportive care. Follow LFTs. Will sign off. Call if questions.   Gary [name] C. 03/20/2015, 4:22 PM  Pager [phone]  If no answer or after 5 PM call [phone]

## 2015-03-21 MED ORDER — SODIUM CHLORIDE 0.9 % IV SOLN
INTRAVENOUS | Status: AC
  Administered 2015-03-21: 20:00:00 via INTRAVENOUS

## 2015-03-21 NOTE — Progress Notes (Signed)
[facility]                                                                              Patient Demographics  Gary KnifeRandall [name], is a 55 y.o. male, DOB - 02/10/1960, ZOX:[id number]  Admit date - 03/18/2015   Admitting Physician Gery Prayebby Crosley, MD  Outpatient Primary MD for the patient is No primary care provider on file.  LOS - 3   Chief Complaint  Patient presents with  . Altered Mental Status       Brief HPI   Patient is a 55 year old male with known history of chronic subdural hematoma, seizures, cirrhosis, depression was brought to the ED with confusion and not acting right.   Assessment & Plan    Metabolic encephalopathy: Possibly due to UTI versus SBP, hepatic encephalopathy. Urine drug screen positive for THC  - still somewhat somnolent, Continue IV Rocephin, to cover for UTI and SBP prophylaxis - GI consulted, recommended continuing lactulose and rifaximin  Transaminitis in the setting of hepatic cirrhosis, significant liver disease, bilirubin 13, marked ascites, hepatitis C and alcohol ingestion -GI consulted, recommended continuing lactulose, rifaximin  - Follow hepatitis viral load  - Recommended patient to be abstinent from alcohol  - May benefit from diagnostic and therapeutic paracentesis, will follow GI recommendation, currently comfortable   Alcohol abuse  -continue CIWA protocol with Ativan, thiamine, folate  Urinary tract infection - Continue IV Rocephin, urine culture shows multiple species  HCV (hepatitis C virus) -Monitor viral load  Thrombocytopenia  -Chronic, unchanged. Monitor  -No Lovenox for DVT prophylaxis   Chronic subdural hematoma - CT head negative for any acute intracranial abnormality  Code Status full code   Family Communication:   Disposition Plan:  Time Spent in minutes  15 minutes  Procedures  CT head, abdomen and pelvis Consults   Gastroenterology  DVT Prophylaxis  SCD's  Medications  Scheduled  Meds: . cefTRIAXone (ROCEPHIN)  IV  2 g Intravenous Q24H  . folic acid  1 mg Oral Daily  . lactulose  10 g Oral BID  . LORazepam  0-4 mg Intravenous Q6H   Followed by  . LORazepam  0-4 mg Intravenous Q12H  . multivitamin with minerals  1 tablet Oral Daily  . pantoprazole (PROTONIX) IV  40 mg Intravenous QHS  . rifaximin  550 mg Oral BID  . thiamine  100 mg Oral Daily   Or  . thiamine  100 mg Intravenous Daily   Continuous Infusions:  PRN Meds:.LORazepam **OR** LORazepam   Antibiotics   Anti-infectives    Start     Dose/Rate Route Frequency Ordered Stop   03/19/15 2000  cefTRIAXone (ROCEPHIN) 1 g in dextrose 5 % 50 mL IVPB  Status:  Discontinued     1 g 100 mL/hr over 30 Minutes Intravenous Every 24 hours 03/18/15 2336 03/19/15 0902   03/19/15 2000  cefTRIAXone (ROCEPHIN) 2 g in dextrose 5 % 50 mL IVPB     2 g 100 mL/hr over 30 Minutes Intravenous Every 24 hours 03/19/15 0902     03/19/15 0015  rifaximin (XIFAXAN) tablet 550 mg     550 mg Oral  2 times daily 03/18/15 2336     03/18/15 2000  cefTRIAXone (ROCEPHIN) 1 g in dextrose 5 % 50 mL IVPB     1 g 100 mL/hr over 30 Minutes Intravenous  Once 03/18/15 1955 03/18/15 2155        Subjective:   Gary [name] was seen and examined today. Still somewhat somnolent, arousable, however slow to respond to any question. Abdomen is distended.  Objective:   Filed Vitals:   03/20/15 2308 03/21/15 0530 03/21/15 0955 03/21/15 1333  BP: 101/57 100/59 100/53 88/53  Pulse: 55 54 54 53  Temp: 98 F (36.7 C) 97.9 F (36.6 C)  98 F (36.7 C)  TempSrc: Oral Oral  Oral  Resp: Height:      Weight:      SpO2: 100% 98%  96%    Intake/Output Summary (Last 24 hours) at 03/21/15 1359 Last data filed at 03/21/15 1332  Gross per 24 hour  Intake      0 ml  Output      0 ml  Net      0 ml     Wt Readings from Last 3 Encounters:  03/19/15 97.9 kg (215 lb 13.3 oz)  11/09/14 100.4 kg (221 lb 5.5 oz)  10/22/14 99.4 kg  (219 lb 2.2 oz)     Exam  General: Somnolent, arousable, multiple tattoos  HEENT:  PERRLA, EOMI, icteric Sclera +  Neck:   CVS: S1 S2 clear, RRR  Respiratory: CTA anteriorly  Abdomen: Soft, nontender, ascites, distended, + bowel sounds  Ext: no cyanosis clubbing, 2+ edema  Neuro: did not cooperate  Skin: No rashes  Psych: somewhat slow to respond   Data Review   Micro Results Recent Results (from the past 240 hour(s))  Urine culture     Status: None   Collection Time: 03/18/15  7:18 PM  Result Value Ref Range Status   Specimen Description URINE, CATHETERIZED  Final   Special Requests NONE  Final   Culture MULTIPLE SPECIES PRESENT, SUGGEST RECOLLECTION  Final   Report Status 03/20/2015 FINAL  Final    Radiology Reports Dg Chest 2 View  03/18/2015  CLINICAL DATA:  Altered mental status. EXAM: CHEST  2 VIEW COMPARISON:  11/05/2014 FINDINGS: Heart, mediastinum hila are unremarkable. Lung volumes are low. Lungs are clear. No pleural effusion or pneumothorax. Bony thorax is intact. IMPRESSION: No active cardiopulmonary disease. Electronically Signed   By: Amie Portland M.D.   On: 03/18/2015 17:56   Ct Head Wo Contrast  03/18/2015  CLINICAL DATA:  55 year old male with acute altered mental status. EXAM: CT HEAD WITHOUT CONTRAST TECHNIQUE: Contiguous axial images were obtained from the base of the skull through the vertex without intravenous contrast. COMPARISON:  10/31/2013 and prior CTs FINDINGS: Atrophy and chronic small-vessel white matter ischemic changes are again identified. A low-density 2 mm right frontal subdural collection is compatible with residual changes from prior subdural hematoma. There is no evidence of acute infarction, hemorrhage, new extra-axial collection, midline shift, hydrocephalus or mass lesion. No acute bony abnormalities are identified. Right frontal craniotomy changes are present. IMPRESSION: No evidence of acute intracranial abnormality. Atrophy,  chronic small-vessel white matter ischemic changes and 2 mm chronic right frontal subdural collections/changes. Electronically Signed   By: Harmon Pier M.D.   On: 03/18/2015 20:36   Ct Abdomen Pelvis W Contrast  03/19/2015  CLINICAL DATA:  Elevated LFTs. Biliary and hepatic encephalopathy. Initial encounter. EXAM: CT ABDOMEN AND PELVIS WITH  CONTRAST TECHNIQUE: Multidetector CT imaging of the abdomen and pelvis was performed using the standard protocol following bolus administration of intravenous contrast. CONTRAST:  OMNIPAQUE IOHEXOL 300 MG/ML  SOLN COMPARISON:  Right upper quadrant ultrasound performed 09/25/2012, and CT of the abdomen and pelvis performed 09/18/2012 FINDINGS: Trace bilateral pleural effusions are noted, with associated atelectasis. Minimal bilateral gynecomastia is noted. Large volume ascites is seen within the abdomen and pelvis. There is a diffusely nodular contour of the liver, compatible with hepatic cirrhosis. Underlying mass cannot be excluded, but is not well assessed on this study. The spleen is enlarged, measuring 17.6 cm in length. Scattered stones are seen within the gallbladder. The gallbladder is not well assessed given surrounding ascites. The pancreas and adrenal glands are grossly unremarkable. 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 The kidneys are unremarkable in appearance. There is no evidence of hydronephrosis. No renal or ureteral stones are seen. No perinephric stranding is appreciated. The small bowel is unremarkable in appearance. The stomach is within normal limits. No acute vascular abnormalities are seen. The patient is status post appendectomy. Mild mucosal thickening is suggested along the entirety of the colon, though this may simply reflect surrounding ascites. Would correlate for any evidence of enteropathy. The bladder is mildly distended and grossly unremarkable. The prostate remains normal in size. No inguinal lymphadenopathy is seen. Diffuse soft tissue edema is  noted about the abdomen and pelvis, concerning for mild anasarca. No acute osseous abnormalities are identified. IMPRESSION: 1. Large volume ascites within the abdomen and pelvis. 2. Diffusely nodular contour of the liver, compatible with hepatic cirrhosis. Underlying mass cannot be excluded, but is not well assessed on this study. Given the patient's elevated LFTs, this could be further assessed on dynamic liver protocol MRI or CT, as deemed clinically appropriate. 3. Mild mucosal thickening along the entirety of the colon may simply reflect surrounding ascites. Would correlate for any evidence of enteropathy. 4. Splenomegaly noted. 5. Cholelithiasis. Gallbladder not well assessed given surrounding ascites. 6. Splenic varices noted. 7. Diffuse soft tissue edema about the abdomen and pelvis, concerning for mild anasarca. Electronically Signed   By: Roanna Raider M.D.   On: 03/19/2015 05:26    CBC  Recent Labs Lab 03/18/15 1721 03/19/15 0616  WBC 7.3 6.2  HGB 13.1 12.3*  HCT 38.3* 35.8*  PLT 63* 52*  MCV 104.1* 103.2*  MCH 35.6* 35.4*  MCHC 34.2 34.4  RDW 15.9* 16.1*    Chemistries   Recent Labs Lab 03/18/15 1721 03/19/15 0616 03/19/15 1042  NA 138 136  --   K 4.6 4.3  --   CL 102 101  --   CO2 23 25  --   GLUCOSE 97 94  --   BUN 14 16  --   CREATININE 0.92 0.80  --   CALCIUM 9.1 8.8*  --   AST 158* 177*  --   ALT 60 64*  --   ALKPHOS 105 87  --   BILITOT 13.6* 13.8* 13.2*   ------------------------------------------------------------------------------------------------------------------ estimated creatinine clearance is 124.4 mL/min (by C-G formula based on Cr of 0.8). ------------------------------------------------------------------------------------------------------------------ No results for input(s): HGBA1C in the last 72 hours. ------------------------------------------------------------------------------------------------------------------ No results for input(s):  CHOL, HDL, LDLCALC, TRIG, CHOLHDL, LDLDIRECT in the last 72 hours. ------------------------------------------------------------------------------------------------------------------ No results for input(s): TSH, T4TOTAL, T3FREE, THYROIDAB in the last 72 hours.  Invalid input(s): FREET3 ------------------------------------------------------------------------------------------------------------------ No results for input(s): VITAMINB12, FOLATE, FERRITIN, TIBC, IRON, RETICCTPCT in the last 72 hours.  Coagulation profile  Recent Labs Lab 03/19/15  6213  INR 3.09*    No results for input(s): DDIMER in the last 72 hours.  Cardiac Enzymes No results for input(s): CKMB, TROPONINI, MYOGLOBIN in the last 168 hours.  Invalid input(s): CK ------------------------------------------------------------------------------------------------------------------ Invalid input(s): POCBNP  No results for input(s): GLUCAP in the last 72 hours.   Mihail Prettyman M.D. [facility] 03/21/2015, 1:59 PM  Pager: [phone] Between 7am to 7pm - call Pager - [phone]  After 7pm go to [email or web address] - password [email or web address]  Call night coverage person covering after 7pm

## 2015-03-21 NOTE — Progress Notes (Signed)
Utilization Review Completed.Harshika Mago T3/05/2015  

## 2015-03-22 ENCOUNTER — Inpatient Hospital Stay (HOSPITAL_COMMUNITY): Payer: Medicaid Other

## 2015-03-22 LAB — COMPREHENSIVE METABOLIC PANEL
ALBUMIN: 1.8 g/dL — AB (ref 3.5–5.0)
ALK PHOS: 72 U/L (ref 38–126)
ALT: 53 U/L (ref 17–63)
AST: 110 U/L — ABNORMAL HIGH (ref 15–41)
Anion gap: 8 (ref 5–15)
BUN: 17 mg/dL (ref 6–20)
CALCIUM: 8.6 mg/dL — AB (ref 8.9–10.3)
CO2: 25 mmol/L (ref 22–32)
CREATININE: 0.92 mg/dL (ref 0.61–1.24)
Chloride: 107 mmol/L (ref 101–111)
GFR calc Af Amer: >60 mL/min (ref >60)
GFR calc non Af Amer: >60 mL/min (ref >60)
GLUCOSE: 82 mg/dL (ref 65–99)
Potassium: 4.1 mmol/L (ref 3.5–5.1)
SODIUM: 140 mmol/L (ref 135–145)
Total Bilirubin: 13.2 mg/dL — ABNORMAL HIGH (ref 0.3–1.2)
Total Protein: 4.8 g/dL — ABNORMAL LOW (ref 6.5–8.1)

## 2015-03-22 LAB — LACTATE DEHYDROGENASE, PLEURAL OR PERITONEAL FLUID: LD FL: 32 U/L — AB (ref 3–23)

## 2015-03-22 LAB — ALBUMIN, FLUID (OTHER): Albumin, Fluid: <1 g/dL

## 2015-03-22 LAB — GRAM STAIN

## 2015-03-22 LAB — CBC
HCT: 32 % — ABNORMAL LOW (ref 39.0–52.0)
Hemoglobin: 10.7 g/dL — ABNORMAL LOW (ref 13.0–17.0)
MCH: 34.1 pg — AB (ref 26.0–34.0)
MCHC: 33.4 g/dL (ref 30.0–36.0)
MCV: 101.9 fL — ABNORMAL HIGH (ref 78.0–100.0)
Platelets: 47 10*3/uL — ABNORMAL LOW (ref 150–400)
RBC: 3.14 MIL/uL — ABNORMAL LOW (ref 4.22–5.81)
RDW: 15.5 % (ref 11.5–15.5)
WBC: 4 10*3/uL (ref 4.0–10.5)

## 2015-03-22 LAB — BODY FLUID CELL COUNT WITH DIFFERENTIAL
EOS FL: 0 %
LYMPHS FL: 21 %
Monocyte-Macrophage-Serous Fluid: 74 % (ref 50–90)
Neutrophil Count, Fluid: 5 % (ref 0–25)
OTHER CELLS FL: 0 %
Total Nucleated Cell Count, Fluid: 46 cu mm (ref 0–1000)

## 2015-03-22 LAB — PROTEIN, BODY FLUID

## 2015-03-22 LAB — LACTATE DEHYDROGENASE: LDH: 183 U/L (ref 98–192)

## 2015-03-22 MED ORDER — LIDOCAINE HCL (PF) 1 % IJ SOLN
INTRAMUSCULAR | Status: AC
Start: 2015-03-22 — End: 2015-03-23
  Filled 2015-03-22: qty 10

## 2015-03-22 NOTE — Evaluation (Signed)
Clinical/Bedside Swallow Evaluation Patient Details  Name: Gary [name] MRN: [id number] Date of Birth: 04/04/1960  Today's Date: 03/22/2015 Time: SLP Start Time (ACUTE ONLY): 1635 SLP Stop Time (ACUTE ONLY): 1650 SLP Time Calculation (min) (ACUTE ONLY): 15 min  Past Medical History:  Past Medical History  Diagnosis Date  . Cirrhosis (HCC) dx prior to 2013    due to ETOH and Hep C.   . SDH (subdural hematoma) (HCC) 10/2013  . Seizure (HCC) 2015  . Pneumonia "several times"  . History of blood transfusion     got FFP and platelets 10/2013.   Marland Kitchen. Hepatitis C dx prior to 2013    genotype 2.   . Depression with anxiety   . Chronic anemia     [name]/notes 10/20/2014  . Kidney stones   . AKI (acute kidney injury) (HCC) 03/2011, 10/2014    bounces back to normal GFR post AKI.   . GI bleed 03/2011    hematemesis  . Portal hypertensive gastropathy 03/2011    [place]GD: portal htn, no varices[place]   . Thrombocytopenia (HCC) 03/2011  . Ascites 2014  . Coagulopathy (HCC) 03/2011  . Cellulitis 10/2014    Right LE  . Venous stasis dermatitis of both lower extremities 10/2014  . Hepatic encephalopathy (HCC) 10/2013    treatment with Rifaximin, lactulose.    Past Surgical History:  Past Surgical History  Procedure Laterality Date  . Craniotomy Right 10/21/2013    Procedure: CRANIOTOMY HEMATOMA EVACUATION SUBDURAL;  Surgeon: Maeola HarmanJoseph Stern, MD;  Location: [facility];  Service: Neurosurgery;  Laterality: Right;  . Tonsillectomy    . Appendectomy    . Abdominal hernia repair    . Esophagogastroduodenoscopy  04/10/2011    Dr Renae FicklePaul Oh: diffuse portal gastropathy. No esophageal varices. No bleeding.   HPI:  Pt is a 55 y.o. male admitted 3/2 with metabolic encephalopathy/ possible UTI. PMH seizures, PNA "several times", subdural hematoma, GI bleed, depression. Head CT 3/2 showed no acute abnormality. Chest CT negative. Bedside swallow eval ordered to assess swallow function/ determine  safest diet.    Assessment / Plan / Recommendation Clinical Impression  Pt with no overt s/s of aspiration at bedside. At time of evaluation, pt was oriented to self/ location but disoriented to time/ situation. CXR on 3/2 was clear. RN reported that pt consumed 100% of lunch today without difficulty, and pt is able to self-feed. Risk of aspiration appears low at this time; current confusion/ cognitive status increases risk. Recommend continuing regular diet, thin liquids, meds whole with liquid, intermittent supervision to cue pt to take small bites/ sips and ensure pt is in upright position for meals. SLP will sign off at this time. Please re-consult if needs arise.    Aspiration Risk  Mild aspiration risk    Diet Recommendation Regular;Thin liquid   Liquid Administration via: Cup;Straw Medication Administration: Whole meds with liquid Supervision: Patient able to self feed;Intermittent supervision to cue for compensatory strategies Compensations: Slow rate;Small sips/bites Postural Changes: Seated upright at 90 degrees    Other  Recommendations Oral Care Recommendations: Oral care BID   Follow up Recommendations  None    Frequency and Duration            Prognosis        Swallow Study   General HPI: Pt is a 55 y.o. male admitted 3/2 with metabolic encephalopathy/ possible UTI. PMH seizures, PNA "several times", subdural hematoma, GI bleed, depression. Head CT 3/2  showed no acute abnormality. Chest CT negative. Bedside swallow eval ordered to assess swallow function/ determine safest diet.  Type of Study: Bedside Swallow Evaluation Diet Prior to this Study: Regular;Thin liquids Temperature Spikes Noted: No Respiratory Status: Room air History of Recent Intubation: No Behavior/Cognition: Alert;Cooperative Oral Cavity Assessment: Within Functional Limits Oral Care Completed by SLP: No Oral Cavity - Dentition: Edentulous Vision: Functional for self-feeding Self-Feeding  Abilities: Able to feed self Patient Positioning: Upright in bed Baseline Vocal Quality: Normal Volitional Cough: Strong Volitional Swallow: Able to elicit    Oral/Motor/Sensory Function Overall Oral Motor/Sensory Function: Within functional limits   Ice Chips Ice chips: Not tested   Thin Liquid Thin Liquid: Within functional limits Presentation: Cup;Straw    Nectar Thick Nectar Thick Liquid: Not tested   Honey Thick Honey Thick Liquid: Not tested   Puree Puree: Within functional limits Presentation: Self Fed;Spoon   Solid   GO   Solid: Within functional limits Presentation: Self Krystal Clark, Amy K, MA, CCC-SLP 03/22/2015,4:57 PM [phone]

## 2015-03-22 NOTE — Progress Notes (Signed)
[facility]                                                                              Patient Demographics  Gary [name], is a 55 y.o. male, DOB - 02-02-1960, WUJ:[id number]  Admit date - 03/18/2015   Admitting Physician Gery Pray, MD  Outpatient Primary MD for the patient is No primary care provider on file.  LOS - 4   Chief Complaint  Patient presents with  . Altered Mental Status       Brief HPI   Patient is a 55 year old male with known history of chronic subdural hematoma, seizures, cirrhosis, depression was brought to the ED with confusion and not acting right.   Assessment & Plan    Metabolic encephalopathy: Possibly due to UTI versus SBP, hepatic encephalopathy. Urine drug screen positive for THC  - Much more alert and oriented 4. - Continue lactulose, rifaximin - Taper Ativan to as needed per CIWA, recommended alcohol cessation to the patient - PT evaluation recommended skilled nursing facility-> SW consult  Transaminitis in the setting of hepatic cirrhosis, significant liver disease, bilirubin 13, marked ascites, hepatitis C and alcohol ingestion - GI consulted, recommended continuing lactulose, rifaximin  - Follow hepatitis viral load  - Recommended patient to be abstinent from alcohol  - Obtain ultrasound-guided paracentesis   Alcohol abuse  -continue CIWA protocol with Ativan, thiamine, folate  Urinary tract infection - Continue IV Rocephin, urine culture shows multiple species  HCV (hepatitis C virus) -Monitor viral load  Thrombocytopenia  -Chronic, unchanged. Monitor  -No Lovenox for DVT prophylaxis   Chronic subdural hematoma - CT head negative for any acute intracranial abnormality  Code Status: full code   Family Communication: No family member at the bedside  Disposition Plan: Hopefully DC in a.m. pending when bed available in 24-48hrs, after the paracentesis  Time Spent in minutes  25 minutes  Procedures    CT head, abdomen and pelvis  Consults   Gastroenterology  DVT Prophylaxis  SCD's  Medications  Scheduled Meds: . cefTRIAXone (ROCEPHIN)  IV  2 g Intravenous Q24H  . folic acid  1 mg Oral Daily  . lactulose  10 g Oral BID  . LORazepam  0-4 mg Intravenous Q12H  . multivitamin with minerals  1 tablet Oral Daily  . pantoprazole (PROTONIX) IV  40 mg Intravenous QHS  . rifaximin  550 mg Oral BID  . thiamine  100 mg Oral Daily   Or  . thiamine  100 mg Intravenous Daily   Continuous Infusions:  PRN Meds:.LORazepam **OR** LORazepam   Antibiotics   Anti-infectives    Start     Dose/Rate Route Frequency Ordered Stop   03/19/15 2000  cefTRIAXone (ROCEPHIN) 1 g in dextrose 5 % 50 mL IVPB  Status:  Discontinued     1 g 100 mL/hr over 30 Minutes Intravenous Every 24 hours 03/18/15 2336 03/19/15 0902   03/19/15 2000  cefTRIAXone (ROCEPHIN) 2 g in dextrose 5 % 50 mL IVPB     2 g 100 mL/hr over 30 Minutes Intravenous Every 24 hours 03/19/15 0902     03/19/15 0015  rifaximin (  XIFAXAN) tablet 550 mg     550 mg Oral 2 times daily 03/18/15 2336     03/18/15 2000  cefTRIAXone (ROCEPHIN) 1 g in dextrose 5 % 50 mL IVPB     1 g 100 mL/hr over 30 Minutes Intravenous  Once 03/18/15 1955 03/18/15 2155        Subjective:   Gary [name] was seen and examined today. Alert and oriented today, denies any specific complaints, feels better. No fevers or chills or any abdominal pain, denies any chest pain, shortness of breath, nausea, vomiting.    Objective:   Filed Vitals:   03/21/15 1846 03/22/15 0009 03/22/15 0605 03/22/15 1355  BP: 102/60 94/48 109/61 110/63  Pulse: 58 54 53 59  Temp: 98.1 F (36.7 C) 98.3 F (36.8 C) 98.3 F (36.8 C) 98.2 F (36.8 C)  TempSrc: Oral Oral Oral Oral  Resp: 16 16 16 17   Height:      Weight:      SpO2: 98% 97% 100% 100%    Intake/Output Summary (Last 24 hours) at 03/22/15 1408 Last data filed at 03/22/15 1354  Gross per 24 hour  Intake      0 ml   Output    500 ml  Net   -500 ml     Wt Readings from Last 3 Encounters:  03/19/15 97.9 kg (215 lb 13.3 oz)  11/09/14 100.4 kg (221 lb 5.5 oz)  10/22/14 99.4 kg (219 lb 2.2 oz)     Exam  General: Alert and oriented 3, multiple tattoos  HEENT:  PERRLA, EOMI, icteric Sclera +  Neck:   CVS: S1 S2 clear, RRR  Respiratory: CTA anteriorly  Abdomen: Soft, nontender, ascites, distended, + bowel sounds  Ext: no cyanosis clubbing, 1-2+ edema  Neuro: strength 5/5 in upper and lower extremities  Skin: No rashes  Psych: alert and oriented 3, normal affect   Data Review   Micro Results Recent Results (from the past 240 hour(s))  Urine culture     Status: None   Collection Time: 03/18/15  7:18 PM  Result Value Ref Range Status   Specimen Description URINE, CATHETERIZED  Final   Special Requests NONE  Final   Culture MULTIPLE SPECIES PRESENT, SUGGEST RECOLLECTION  Final   Report Status 03/20/2015 FINAL  Final    Radiology Reports Dg Chest 2 View  03/18/2015  CLINICAL DATA:  Altered mental status. EXAM: CHEST  2 VIEW COMPARISON:  11/05/2014 FINDINGS: Heart, mediastinum hila are unremarkable. Lung volumes are low. Lungs are clear. No pleural effusion or pneumothorax. Bony thorax is intact. IMPRESSION: No active cardiopulmonary disease. Electronically Signed   By: Amie Portlandavid  Ormond M.D.   On: 03/18/2015 17:56   Ct Head Wo Contrast  03/18/2015  CLINICAL DATA:  55 year old male with acute altered mental status. EXAM: CT HEAD WITHOUT CONTRAST TECHNIQUE: Contiguous axial images were obtained from the base of the skull through the vertex without intravenous contrast. COMPARISON:  10/31/2013 and prior CTs FINDINGS: Atrophy and chronic small-vessel white matter ischemic changes are again identified. A low-density 2 mm right frontal subdural collection is compatible with residual changes from prior subdural hematoma. There is no evidence of acute infarction, hemorrhage, new extra-axial  collection, midline shift, hydrocephalus or mass lesion. No acute bony abnormalities are identified. Right frontal craniotomy changes are present. IMPRESSION: No evidence of acute intracranial abnormality. Atrophy, chronic small-vessel white matter ischemic changes and 2 mm chronic right frontal subdural collections/changes. Electronically Signed   By: Henrietta HooverJeffrey  Hu M.D.  On: 03/18/2015 20:36   Ct Abdomen Pelvis W Contrast  03/19/2015  CLINICAL DATA:  Elevated LFTs. Biliary and hepatic encephalopathy. Initial encounter. EXAM: CT ABDOMEN AND PELVIS WITH CONTRAST TECHNIQUE: Multidetector CT imaging of the abdomen and pelvis was performed using the standard protocol following bolus administration of intravenous contrast. CONTRAST:  OMNIPAQUE IOHEXOL 300 MG/ML  SOLN COMPARISON:  Right upper quadrant ultrasound performed 09/25/2012, and CT of the abdomen and pelvis performed 09/18/2012 FINDINGS: Trace bilateral pleural effusions are noted, with associated atelectasis. Minimal bilateral gynecomastia is noted. Large volume ascites is seen within the abdomen and pelvis. There is a diffusely nodular contour of the liver, compatible with hepatic cirrhosis. Underlying mass cannot be excluded, but is not well assessed on this study. The spleen is enlarged, measuring 17.6 cm in length. Scattered stones are seen within the gallbladder. The gallbladder is not well assessed given surrounding ascites. The pancreas and adrenal glands are grossly unremarkable. 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 The kidneys are unremarkable in appearance. There is no evidence of hydronephrosis. No renal or ureteral stones are seen. No perinephric stranding is appreciated. The small bowel is unremarkable in appearance. The stomach is within normal limits. No acute vascular abnormalities are seen. The patient is status post appendectomy. Mild mucosal thickening is suggested along the entirety of the colon, though this may simply reflect surrounding  ascites. Would correlate for any evidence of enteropathy. The bladder is mildly distended and grossly unremarkable. The prostate remains normal in size. No inguinal lymphadenopathy is seen. Diffuse soft tissue edema is noted about the abdomen and pelvis, concerning for mild anasarca. No acute osseous abnormalities are identified. IMPRESSION: 1. Large volume ascites within the abdomen and pelvis. 2. Diffusely nodular contour of the liver, compatible with hepatic cirrhosis. Underlying mass cannot be excluded, but is not well assessed on this study. Given the patient's elevated LFTs, this could be further assessed on dynamic liver protocol MRI or CT, as deemed clinically appropriate. 3. Mild mucosal thickening along the entirety of the colon may simply reflect surrounding ascites. Would correlate for any evidence of enteropathy. 4. Splenomegaly noted. 5. Cholelithiasis. Gallbladder not well assessed given surrounding ascites. 6. Splenic varices noted. 7. Diffuse soft tissue edema about the abdomen and pelvis, concerning for mild anasarca. Electronically Signed   By: Roanna Raider M.D.   On: 03/19/2015 05:26    CBC  Recent Labs Lab 03/18/15 1721 03/19/15 0616 03/22/15 1112  WBC 7.3 6.2 4.0  HGB 13.1 12.3* 10.7*  HCT 38.3* 35.8* 32.0*  PLT 63* 52* 47*  MCV 104.1* 103.2* 101.9*  MCH 35.6* 35.4* 34.1*  MCHC 34.2 34.4 33.4  RDW 15.9* 16.1* 15.5    Chemistries   Recent Labs Lab 03/18/15 1721 03/19/15 0616 03/19/15 1042 03/22/15 1112  NA 138 136  --  140  K 4.6 4.3  --  4.1  CL 102 101  --  107  CO2 23 25  --  25  GLUCOSE 97 94  --  82  BUN 14 16  --  17  CREATININE 0.92 0.80  --  0.92  CALCIUM 9.1 8.8*  --  8.6*  AST 158* 177*  --  110*  ALT 60 64*  --  53  ALKPHOS 105 87  --  72  BILITOT 13.6* 13.8* 13.2* 13.2*   ------------------------------------------------------------------------------------------------------------------ estimated creatinine clearance is 108.2 mL/min (by C-G  formula based on Cr of 0.92). ------------------------------------------------------------------------------------------------------------------ No results for input(s): HGBA1C in the last 72 hours. ------------------------------------------------------------------------------------------------------------------ No results for  input(s): CHOL, HDL, LDLCALC, TRIG, CHOLHDL, LDLDIRECT in the last 72 hours. ------------------------------------------------------------------------------------------------------------------ No results for input(s): TSH, T4TOTAL, T3FREE, THYROIDAB in the last 72 hours.  Invalid input(s): FREET3 ------------------------------------------------------------------------------------------------------------------ No results for input(s): VITAMINB12, FOLATE, FERRITIN, TIBC, IRON, RETICCTPCT in the last 72 hours.  Coagulation profile  Recent Labs Lab 03/19/15 0616  INR 3.09*    No results for input(s): DDIMER in the last 72 hours.  Cardiac Enzymes No results for input(s): CKMB, TROPONINI, MYOGLOBIN in the last 168 hours.  Invalid input(s): CK ------------------------------------------------------------------------------------------------------------------ Invalid input(s): POCBNP  No results for input(s): GLUCAP in the last 72 hours.   Keyanah Kozicki M.D. [facility] 03/22/2015, 2:08 PM  Pager: [phone] Between 7am to 7pm - call Pager - [phone]  After 7pm go to [email or web address] - password [email or web address]  Call night coverage person covering after 7pm

## 2015-03-22 NOTE — Procedures (Signed)
Ultrasound-guided diagnostic and therapeutic paracentesis performed yielding 6 liters of bright yellow colored fluid. No immediate complications.  Gary [name] E 4:20 PM 03/22/2015

## 2015-03-22 NOTE — Evaluation (Signed)
Physical Therapy Evaluation Patient Details Name: Gary [name] MRN: [id number] DOB: [birth year] Today's Date: 03/22/2015   History of Present Illness  Patient is a 55 year old male with known history of chronic subdural hematoma, seizures, cirrhosis, depression was brought to the ED with confusion and not acting right.  Found to have Metabolic encephalopathy: Possibly due to UTI versus SBP, hepatic encephalopathy. Urine drug screen positive for [facility]  Clinical Impression  Patient presents with decreased mobility due to deficits listed in PT problem list.  He will benefit from skilled PT in the acute setting to allow return home following SNF level rehab stay.  Patient currently confused and inconsistent with orientation.  Would not be safe at home alone, but if has capable and consistent caregiver could go home with HHPT.      Follow Up Recommendations SNF;Supervision/Assistance - 24 hour    Equipment Recommendations  3in1 (PT)    Recommendations for Other Services       Precautions / Restrictions Precautions Precautions: Fall      Mobility  Bed Mobility Overal bed mobility: Needs Assistance Bed Mobility: Supine to Sit;Sit to Supine     Supine to sit: Supervision Sit to supine: Min guard   General bed mobility comments: use of rail and HOB elevated to sit, assist for safety, managing IV and condom cath; to supine, assist to reposition legs in bed and for positioning  Transfers Overall transfer level: Needs assistance Equipment used: 4-wheeled walker Transfers: Sit to/from Stand Sit to Stand: Min guard         General transfer comment: little imbalance when coming upright  Ambulation/Gait Ambulation/Gait assistance: Min guard Ambulation Distance (Feet): 220 Feet Assistive device: 4-wheeled walker Gait Pattern/deviations: Step-through pattern;Trunk flexed;Wide base of support     General Gait Details: increased speed, side to side movement wtih rollator which seems a  little unsteady; assist/cues at time to avoid hitting wall rail wtih walker  Stairs            Wheelchair Mobility    Modified Rankin (Stroke Patients Only)       Balance Overall balance assessment: Needs assistance   Sitting balance-Leahy Scale: Good     Standing balance support: Single extremity supported;No upper extremity supported;Bilateral upper extremity supported;During functional activity Standing balance-Leahy Scale: Poor Standing balance comment: assist for balance during urination without UE support due to posterior bias; UE support on sink during transfer to seat of rollator prior to sitting to brush teeth and comb hair                             Pertinent Vitals/Pain Pain Assessment: Faces Faces Pain Scale: Hurts a little bit Pain Location: ankles, knees, hips and elbows due to arthritis Pain Descriptors / Indicators: Aching Pain Intervention(s): Monitored during session;Repositioned    Home Living Family/patient expects to be discharged to:: Private residence Living Arrangements: Alone Available Help at Discharge: Family;Available PRN/intermittently (son and daughter in law live in "the big house" in front of his "shack") Type of Home: House Home Access: Stairs to enter Entrance Stairs-Rails: None Entrance Stairs-Number of Steps: 1 Home Layout: One level Home Equipment: Environmental consultant - 4 wheels;Cane - single point (rollator) Additional Comments: some info gleaned from previous admission as pt unreliable historian initially states lives with his mother, then later stated mother passed away; Pt lives in his own home that he built in his son's backyard and will have assist available prn at d/c.  Prior Function                 Hand Dominance        Extremity/Trunk Assessment   Upper Extremity Assessment: Generalized weakness           Lower Extremity Assessment: Generalized weakness      Cervical / Trunk Assessment: Kyphotic   Communication   Communication: Expressive difficulties (slower, slurred speech)  Cognition Arousal/Alertness: Awake/alert Behavior During Therapy: WFL for tasks assessed/performed Overall Cognitive Status: Impaired/Different from baseline Area of Impairment: Following commands;Problem solving;Safety/judgement;Orientation Orientation Level: Time;Situation   Memory: Decreased short-term memory Following Commands: Follows one step commands inconsistently;Follows one step commands with increased time Safety/Judgement: Decreased awareness of deficits;Decreased awareness of safety   Problem Solving: Slow processing;Requires verbal cues General Comments: Thinks he is here due to leg swelling    General Comments General comments (skin integrity, edema, etc.): patient with yellowish tone to skin and to whites of eyes    Exercises        Assessment/Plan    PT Assessment Patient needs continued PT services  PT Diagnosis Abnormality of gait;Generalized weakness   PT Problem List Decreased strength;Decreased cognition;Decreased activity tolerance;Decreased balance;Decreased mobility;Decreased coordination;Decreased safety awareness;Decreased knowledge of use of DME  PT Treatment Interventions DME instruction;Balance training;Stair training;Gait training;Functional mobility training;Patient/family education;Therapeutic activities;Therapeutic exercise   PT Goals (Current goals can be found in the Care Plan section) Acute Rehab PT Goals Patient Stated Goal: None stated PT Goal Formulation: Patient unable to participate in goal setting Time For Goal Achievement: 03/29/15 Potential to Achieve Goals: Good    Frequency Min 3X/week   Barriers to discharge Decreased caregiver support reports has a friend Marketing executive(Angie) that can stay with him    Co-evaluation               End of Session Equipment Utilized During Treatment: Gait belt Activity Tolerance: Patient tolerated treatment  well Patient left: in bed;with call bell/phone within reach;with bed alarm set           Time: [id number] PT Time Calculation (min) (ACUTE ONLY): 34 min   Charges:   PT Evaluation $PT Eval Moderate Complexity: 1 Procedure PT Treatments $Gait Training: 8-22 mins   PT G CodesElray Mcgregor:        Ansley Mangiapane 03/22/2015, 1:07 PM  Sheran Lawlessyndi Moo Gravley, PT [phone] 03/22/2015

## 2015-03-23 LAB — COMPREHENSIVE METABOLIC PANEL
ALT: 46 U/L (ref 17–63)
AST: 98 U/L — ABNORMAL HIGH (ref 15–41)
Albumin: 1.6 g/dL — ABNORMAL LOW (ref 3.5–5.0)
Alkaline Phosphatase: 83 U/L (ref 38–126)
Anion gap: 6 (ref 5–15)
BUN: 13 mg/dL (ref 6–20)
CO2: 25 mmol/L (ref 22–32)
Calcium: 8 mg/dL — ABNORMAL LOW (ref 8.9–10.3)
Chloride: 105 mmol/L (ref 101–111)
Creatinine, Ser: 1.08 mg/dL (ref 0.61–1.24)
GFR calc Af Amer: >60 mL/min (ref >60)
GFR calc non Af Amer: >60 mL/min (ref >60)
Glucose, Bld: 111 mg/dL — ABNORMAL HIGH (ref 65–99)
Potassium: 3.9 mmol/L (ref 3.5–5.1)
Sodium: 136 mmol/L (ref 135–145)
Total Bilirubin: 11.5 mg/dL — ABNORMAL HIGH (ref 0.3–1.2)
Total Protein: 4.4 g/dL — ABNORMAL LOW (ref 6.5–8.1)

## 2015-03-23 LAB — CBC
HCT: 31 % — ABNORMAL LOW (ref 39.0–52.0)
Hemoglobin: 10.4 g/dL — ABNORMAL LOW (ref 13.0–17.0)
MCH: 34.2 pg — ABNORMAL HIGH (ref 26.0–34.0)
MCHC: 33.5 g/dL (ref 30.0–36.0)
MCV: 102 fL — ABNORMAL HIGH (ref 78.0–100.0)
Platelets: 41 10*3/uL — ABNORMAL LOW (ref 150–400)
RBC: 3.04 MIL/uL — ABNORMAL LOW (ref 4.22–5.81)
RDW: 15.7 % — ABNORMAL HIGH (ref 11.5–15.5)
WBC: 4 10*3/uL (ref 4.0–10.5)

## 2015-03-23 NOTE — Progress Notes (Signed)
CSW spoke with patient's son regarding patient's discharge plan. Patient's son reported that his father lives in his yard and that the son is available when patient needs help. Patient's son is able to come and pick up patient from hospital at discharge. CSW will continue to follow for discharge needs.   Gary [name] LCSWA [phone]

## 2015-03-23 NOTE — Progress Notes (Addendum)
[facility]                                                                              Patient Demographics  Gary [name], is a 55 y.o. male, DOB - 21-Feb-1960, ZOX:[id number]  Admit date - 03/18/2015   Admitting Physician Gery Pray, MD  Outpatient Primary MD for the patient is No primary care provider on file.  LOS - 5   Chief Complaint  Patient presents with  . Altered Mental Status       Brief HPI   Patient is a 55 year old male with known history of chronic subdural hematoma, seizures, cirrhosis, depression was brought to the ED with confusion and not acting right.   Assessment & Plan    Metabolic encephalopathy: Possibly due to UTI versus SBP, hepatic encephalopathy. Urine drug screen positive for THC  - alert and oriented but confused at times and pulling his IVs. Per RN report, intermittently confused. - Continue lactulose, rifaximin - Completed CIWA with ativan, recommended alcohol cessation to the patient - PT evaluation recommended skilled nursing facility-> SW consult  Transaminitis in the setting of hepatic cirrhosis, significant liver disease, bilirubin 13, marked ascites, hepatitis C and alcohol ingestion - GI was consulted, recommended continuing lactulose, rifaximin. Propranolol, spironolactone as BP allows  - Follow hepatitis viral load- in process  - Recommended patient to be abstinent from alcohol  - Ultrasound-guided paracentesis done yesterday, 6 L ascitic fluid removed, studies not consistent with SBP. DC rocephin at discharge  - LFTs improving, bilirubin improving   Alcohol abuse with alcohol withdrawal  -completed CIWA protocol, now alert and oriented  Urinary tract infection - Continue IV Rocephin, urine culture shows multiple species. DC rocephin upon discharge  HCV (hepatitis C virus) -Monitor viral load  Thrombocytopenia  -Chronic, unchanged. Monitor  -No Lovenox for DVT prophylaxis   Chronic subdural hematoma -  CT head negative for any acute intracranial abnormality  Code Status: full code   Family Communication: No family member at the bedside  Disposition Plan: Discussed with social work, patient's son wants to take patient home when patient is medically ready. Patient is still somewhat confused intermittently today, pulling his IV lines. Will watch another 24 hours and hopefully DC home with son if stable in a.m.  Time Spent in minutes  25 minutes  Procedures  CT head, abdomen and pelvis Ultrasound-guided paracentesis  Consults   Gastroenterology  DVT Prophylaxis  SCD's  Medications  Scheduled Meds: . cefTRIAXone (ROCEPHIN)  IV  2 g Intravenous Q24H  . folic acid  1 mg Oral Daily  . lactulose  10 g Oral BID  . LORazepam  0-4 mg Intravenous Q12H  . multivitamin with minerals  1 tablet Oral Daily  . pantoprazole (PROTONIX) IV  40 mg Intravenous QHS  . rifaximin  550 mg Oral BID  . thiamine  100 mg Oral Daily   Or  . thiamine  100 mg Intravenous Daily   Continuous Infusions:  PRN Meds:.   Antibiotics   Anti-infectives    Start     Dose/Rate Route Frequency Ordered Stop   03/19/15 2000  cefTRIAXone (ROCEPHIN) 1 g  in dextrose 5 % 50 mL IVPB  Status:  Discontinued     1 g 100 mL/hr over 30 Minutes Intravenous Every 24 hours 03/18/15 2336 03/19/15 0902   03/19/15 2000  cefTRIAXone (ROCEPHIN) 2 g in dextrose 5 % 50 mL IVPB     2 g 100 mL/hr over 30 Minutes Intravenous Every 24 hours 03/19/15 0902     03/19/15 0015  rifaximin (XIFAXAN) tablet 550 mg     550 mg Oral 2 times daily 03/18/15 2336     03/18/15 2000  cefTRIAXone (ROCEPHIN) 1 g in dextrose 5 % 50 mL IVPB     1 g 100 mL/hr over 30 Minutes Intravenous  Once 03/18/15 1955 03/18/15 2155        Subjective:   Gary [name] was seen and examined today. Alert and oriented, denies any specific complaints, feels better after paracentesis yesterday. No fevers or chills or any abdominal pain, denies any chest pain,  shortness of breath, nausea, vomiting.    Objective:   Filed Vitals:   03/22/15 1543 03/22/15 1549 03/22/15 2352 03/23/15 0534  BP: 100/57 97/53 94/51  95/67  Pulse:   64 64  Temp:   97.7 F (36.5 C) 99 F (37.2 C)  TempSrc:   Oral Oral  Resp:   18 15  Height:      Weight:      SpO2:   98% 96%    Intake/Output Summary (Last 24 hours) at 03/23/15 1301 Last data filed at 03/22/15 2331  Gross per 24 hour  Intake    340 ml  Output    200 ml  Net    140 ml     Wt Readings from Last 3 Encounters:  03/19/15 97.9 kg (215 lb 13.3 oz)  11/09/14 100.4 kg (221 lb 5.5 oz)  10/22/14 99.4 kg (219 lb 2.2 oz)     Exam  General: Alert and oriented 3, multiple tattoos, intermittent confusion  HEENT:  icteric Sclera +  Neck:   CVS: S1 S2 clear, RRR  Respiratory: CTA anteriorly  Abdomen: Soft, nontender, soft, distended, + bowel sounds  Ext: no cyanosis clubbing, 1+ edema  Neuro: strength 5/5 in upper and lower extremities  Skin: No rashes  Psych: alert and oriented 3,   Data Review   Micro Results Recent Results (from the past 240 hour(s))  Urine culture     Status: None   Collection Time: 03/18/15  7:18 PM  Result Value Ref Range Status   Specimen Description URINE, CATHETERIZED  Final   Special Requests NONE  Final   Culture MULTIPLE SPECIES PRESENT, SUGGEST RECOLLECTION  Final   Report Status 03/20/2015 FINAL  Final  Gram stain     Status: None   Collection Time: 03/22/15  4:17 PM  Result Value Ref Range Status   Specimen Description FLUID ASCITIC PARACENTESIS  Final   Special Requests NONE  Final   Gram Stain   Final    RARE WBC PRESENT, PREDOMINANTLY MONONUCLEAR NO ORGANISMS SEEN    Report Status 03/22/2015 FINAL  Final    Radiology Reports Dg Chest 2 View  03/18/2015  CLINICAL DATA:  Altered mental status. EXAM: CHEST  2 VIEW COMPARISON:  11/05/2014 FINDINGS: Heart, mediastinum hila are unremarkable. Lung volumes are low. Lungs are clear. No  pleural effusion or pneumothorax. Bony thorax is intact. IMPRESSION: No active cardiopulmonary disease. Electronically Signed   By: Amie Portlandavid  Ormond M.D.   On: 03/18/2015 17:56   Ct Head Wo Contrast  03/18/2015  CLINICAL DATA:  55 year old male with acute altered mental status. EXAM: CT HEAD WITHOUT CONTRAST TECHNIQUE: Contiguous axial images were obtained from the base of the skull through the vertex without intravenous contrast. COMPARISON:  10/31/2013 and prior CTs FINDINGS: Atrophy and chronic small-vessel white matter ischemic changes are again identified. A low-density 2 mm right frontal subdural collection is compatible with residual changes from prior subdural hematoma. There is no evidence of acute infarction, hemorrhage, new extra-axial collection, midline shift, hydrocephalus or mass lesion. No acute bony abnormalities are identified. Right frontal craniotomy changes are present. IMPRESSION: No evidence of acute intracranial abnormality. Atrophy, chronic small-vessel white matter ischemic changes and 2 mm chronic right frontal subdural collections/changes. Electronically Signed   By: Harmon Pier M.D.   On: 03/18/2015 20:36   Ct Abdomen Pelvis W Contrast  03/19/2015  CLINICAL DATA:  Elevated LFTs. Biliary and hepatic encephalopathy. Initial encounter. EXAM: CT ABDOMEN AND PELVIS WITH CONTRAST TECHNIQUE: Multidetector CT imaging of the abdomen and pelvis was performed using the standard protocol following bolus administration of intravenous contrast. CONTRAST:  OMNIPAQUE IOHEXOL 300 MG/ML  SOLN COMPARISON:  Right upper quadrant ultrasound performed 09/25/2012, and CT of the abdomen and pelvis performed 09/18/2012 FINDINGS: Trace bilateral pleural effusions are noted, with associated atelectasis. Minimal bilateral gynecomastia is noted. Large volume ascites is seen within the abdomen and pelvis. There is a diffusely nodular contour of the liver, compatible with hepatic cirrhosis. Underlying mass  cannot be excluded, but is not well assessed on this study. The spleen is enlarged, measuring 17.6 cm in length. Scattered stones are seen within the gallbladder. The gallbladder is not well assessed given surrounding ascites. The pancreas and adrenal glands are grossly unremarkable. 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 The kidneys are unremarkable in appearance. There is no evidence of hydronephrosis. No renal or ureteral stones are seen. No perinephric stranding is appreciated. The small bowel is unremarkable in appearance. The stomach is within normal limits. No acute vascular abnormalities are seen. The patient is status post appendectomy. Mild mucosal thickening is suggested along the entirety of the colon, though this may simply reflect surrounding ascites. Would correlate for any evidence of enteropathy. The bladder is mildly distended and grossly unremarkable. The prostate remains normal in size. No inguinal lymphadenopathy is seen. Diffuse soft tissue edema is noted about the abdomen and pelvis, concerning for mild anasarca. No acute osseous abnormalities are identified. IMPRESSION: 1. Large volume ascites within the abdomen and pelvis. 2. Diffusely nodular contour of the liver, compatible with hepatic cirrhosis. Underlying mass cannot be excluded, but is not well assessed on this study. Given the patient's elevated LFTs, this could be further assessed on dynamic liver protocol MRI or CT, as deemed clinically appropriate. 3. Mild mucosal thickening along the entirety of the colon may simply reflect surrounding ascites. Would correlate for any evidence of enteropathy. 4. Splenomegaly noted. 5. Cholelithiasis. Gallbladder not well assessed given surrounding ascites. 6. Splenic varices noted. 7. Diffuse soft tissue edema about the abdomen and pelvis, concerning for mild anasarca. Electronically Signed   By: Roanna Raider M.D.   On: 03/19/2015 05:26   US Paracentesis  03/22/2015  INDICATION: History of hepatitis-C and  cirrhosis with elevated liver function tests. Request has been made for diagnostic and therapeutic paracentesis. EXAM: ULTRASOUND GUIDED DIAGNOSTIC AND THERAPEUTIC PARACENTESIS MEDICATIONS: 1% lidocaine COMPLICATIONS: None immediate. PROCEDURE: Informed written consent was obtained from the patient after a discussion of the risks, benefits and alternatives to treatment. A timeout was performed prior  to the initiation of the procedure. Initial ultrasound scanning demonstrates a large amount of ascites within the right lower abdominal quadrant. The right lower abdomen was prepped and draped in the usual sterile fashion. 1% lidocaine was used for local anesthesia. Following this, a Safe-T-Centesis catheter was introduced. An ultrasound image was saved for documentation purposes. The paracentesis was performed. The catheter was removed and a dressing was applied. The patient tolerated the procedure well without immediate post procedural complication. FINDINGS: A total of approximately 6 L of bright yellow fluid was removed. Samples were sent to the laboratory as requested by the clinical team. IMPRESSION: Successful ultrasound-guided paracentesis yielding 6 liters of peritoneal fluid. Read by: Barnetta Chapel, PA-C Electronically Signed   By: Simonne Come M.D.   On: 03/22/2015 16:11    CBC  Recent Labs Lab 03/18/15 1721 03/19/15 0616 03/22/15 1112 03/23/15 0548  WBC 7.3 6.2 4.0 4.0  HGB 13.1 12.3* 10.7* 10.4*  HCT 38.3* 35.8* 32.0* 31.0*  PLT 63* 52* 47* 41*  MCV 104.1* 103.2* 101.9* 102.0*  MCH 35.6* 35.4* 34.1* 34.2*  MCHC 34.2 34.4 33.4 33.5  RDW 15.9* 16.1* 15.5 15.7*    Chemistries   Recent Labs Lab 03/18/15 1721 03/19/15 0616 03/19/15 1042 03/22/15 1112 03/23/15 0548  NA 138 136  --  140 136  K 4.6 4.3  --  4.1 3.9  CL 102 101  --  107 105  CO2 23 25  --  25 25  GLUCOSE 97 94  --  82 111*  BUN 14 16  --  17 13  CREATININE 0.92 0.80  --  0.92 1.08  CALCIUM 9.1 8.8*  --  8.6* 8.0*   AST 158* 177*  --  110* 98*  ALT 60 64*  --  53 46  ALKPHOS 105 87  --  72 83  BILITOT 13.6* 13.8* 13.2* 13.2* 11.5*   ------------------------------------------------------------------------------------------------------------------ estimated creatinine clearance is 92.1 mL/min (by C-G formula based on Cr of 1.08). ------------------------------------------------------------------------------------------------------------------ No results for input(s): HGBA1C in the last 72 hours. ------------------------------------------------------------------------------------------------------------------ No results for input(s): CHOL, HDL, LDLCALC, TRIG, CHOLHDL, LDLDIRECT in the last 72 hours. ------------------------------------------------------------------------------------------------------------------ No results for input(s): TSH, T4TOTAL, T3FREE, THYROIDAB in the last 72 hours.  Invalid input(s): FREET3 ------------------------------------------------------------------------------------------------------------------ No results for input(s): VITAMINB12, FOLATE, FERRITIN, TIBC, IRON, RETICCTPCT in the last 72 hours.  Coagulation profile  Recent Labs Lab 03/19/15 0616  INR 3.09*    No results for input(s): DDIMER in the last 72 hours.  Cardiac Enzymes No results for input(s): CKMB, TROPONINI, MYOGLOBIN in the last 168 hours.  Invalid input(s): CK ------------------------------------------------------------------------------------------------------------------ Invalid input(s): POCBNP  No results for input(s): GLUCAP in the last 72 hours.   RAI,RIPUDEEP M.D. [facility] 03/23/2015, 1:01 PM  Pager: [phone] Between 7am to 7pm - call Pager - [phone]  After 7pm go to [email or web address] - password [email or web address]  Call night coverage person covering after 7pm

## 2015-03-24 DIAGNOSIS — K7460 Unspecified cirrhosis of liver: Secondary | ICD-10-CM

## 2015-03-24 DIAGNOSIS — L8990 Pressure ulcer of unspecified site, unspecified stage: Secondary | ICD-10-CM

## 2015-03-24 DIAGNOSIS — B182 Chronic viral hepatitis C: Secondary | ICD-10-CM

## 2015-03-24 DIAGNOSIS — R569 Unspecified convulsions: Secondary | ICD-10-CM

## 2015-03-24 DIAGNOSIS — I6200 Nontraumatic subdural hemorrhage, unspecified: Secondary | ICD-10-CM

## 2015-03-24 DIAGNOSIS — G9341 Metabolic encephalopathy: Secondary | ICD-10-CM

## 2015-03-24 LAB — CBC
HEMATOCRIT: 32.5 % — AB (ref 39.0–52.0)
HEMOGLOBIN: 11.3 g/dL — AB (ref 13.0–17.0)
MCH: 35.6 pg — AB (ref 26.0–34.0)
MCHC: 34.8 g/dL (ref 30.0–36.0)
MCV: 102.5 fL — ABNORMAL HIGH (ref 78.0–100.0)
Platelets: 39 10*3/uL — ABNORMAL LOW (ref 150–400)
RBC: 3.17 MIL/uL — AB (ref 4.22–5.81)
RDW: 15.9 % — ABNORMAL HIGH (ref 11.5–15.5)
WBC: 4.4 10*3/uL (ref 4.0–10.5)

## 2015-03-24 LAB — PATHOLOGIST SMEAR REVIEW

## 2015-03-24 MED ORDER — SPIRONOLACTONE 50 MG PO TABS: 50.0000 mg | ORAL_TABLET | Freq: Every day | ORAL | Status: AC

## 2015-03-24 MED ORDER — PROPRANOLOL HCL 10 MG PO TABS: 10.0000 mg | ORAL_TABLET | Freq: Every day | ORAL | Status: AC

## 2015-03-24 MED ORDER — RIFAXIMIN 550 MG PO TABS: 550.0000 mg | ORAL_TABLET | Freq: Two times a day (BID) | ORAL | Status: AC

## 2015-03-24 MED ORDER — LACTULOSE 10 GM/15ML PO SOLN: 10.0000 g | Freq: Two times a day (BID) | ORAL | Status: AC

## 2015-03-24 NOTE — Progress Notes (Signed)
PIV removed.  Reviewed discharge paperwork with pt and son.  Pt taken to discharge location via wheelchair.

## 2015-03-24 NOTE — Discharge Instructions (Signed)
Ascites °Ascites is a collection of excess fluid in the abdomen. Ascites can range from mild to severe. It can get worse without treatment. °CAUSES °Possible causes include: °· Cirrhosis. This is the most common cause of ascites. °· Infection or inflammation in the abdomen. °· Cancer in the abdomen. °· Heart failure. °· Kidney disease. °· Inflammation of the pancreas. °· Clots in the veins of the liver. °SIGNS AND SYMPTOMS °Signs and symptoms may include: °· A feeling of fullness in your abdomen. This is common. °· An increase in the size of your abdomen or your waist. °· Swelling in your legs. °· Swelling of the scrotum in men. °· Difficulty breathing. °· Abdominal pain. °· Sudden weight gain. °If the condition is mild, you may not have symptoms. °DIAGNOSIS °To make a diagnosis, your health care provider will: °· Ask about your medical history. °· Perform a physical exam. °· Order imaging tests, such as an ultrasound or CT scan of your abdomen. °TREATMENT °Treatment depends on the cause of the ascites. It may include: °· Taking a pill to make you urinate. This is called a water pill (diuretic pill). °· Strictly reducing your salt (sodium) intake. Salt can cause extra fluid to be kept in the body, and this makes ascites worse. °· Having a procedure to remove fluid from your abdomen (paracentesis). °· Having a procedure to transfer fluid from your abdomen into a vein. °· Having a procedure that connects two of the major veins within your liver and relieves pressure on your liver (TIPS procedure). °Ascites may go away or improve with treatment of the condition that caused it.  °HOME CARE INSTRUCTIONS °· Keep track of your weight. To do this, weigh yourself at the same time every day and record your weight. °· Keep track of how much you drink and any changes in the amount you urinate. °· Follow any instructions that your health care provider gives you about how much to drink. °· Try not to eat salty (high-sodium)  foods. °· Take medicines only as directed by your health care provider. °· Keep all follow-up visits as directed by your health care provider. This is important. °· Report any changes in your health to your health care provider, especially if you develop new symptoms or your symptoms get worse. °SEEK MEDICAL CARE IF: °· Your gain more than 3 pounds in 3 days. °· Your abdominal size or your waist size increases. °· You have new swelling in your legs. °· The swelling in your legs gets worse. °SEEK IMMEDIATE MEDICAL CARE IF: °· You develop a fever. °· You develop confusion. °· You develop new or worsening difficulty breathing. °· You develop new or worsening abdominal pain. °· You develop new or worsening swelling in the scrotum (in men). °  °This information is not intended to replace advice given to you by your health care provider. Make sure you discuss any questions you have with your health care provider. °  °Document Released: 01/02/2005 Document Revised: 01/23/2014 Document Reviewed: 08/01/2013 °Elsevier Interactive Patient Education ©2016 [employer] ° °

## 2015-03-24 NOTE — Progress Notes (Signed)
PT Cancellation Note  Patient Details Name: Gary CrossRandall S Heinkel MRN: [id number] DOB: 10/07/1960   Cancelled Treatment:    Reason Eval/Treat Not Completed: Other (comment) (Pt agreed to walk then declined as he had not finished lunch).  Will try later if time allows.   Ivar DrapeStout, Reeve Turnley E 03/24/2015, 2:32 PM   Samul Dadauth Stevin Bielinski, PT MS Acute Rehab Dept. Number: [facility] [phone] and [facility] [phone]

## 2015-03-24 NOTE — Discharge Summary (Addendum)
Physician Discharge Summary  Norris CrossRandall S Fath ZOX:[id number] DOB: 01/07/1961 DOA: 03/18/2015  PCP: No primary care provider on file.  Admit date: 03/18/2015 Discharge date: 03/24/2015  Time spent: >30 minutes  Recommendations for Outpatient Follow-up:  1. Follow up with Dr. Randa EvensEdwards in 1-2 weeks 2. Home health on discharge  Discharge Diagnoses:  Active Problems:   Cirrhosis (HCC)   Seizure (HCC)   HCV (hepatitis C virus)   Metabolic encephalopathy   Transaminitis   SDH (subdural hematoma) (HCC)   Pressure ulcer  Discharge Condition: guarded, patient with ongoing ETOH abuse and advanced liver disease, medical non compliance, refusing SNF  Diet recommendation: low salt  Filed Weights   03/19/15 0549  Weight: 97.9 kg (215 lb 13.3 oz)    History of present illness:  See H&P, Labs, Consult and Test reports for all details in brief, patient is a 55 year old male with known history of chronic subdural hematoma, seizures, cirrhosis, depression was brought to the ED with confusion and not acting right.  Hospital Course:  Metabolic encephalopathy: Possibly due to UTI versus SBP, hepatic encephalopathy. [facility] patient with hepatic encephalopathy due to non compliance at home, mental status slowly improved with lactulose and rifaximin, on the day of discharge he was AxOx4 and returned to baseline. Prescriptions given to patient for home medications on d/c. Completed CIWA with ativan, recommended alcohol cessation to the patient. PT evaluation recommended skilled nursing facility-> SW consult, patient and his son preferred to go home. Transaminitis in the setting of hepatic cirrhosis, significant liver disease, bilirubin 13, marked ascites, hepatitis C and alcohol ingestion - GI was consulted, recommended continuing lactulose, rifaximin. Propranolol, spironolactone. Follow hepatitis viral load- in process. Recommended patient to be abstinent from alcohol.  Ultrasound-guided paracentesis 3.6 with 6 L ascitic fluid removed, studies not consistent with SBP. LFTs improving, bilirubin improving. MELD 28 suggesting ~20% 3 month mortality. Alcohol abuse with alcohol withdrawal - completed CIWA protocol, now alert and oriented Urinary tract infection - empirically on IV Rocephin, urine culture shows multiple species. HCV (hepatitis C virus) -Monitor viral load. Outpatient GI follow up. Thrombocytopenia -Chronic, unchanged. Monitor  Chronic subdural hematoma - CT head negative for any acute intracranial abnormality Stage 1 pressure ulcer buttocks  Procedures:  Paracentesis   Consultations:  GI  Discharge Exam: Filed Vitals:   03/23/15 1845 03/23/15 2351 03/24/15 0016 03/24/15 0651  BP: 101/69 82/43 101/64 96/60  Pulse: 66 63 62 61  Temp: 98.6 F (37 C) 98.5 F (36.9 C)  98.4 F (36.9 C)  TempSrc: Oral Oral  Oral  Resp: 19 15  16   Height:      Weight:      SpO2: 95% 97%  95%    General: NAD Cardiovascular: RRR Respiratory: CTA biL  Discharge Instructions Activity:  As tolerated   Get Medicines reviewed and adjusted: Please take all your medications with you for your next visit with your Primary MD  Please request your Primary MD to go over all hospital tests and procedure/radiological results at the follow up, please ask your Primary MD to get all Hospital records sent to his/her office.  If you experience worsening of your admission symptoms, develop shortness of breath, life threatening emergency, suicidal or homicidal thoughts you must seek medical attention immediately by calling 911 or calling your MD immediately if symptoms less severe.  You must read complete instructions/literature along with all the possible adverse reactions/side effects for all the Medicines you take and that  have been prescribed to you. Take any new Medicines after you have completely understood and accpet all the possible adverse reactions/side  effects.   Do not drive when taking Pain medications.   Do not take more than prescribed Pain, Sleep and Anxiety Medications  Special Instructions: If you have smoked or chewed Tobacco in the last 2 yrs please stop smoking, stop any regular Alcohol and or any Recreational drug use.  Wear Seat belts while driving.  Please note  You were cared for by a hospitalist during your hospital stay. Once you are discharged, your primary care physician will handle any further medical issues. Please note that NO REFILLS for any discharge medications will be authorized once you are discharged, as it is imperative that you return to your primary care physician (or establish a relationship with a primary care physician if you do not have one) for your aftercare needs so that they can reassess your need for medications and monitor your lab values.    Medication List    TAKE these medications        lactulose 10 GM/15ML solution  Commonly known as:  CHRONULAC  Take 15 mLs (10 g total) by mouth 2 (two) times daily.     propranolol 10 MG tablet  Commonly known as:  INDERAL  Take 1 tablet (10 mg total) by mouth daily.     rifaximin 550 MG Tabs tablet  Commonly known as:  XIFAXAN  Take 1 tablet (550 mg total) by mouth 2 (two) times daily.     spironolactone 50 MG tablet  Commonly known as:  ALDACTONE  Take 1 tablet (50 mg total) by mouth daily.           Follow-up Information    Follow up with EDWARDS JR,JAMES L, MD. Schedule an appointment as soon as possible for a visit in 2 weeks.   Specialty:  Gastroenterology   Why:  Patient has to have authorization to come to them before they can make an appointment   Contact information:   [address] [place] [address] [phone]       Follow up with [facility].   Why:  Health Home PT arranged   Contact information:   [address] [place] [address] [phone]       Follow up with [facility]. Go on 03/31/2015.   Why:  Follow up hospital visit  on 03/31/15 at 11:00 am   Contact information:   [address] [place] [address] [phone]      The results of significant diagnostics from this hospitalization (including imaging, microbiology, ancillary and laboratory) are listed below for reference.    Significant Diagnostic Studies: Dg Chest 2 View  03/18/2015  CLINICAL DATA:  Altered mental status. EXAM: CHEST  2 VIEW COMPARISON:  11/05/2014 FINDINGS: Heart, mediastinum hila are unremarkable. Lung volumes are low. Lungs are clear. No pleural effusion or pneumothorax. Bony thorax is intact. IMPRESSION: No active cardiopulmonary disease. Electronically Signed   By: Amie Portland M.D.   On: 03/18/2015 17:56   Ct Head Wo Contrast  03/18/2015  CLINICAL DATA:  55 year old male with acute altered mental status. EXAM: CT HEAD WITHOUT CONTRAST TECHNIQUE: Contiguous axial images were obtained from the base of the skull through the vertex without intravenous contrast. COMPARISON:  10/31/2013 and prior CTs FINDINGS: Atrophy and chronic small-vessel white matter ischemic changes are again identified. A low-density 2 mm  right frontal subdural collection is compatible with residual changes from prior subdural hematoma. There is no evidence of acute infarction, hemorrhage, new extra-axial collection, midline shift, hydrocephalus or mass lesion. No acute bony abnormalities are identified. Right frontal craniotomy changes are present. IMPRESSION: No evidence of acute intracranial abnormality. Atrophy, chronic small-vessel white matter ischemic changes and 2 mm chronic right frontal subdural collections/changes. Electronically Signed   By: Harmon Pier M.D.   On: 03/18/2015 20:36   Ct Abdomen Pelvis W Contrast  03/19/2015  CLINICAL DATA:  Elevated LFTs. Biliary and hepatic encephalopathy. Initial encounter. EXAM: CT ABDOMEN AND PELVIS WITH CONTRAST  TECHNIQUE: Multidetector CT imaging of the abdomen and pelvis was performed using the standard protocol following bolus administration of intravenous contrast. CONTRAST:  OMNIPAQUE IOHEXOL 300 MG/ML  SOLN COMPARISON:  Right upper quadrant ultrasound performed 09/25/2012, and CT of the abdomen and pelvis performed 09/18/2012 FINDINGS: Trace bilateral pleural effusions are noted, with associated atelectasis. Minimal bilateral gynecomastia is noted. Large volume ascites is seen within the abdomen and pelvis. There is a diffusely nodular contour of the liver, compatible with hepatic cirrhosis. Underlying mass cannot be excluded, but is not well assessed on this study. The spleen is enlarged, measuring 17.6 cm in length. Scattered stones are seen within the gallbladder. The gallbladder is not well assessed given surrounding ascites. The pancreas and adrenal glands are grossly unremarkable. 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 The kidneys are unremarkable in appearance. There is no evidence of hydronephrosis. No renal or ureteral stones are seen. No perinephric stranding is appreciated. The small bowel is unremarkable in appearance. The stomach is within normal limits. No acute vascular abnormalities are seen. The patient is status post appendectomy. Mild mucosal thickening is suggested along the entirety of the colon, though this may simply reflect surrounding ascites. Would correlate for any evidence of enteropathy. The bladder is mildly distended and grossly unremarkable. The prostate remains normal in size. No inguinal lymphadenopathy is seen. Diffuse soft tissue edema is noted about the abdomen and pelvis, concerning for mild anasarca. No acute osseous abnormalities are identified. IMPRESSION: 1. Large volume ascites within the abdomen and pelvis. 2. Diffusely nodular contour of the liver, compatible with hepatic cirrhosis. Underlying mass cannot be excluded, but is not well assessed on this study. Given the patient's  elevated LFTs, this could be further assessed on dynamic liver protocol MRI or CT, as deemed clinically appropriate. 3. Mild mucosal thickening along the entirety of the colon may simply reflect surrounding ascites. Would correlate for any evidence of enteropathy. 4. Splenomegaly noted. 5. Cholelithiasis. Gallbladder not well assessed given surrounding ascites. 6. Splenic varices noted. 7. Diffuse soft tissue edema about the abdomen and pelvis, concerning for mild anasarca. Electronically Signed   By: Roanna Raider M.D.   On: 03/19/2015 05:26   US Paracentesis  03/22/2015  INDICATION: History of hepatitis-C and cirrhosis with elevated liver function tests. Request has been made for diagnostic and therapeutic paracentesis. EXAM: ULTRASOUND GUIDED DIAGNOSTIC AND THERAPEUTIC PARACENTESIS MEDICATIONS: 1% lidocaine COMPLICATIONS: None immediate. PROCEDURE: Informed written consent was obtained from the patient after a discussion of the risks, benefits and alternatives to treatment. A timeout was performed prior to the initiation of the procedure. Initial ultrasound scanning demonstrates a large amount of ascites within the right lower abdominal quadrant. The right lower abdomen was prepped and draped in the usual sterile fashion. 1% lidocaine was used for local anesthesia. Following this, a Safe-T-Centesis catheter was introduced. An ultrasound image was saved for documentation purposes. The  paracentesis was performed. The catheter was removed and a dressing was applied. The patient tolerated the procedure well without immediate post procedural complication. FINDINGS: A total of approximately 6 L of bright yellow fluid was removed. Samples were sent to the laboratory as requested by the clinical team. IMPRESSION: Successful ultrasound-guided paracentesis yielding 6 liters of peritoneal fluid. Read by: Barnetta Chapel, PA-C Electronically Signed   By: Simonne Come M.D.   On: 03/22/2015 16:11    Microbiology: Recent  Results (from the past 240 hour(s))  Urine culture     Status: None   Collection Time: 03/18/15  7:18 PM  Result Value Ref Range Status   Specimen Description URINE, CATHETERIZED  Final   Special Requests NONE  Final   Culture MULTIPLE SPECIES PRESENT, SUGGEST RECOLLECTION  Final   Report Status 03/20/2015 FINAL  Final  Culture, body fluid-bottle     Status: None (Preliminary result)   Collection Time: 03/22/15  4:17 PM  Result Value Ref Range Status   Specimen Description FLUID ASCITIC PARACENTESIS  Final   Special Requests BOTTLES DRAWN AEROBIC AND ANAEROBIC 10CC  Final   Culture NO GROWTH 2 DAYS  Final   Report Status PENDING  Incomplete  Gram stain     Status: None   Collection Time: 03/22/15  4:17 PM  Result Value Ref Range Status   Specimen Description FLUID ASCITIC PARACENTESIS  Final   Special Requests NONE  Final   Gram Stain   Final    RARE WBC PRESENT, PREDOMINANTLY MONONUCLEAR NO ORGANISMS SEEN    Report Status 03/22/2015 FINAL  Final     Labs: Basic Metabolic Panel:  Recent Labs Lab 03/18/15 1721 03/19/15 0616 03/22/15 1112 03/23/15 0548  NA 138 136 140 136  K 4.6 4.3 4.1 3.9  CL 102 101 107 105  CO2 GLUCOSE 97 94 82 111*  BUN CREATININE 0.92 0.80 0.92 1.08  CALCIUM 9.1 8.8* 8.6* 8.0*   Liver Function Tests:  Recent Labs Lab 03/18/15 1721 03/19/15 0616 03/19/15 1042 03/22/15 1112 03/23/15 0548  AST 158* 177*  --  110* 98*  ALT 60 64*  --  53 46  ALKPHOS 105 87  --  72 83  BILITOT 13.6* 13.8* 13.2* 13.2* 11.5*  PROT 6.7 6.0*  --  4.8* 4.4*  ALBUMIN 2.7* 2.4*  --  1.8* 1.6*   No results for input(s): LIPASE, AMYLASE in the last 168 hours.  Recent Labs Lab 03/18/15 1700 03/19/15 0616  AMMONIA 39* 40*   CBC:  Recent Labs Lab 03/18/15 1721 03/19/15 0616 03/22/15 1112 03/23/15 0548 03/24/15 0708  WBC 7.3 6.2 4.0 4.0 4.4  HGB 13.1 12.3* 10.7* 10.4* 11.3*  HCT 38.3* 35.8* 32.0* 31.0* 32.5*  MCV 104.1*  103.2* 101.9* 102.0* 102.5*  PLT 63* 52* 47* 41* 39*   Cardiac Enzymes:  Recent Labs Lab 03/18/15 2214  CKTOTAL 253   BNP: BNP (last 3 results)  Recent Labs  10/20/14 1518 11/05/14 0900  BNP 191.0* 65.5     Signed:  Jaid Quirion  [facility] 03/24/2015, 6:57 PM

## 2015-03-24 NOTE — Care Management (Addendum)
CM spoke with pt regarding home health services(RN) and pt agreed to receive services. Choice given and pt selected [facility]. CM made referral to [facility] @ [phone] for HHPT. Plan is for pt to d/c to home today. HH services are in place and hospital follow up [facility]15 [facility]00[facility] Gae Gallopngela Horatio Bertz RN,BSN,CM [phone]

## 2015-03-27 LAB — CULTURE, BODY FLUID W GRAM STAIN -BOTTLE: Culture: NO GROWTH

## 2015-03-27 LAB — CULTURE, BODY FLUID-BOTTLE

## 2015-03-29 LAB — HCV RNA QUANT RFLX ULTRA OR GENOTYP
HCV RNA Qnt(log copy/mL): 4.36 log10 IU/mL
HepC Qn: 22900 IU/mL

## 2015-03-29 LAB — HEPATITIS C GENOTYPE

## 2015-03-31 ENCOUNTER — Inpatient Hospital Stay: Payer: Medicaid Other | Admitting: Internal Medicine

## 2015-04-17 DEATH — deceased

## 2016-07-27 IMAGING — CT CT ABD-PELV W/ CM
2 of 5 series · 15 of 46 positions shown, 17 images · IV contrast (Omni 300)
Comparison: Right upper quadrant ultrasound performed 09/25/2012,
and CT of the abdomen and pelvis performed 09/18/2012

CLINICAL DATA: Elevated LFTs. Biliary and hepatic encephalopathy.
Initial encounter.

EXAM:
CT ABDOMEN AND PELVIS WITH CONTRAST
TECHNIQUE: Multidetector CT imaging of the abdomen and pelvis was performed
using the standard protocol following bolus administration of
intravenous contrast.
CONTRAST:  100mL OMNIPAQUE IOHEXOL 300 MG/ML  SOLN

[Series 2: a/p w/ 5mm · axial · 0.87mm/px · z∈[+852,+1316]mm · 12 of 105 slices shown, 14 images]
[im 6/105  soft-tissue]
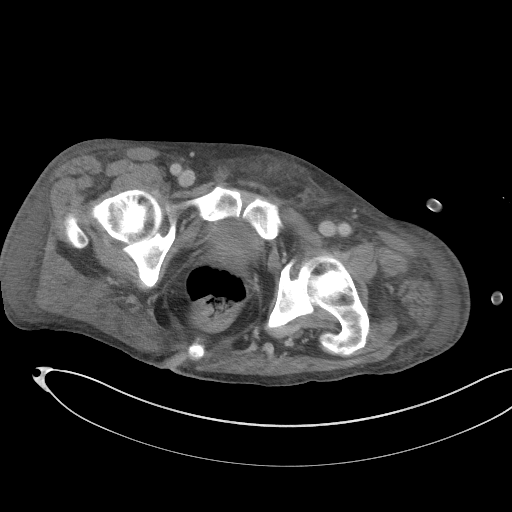
[im 6/105  bone]
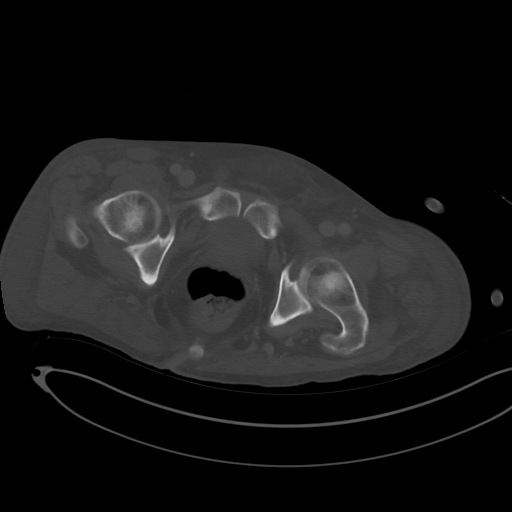
[im 17/105  soft-tissue]
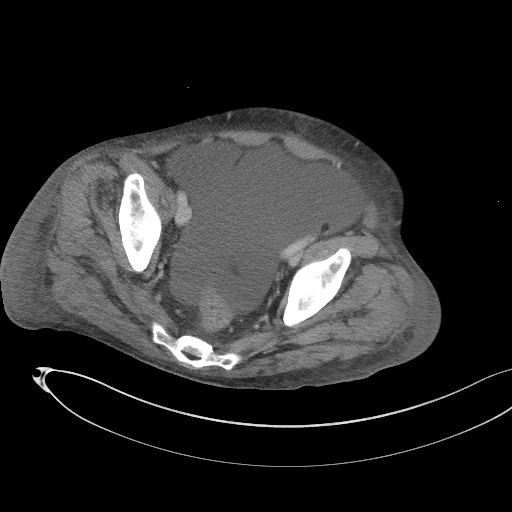
[im 22/105  soft-tissue]
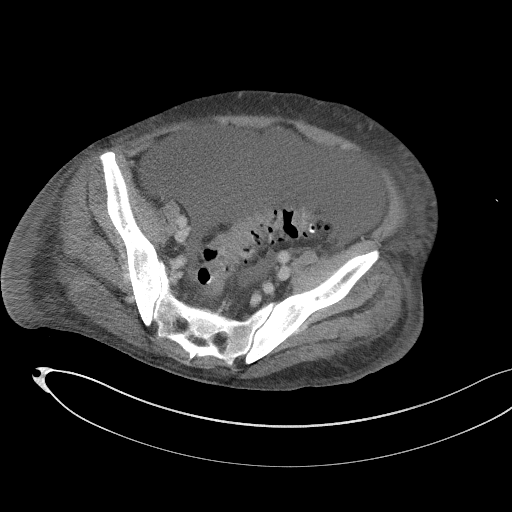
[im 33/105  soft-tissue]
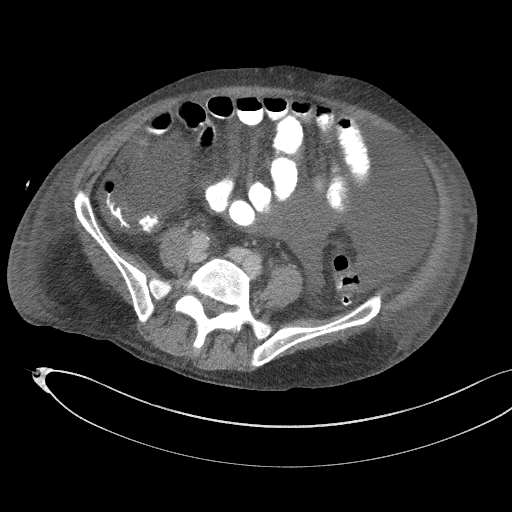
[im 39/105  soft-tissue]
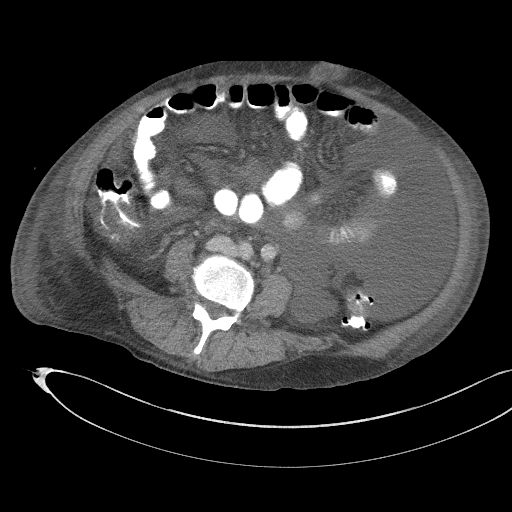
[im 50/105  soft-tissue]
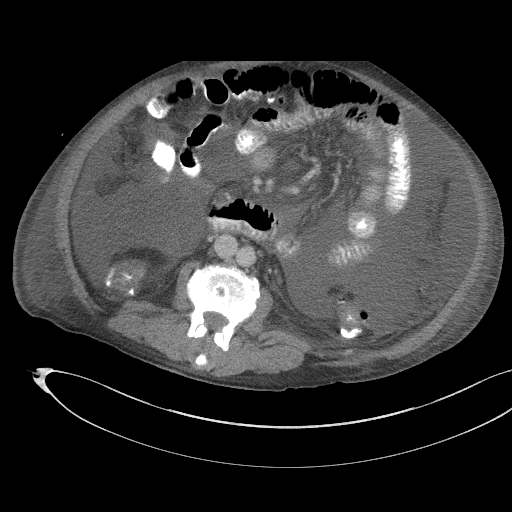
[im 55/105  soft-tissue]
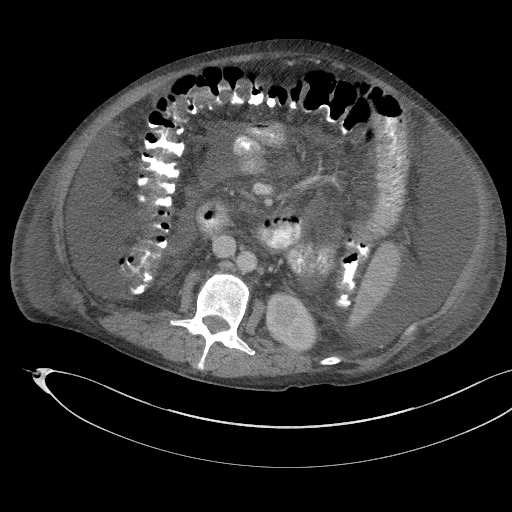
[im 66/105  soft-tissue]
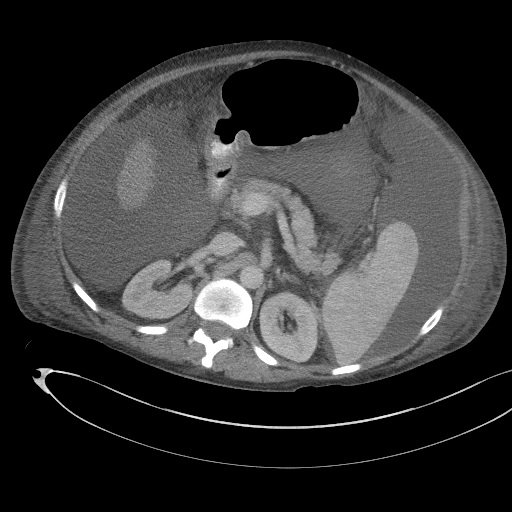
[im 72/105  soft-tissue]
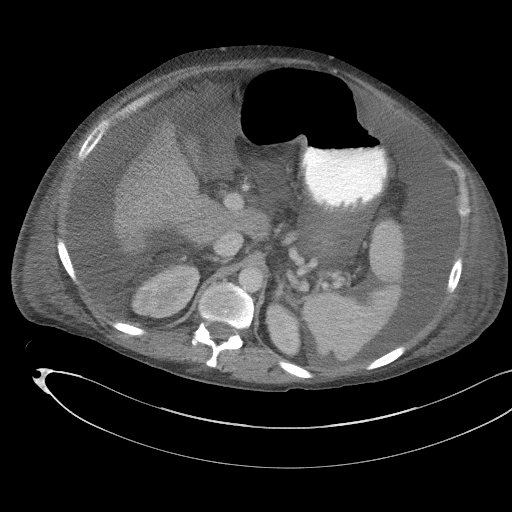
[im 72/105  bone]
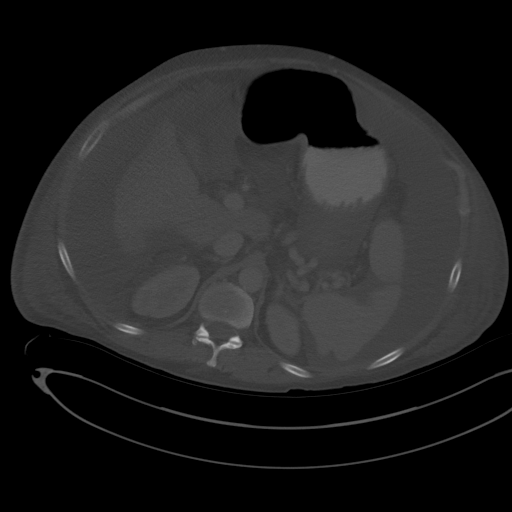
[im 83/105  soft-tissue]
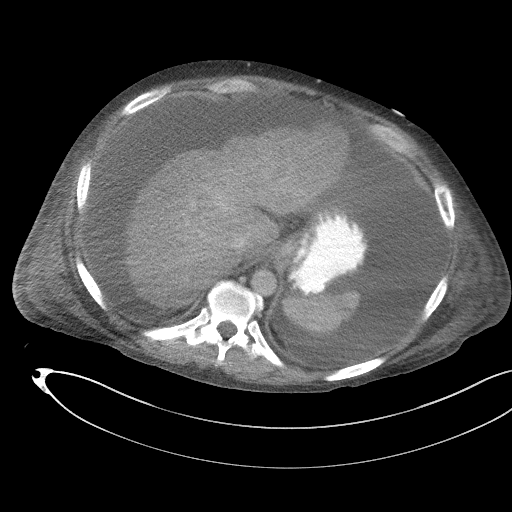
[im 88/105  soft-tissue]
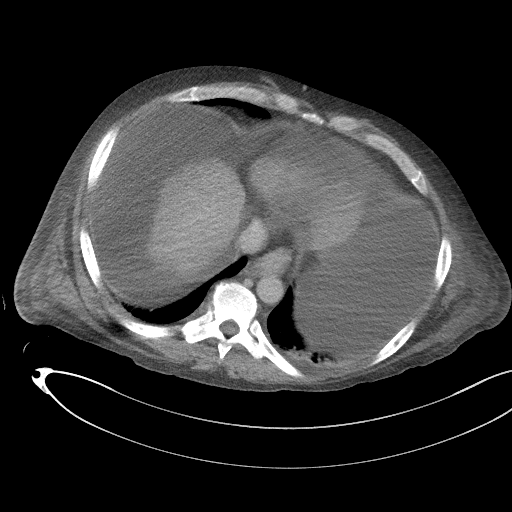
[im 99/105  soft-tissue]
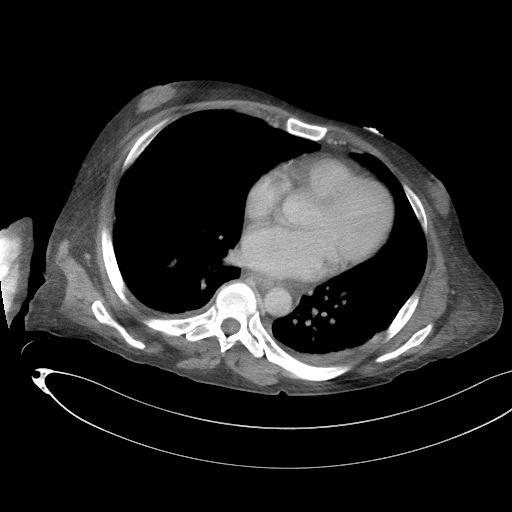

[Series 5: a/p w/ cor · coronal · 0.86mm/px · 3 of 151 slices shown]
[im 51/151  soft-tissue]
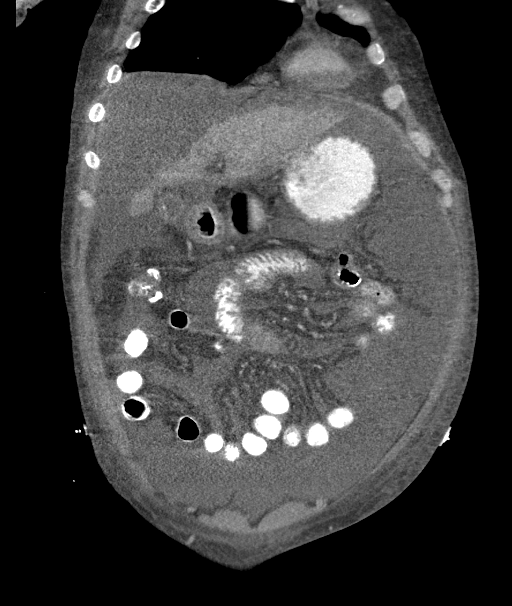
[im 67/151  soft-tissue]
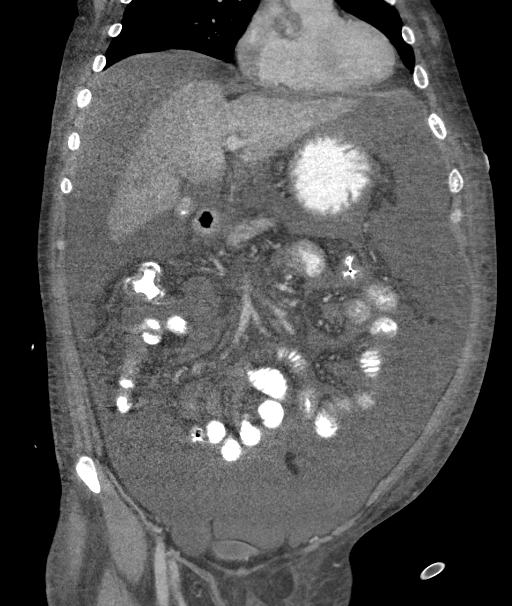
[im 84/151  soft-tissue]
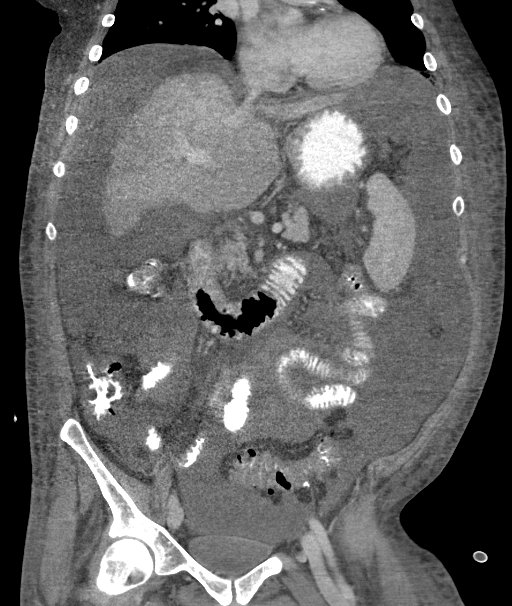

[15 of 46 positions shown; findings below may reference images not displayed]

FINDINGS: Trace bilateral pleural effusions are noted, with associated
atelectasis. Minimal bilateral gynecomastia is noted.

Large volume ascites is seen within the abdomen and pelvis.

There is a diffusely nodular contour of the liver, compatible with
hepatic cirrhosis. Underlying mass cannot be excluded, but is not
well assessed on this study. The spleen is enlarged, measuring
cm in length. Scattered stones are seen within the gallbladder. The
gallbladder is not well assessed given surrounding ascites. The
pancreas and adrenal glands are grossly unremarkable.

Splenic varices are noted.

The kidneys are unremarkable in appearance. There is no evidence of
hydronephrosis. No renal or ureteral stones are seen. No perinephric
stranding is appreciated.

The small bowel is unremarkable in appearance. The stomach is within
normal limits. No acute vascular abnormalities are seen.

The patient is status post appendectomy. Mild mucosal thickening is
suggested along the entirety of the colon, though this may simply
reflect surrounding ascites. Would correlate for any evidence of
enteropathy.

The bladder is mildly distended and grossly unremarkable. The
prostate remains normal in size. No inguinal lymphadenopathy is
seen.

Diffuse soft tissue edema is noted about the abdomen and pelvis,
concerning for mild anasarca.

No acute osseous abnormalities are identified.
IMPRESSION: 1. Large volume ascites within the abdomen and pelvis.
2. Diffusely nodular contour of the liver, compatible with hepatic
cirrhosis. Underlying mass cannot be excluded, but is not well
assessed on this study. Given the patient's elevated LFTs, this
could be further assessed on dynamic liver protocol MRI or CT, as
deemed clinically appropriate.
3. Mild mucosal thickening along the entirety of the colon may
simply reflect surrounding ascites. Would correlate for any evidence
of enteropathy.
4. Splenomegaly noted.
5. Cholelithiasis. Gallbladder not well assessed given surrounding
ascites.
6. Splenic varices noted.
7. Diffuse soft tissue edema about the abdomen and pelvis,
concerning for mild anasarca.
